# Patient Record
Sex: Female | Born: 1945 | Race: White | Hispanic: No | Smoking: Never smoker
Health system: Southern US, Community
[De-identification: ages and names within clinical notes are randomized; demographics above are authoritative.]

## PROBLEM LIST (undated history)

## (undated) DIAGNOSIS — F32A Depression, unspecified: Secondary | ICD-10-CM

## (undated) DIAGNOSIS — E538 Deficiency of other specified B group vitamins: Secondary | ICD-10-CM

## (undated) DIAGNOSIS — N200 Calculus of kidney: Secondary | ICD-10-CM

## (undated) DIAGNOSIS — F419 Anxiety disorder, unspecified: Secondary | ICD-10-CM

## (undated) DIAGNOSIS — G4730 Sleep apnea, unspecified: Secondary | ICD-10-CM

## (undated) DIAGNOSIS — F329 Major depressive disorder, single episode, unspecified: Secondary | ICD-10-CM

## (undated) DIAGNOSIS — Z9889 Other specified postprocedural states: Secondary | ICD-10-CM

## (undated) DIAGNOSIS — E785 Hyperlipidemia, unspecified: Secondary | ICD-10-CM

## (undated) HISTORY — PX: STONE EXTRACTION WITH BASKET: SHX5318

## (undated) HISTORY — DX: Major depressive disorder, single episode, unspecified: F32.9

## (undated) HISTORY — DX: Anxiety disorder, unspecified: F41.9

## (undated) HISTORY — DX: Hyperlipidemia, unspecified: E78.5

## (undated) HISTORY — DX: Depression, unspecified: F32.A

## (undated) HISTORY — DX: Deficiency of other specified B group vitamins: E53.8

---

## 1975-10-31 HISTORY — PX: TUBAL LIGATION: SHX77

## 1998-02-02 ENCOUNTER — Encounter: Admission: RE | Admit: 1998-02-02 | Discharge: 1998-05-03 | Payer: Self-pay | Admitting: Internal Medicine

## 1998-05-04 ENCOUNTER — Ambulatory Visit: Admission: RE | Admit: 1998-05-04 | Discharge: 1998-05-04 | Payer: Self-pay | Admitting: Obstetrics and Gynecology

## 1998-05-05 ENCOUNTER — Ambulatory Visit (HOSPITAL_COMMUNITY): Admission: RE | Admit: 1998-05-05 | Discharge: 1998-05-05 | Payer: Self-pay | Admitting: Obstetrics and Gynecology

## 1999-06-17 ENCOUNTER — Other Ambulatory Visit: Admission: RE | Admit: 1999-06-17 | Discharge: 1999-06-17 | Payer: Self-pay | Admitting: *Deleted

## 1999-07-08 ENCOUNTER — Ambulatory Visit (HOSPITAL_COMMUNITY): Admission: RE | Admit: 1999-07-08 | Discharge: 1999-07-08 | Payer: Self-pay | Admitting: Internal Medicine

## 1999-07-08 ENCOUNTER — Encounter: Payer: Self-pay | Admitting: Internal Medicine

## 2000-03-08 ENCOUNTER — Emergency Department (HOSPITAL_COMMUNITY): Admission: EM | Admit: 2000-03-08 | Discharge: 2000-03-08 | Payer: Self-pay | Admitting: Emergency Medicine

## 2000-03-09 ENCOUNTER — Encounter: Payer: Self-pay | Admitting: Emergency Medicine

## 2000-03-09 ENCOUNTER — Inpatient Hospital Stay (HOSPITAL_COMMUNITY): Admission: AD | Admit: 2000-03-09 | Discharge: 2000-03-11 | Payer: Self-pay | Admitting: Internal Medicine

## 2000-07-09 ENCOUNTER — Encounter: Payer: Self-pay | Admitting: Internal Medicine

## 2000-07-09 ENCOUNTER — Ambulatory Visit (HOSPITAL_COMMUNITY): Admission: RE | Admit: 2000-07-09 | Discharge: 2000-07-09 | Payer: Self-pay | Admitting: Internal Medicine

## 2000-07-18 ENCOUNTER — Other Ambulatory Visit: Admission: RE | Admit: 2000-07-18 | Discharge: 2000-07-18 | Payer: Self-pay | Admitting: *Deleted

## 2000-08-31 ENCOUNTER — Encounter: Admission: RE | Admit: 2000-08-31 | Discharge: 2000-11-29 | Payer: Self-pay | Admitting: Internal Medicine

## 2001-02-27 ENCOUNTER — Encounter: Payer: Self-pay | Admitting: Internal Medicine

## 2001-02-27 ENCOUNTER — Encounter: Admission: RE | Admit: 2001-02-27 | Discharge: 2001-02-27 | Payer: Self-pay | Admitting: Internal Medicine

## 2001-03-14 ENCOUNTER — Encounter: Payer: Self-pay | Admitting: Internal Medicine

## 2001-03-14 ENCOUNTER — Encounter: Admission: RE | Admit: 2001-03-14 | Discharge: 2001-03-14 | Payer: Self-pay | Admitting: Internal Medicine

## 2001-03-19 ENCOUNTER — Encounter: Admission: RE | Admit: 2001-03-19 | Discharge: 2001-05-29 | Payer: Self-pay | Admitting: Internal Medicine

## 2001-07-11 ENCOUNTER — Encounter: Payer: Self-pay | Admitting: Internal Medicine

## 2001-07-11 ENCOUNTER — Ambulatory Visit (HOSPITAL_COMMUNITY): Admission: RE | Admit: 2001-07-11 | Discharge: 2001-07-11 | Payer: Self-pay | Admitting: Internal Medicine

## 2001-07-18 ENCOUNTER — Other Ambulatory Visit: Admission: RE | Admit: 2001-07-18 | Discharge: 2001-07-18 | Payer: Self-pay | Admitting: Obstetrics and Gynecology

## 2002-07-15 ENCOUNTER — Ambulatory Visit (HOSPITAL_COMMUNITY): Admission: RE | Admit: 2002-07-15 | Discharge: 2002-07-15 | Payer: Self-pay | Admitting: Internal Medicine

## 2002-07-15 ENCOUNTER — Encounter: Payer: Self-pay | Admitting: Internal Medicine

## 2002-09-15 ENCOUNTER — Other Ambulatory Visit: Admission: RE | Admit: 2002-09-15 | Discharge: 2002-09-15 | Payer: Self-pay | Admitting: Obstetrics and Gynecology

## 2003-05-14 ENCOUNTER — Emergency Department (HOSPITAL_COMMUNITY): Admission: EM | Admit: 2003-05-14 | Discharge: 2003-05-14 | Payer: Self-pay | Admitting: Emergency Medicine

## 2003-05-14 ENCOUNTER — Encounter: Payer: Self-pay | Admitting: Emergency Medicine

## 2003-07-27 ENCOUNTER — Ambulatory Visit (HOSPITAL_COMMUNITY): Admission: RE | Admit: 2003-07-27 | Discharge: 2003-07-27 | Payer: Self-pay | Admitting: Internal Medicine

## 2003-07-27 ENCOUNTER — Encounter: Payer: Self-pay | Admitting: Internal Medicine

## 2003-11-19 ENCOUNTER — Encounter: Admission: RE | Admit: 2003-11-19 | Discharge: 2003-11-19 | Payer: Self-pay | Admitting: Internal Medicine

## 2004-07-13 ENCOUNTER — Ambulatory Visit (HOSPITAL_COMMUNITY): Admission: RE | Admit: 2004-07-13 | Discharge: 2004-07-14 | Payer: Self-pay | Admitting: Internal Medicine

## 2004-08-02 ENCOUNTER — Encounter: Admission: RE | Admit: 2004-08-02 | Discharge: 2004-10-31 | Payer: Self-pay | Admitting: Internal Medicine

## 2004-08-23 ENCOUNTER — Ambulatory Visit (HOSPITAL_COMMUNITY): Admission: RE | Admit: 2004-08-23 | Discharge: 2004-08-23 | Payer: Self-pay | Admitting: Internal Medicine

## 2004-08-26 ENCOUNTER — Ambulatory Visit (HOSPITAL_BASED_OUTPATIENT_CLINIC_OR_DEPARTMENT_OTHER): Admission: RE | Admit: 2004-08-26 | Discharge: 2004-08-26 | Payer: Self-pay | Admitting: Internal Medicine

## 2004-10-30 DIAGNOSIS — Z9889 Other specified postprocedural states: Secondary | ICD-10-CM

## 2004-10-30 HISTORY — DX: Other specified postprocedural states: Z98.890

## 2004-11-18 ENCOUNTER — Encounter: Admission: RE | Admit: 2004-11-18 | Discharge: 2004-11-18 | Payer: Self-pay | Admitting: Internal Medicine

## 2005-08-17 ENCOUNTER — Encounter: Admission: RE | Admit: 2005-08-17 | Discharge: 2005-10-29 | Payer: Self-pay | Admitting: Internal Medicine

## 2005-08-30 ENCOUNTER — Ambulatory Visit (HOSPITAL_COMMUNITY): Admission: RE | Admit: 2005-08-30 | Discharge: 2005-08-30 | Payer: Self-pay | Admitting: Internal Medicine

## 2005-10-24 ENCOUNTER — Emergency Department (HOSPITAL_COMMUNITY): Admission: EM | Admit: 2005-10-24 | Discharge: 2005-10-24 | Payer: Self-pay | Admitting: Emergency Medicine

## 2006-03-05 ENCOUNTER — Emergency Department (HOSPITAL_COMMUNITY): Admission: EM | Admit: 2006-03-05 | Discharge: 2006-03-05 | Payer: Self-pay | Admitting: Family Medicine

## 2006-08-31 ENCOUNTER — Ambulatory Visit (HOSPITAL_COMMUNITY): Admission: RE | Admit: 2006-08-31 | Discharge: 2006-08-31 | Payer: Self-pay | Admitting: Internal Medicine

## 2006-09-27 ENCOUNTER — Emergency Department (HOSPITAL_COMMUNITY): Admission: EM | Admit: 2006-09-27 | Discharge: 2006-09-27 | Payer: Self-pay | Admitting: Emergency Medicine

## 2006-10-15 ENCOUNTER — Ambulatory Visit (HOSPITAL_COMMUNITY): Admission: RE | Admit: 2006-10-15 | Discharge: 2006-10-15 | Payer: Self-pay | Admitting: Orthopaedic Surgery

## 2006-11-02 ENCOUNTER — Encounter: Admission: RE | Admit: 2006-11-02 | Discharge: 2007-01-31 | Payer: Self-pay | Admitting: Orthopaedic Surgery

## 2007-01-18 ENCOUNTER — Ambulatory Visit (HOSPITAL_COMMUNITY): Admission: RE | Admit: 2007-01-18 | Discharge: 2007-01-18 | Payer: Self-pay | Admitting: Orthopaedic Surgery

## 2007-02-15 ENCOUNTER — Other Ambulatory Visit: Admission: RE | Admit: 2007-02-15 | Discharge: 2007-02-15 | Payer: Self-pay | Admitting: Internal Medicine

## 2007-11-13 ENCOUNTER — Emergency Department (HOSPITAL_COMMUNITY): Admission: EM | Admit: 2007-11-13 | Discharge: 2007-11-13 | Payer: Self-pay | Admitting: Emergency Medicine

## 2008-02-03 ENCOUNTER — Ambulatory Visit (HOSPITAL_COMMUNITY): Admission: RE | Admit: 2008-02-03 | Discharge: 2008-02-03 | Payer: Self-pay | Admitting: Ophthalmology

## 2008-08-07 ENCOUNTER — Ambulatory Visit: Payer: Self-pay | Admitting: Internal Medicine

## 2008-08-21 ENCOUNTER — Ambulatory Visit: Payer: Self-pay | Admitting: Internal Medicine

## 2008-10-30 HISTORY — PX: CATARACT EXTRACTION W/ INTRAOCULAR LENS  IMPLANT, BILATERAL: SHX1307

## 2008-10-30 HISTORY — PX: OTHER SURGICAL HISTORY: SHX169

## 2008-11-17 ENCOUNTER — Emergency Department (HOSPITAL_COMMUNITY): Admission: EM | Admit: 2008-11-17 | Discharge: 2008-11-17 | Payer: Self-pay | Admitting: Family Medicine

## 2008-11-23 ENCOUNTER — Emergency Department (HOSPITAL_COMMUNITY): Admission: EM | Admit: 2008-11-23 | Discharge: 2008-11-23 | Payer: Self-pay | Admitting: Emergency Medicine

## 2008-12-17 ENCOUNTER — Ambulatory Visit: Payer: Self-pay | Admitting: Internal Medicine

## 2009-10-23 ENCOUNTER — Ambulatory Visit: Payer: Self-pay | Admitting: Diagnostic Radiology

## 2009-10-23 ENCOUNTER — Emergency Department (HOSPITAL_BASED_OUTPATIENT_CLINIC_OR_DEPARTMENT_OTHER): Admission: EM | Admit: 2009-10-23 | Discharge: 2009-10-23 | Payer: Self-pay | Admitting: Emergency Medicine

## 2009-10-25 ENCOUNTER — Ambulatory Visit: Payer: Self-pay | Admitting: Internal Medicine

## 2009-10-30 HISTORY — PX: LEG SURGERY: SHX1003

## 2009-12-02 ENCOUNTER — Ambulatory Visit: Payer: Self-pay | Admitting: Internal Medicine

## 2010-02-10 ENCOUNTER — Emergency Department (HOSPITAL_COMMUNITY): Admission: EM | Admit: 2010-02-10 | Discharge: 2010-02-10 | Payer: Self-pay | Admitting: Emergency Medicine

## 2010-02-14 ENCOUNTER — Ambulatory Visit: Payer: Self-pay | Admitting: Internal Medicine

## 2010-02-17 ENCOUNTER — Ambulatory Visit (HOSPITAL_BASED_OUTPATIENT_CLINIC_OR_DEPARTMENT_OTHER): Admission: RE | Admit: 2010-02-17 | Discharge: 2010-02-18 | Payer: Self-pay | Admitting: Orthopedic Surgery

## 2010-05-18 ENCOUNTER — Encounter: Admission: RE | Admit: 2010-05-18 | Discharge: 2010-08-15 | Payer: Self-pay | Admitting: Orthopedic Surgery

## 2010-11-20 ENCOUNTER — Encounter: Payer: Self-pay | Admitting: Internal Medicine

## 2010-12-05 ENCOUNTER — Other Ambulatory Visit: Payer: Commercial Managed Care - PPO | Admitting: Internal Medicine

## 2010-12-06 ENCOUNTER — Encounter (INDEPENDENT_AMBULATORY_CARE_PROVIDER_SITE_OTHER): Payer: Commercial Managed Care - PPO | Admitting: Internal Medicine

## 2010-12-06 DIAGNOSIS — F329 Major depressive disorder, single episode, unspecified: Secondary | ICD-10-CM

## 2010-12-06 DIAGNOSIS — E119 Type 2 diabetes mellitus without complications: Secondary | ICD-10-CM

## 2010-12-06 DIAGNOSIS — E785 Hyperlipidemia, unspecified: Secondary | ICD-10-CM

## 2011-01-03 ENCOUNTER — Ambulatory Visit (INDEPENDENT_AMBULATORY_CARE_PROVIDER_SITE_OTHER): Payer: Commercial Managed Care - PPO | Admitting: Internal Medicine

## 2011-01-03 DIAGNOSIS — F329 Major depressive disorder, single episode, unspecified: Secondary | ICD-10-CM

## 2011-01-03 DIAGNOSIS — E119 Type 2 diabetes mellitus without complications: Secondary | ICD-10-CM

## 2011-01-17 LAB — POCT I-STAT, CHEM 8
BUN: 18 mg/dL (ref 6–23)
Chloride: 110 mEq/L (ref 96–112)
Creatinine, Ser: 0.8 mg/dL (ref 0.4–1.2)
Potassium: 4 mEq/L (ref 3.5–5.1)
Sodium: 139 mEq/L (ref 135–145)

## 2011-01-17 LAB — GLUCOSE, CAPILLARY
Glucose-Capillary: 191 mg/dL — ABNORMAL HIGH (ref 70–99)
Glucose-Capillary: 229 mg/dL — ABNORMAL HIGH (ref 70–99)

## 2011-01-18 LAB — GLUCOSE, CAPILLARY: Glucose-Capillary: 181 mg/dL — ABNORMAL HIGH (ref 70–99)

## 2011-01-30 LAB — CBC
Platelets: 293 10*3/uL (ref 150–400)
RDW: 12.2 % (ref 11.5–15.5)
WBC: 8.3 10*3/uL (ref 4.0–10.5)

## 2011-01-30 LAB — GLUCOSE, CAPILLARY
Glucose-Capillary: 298 mg/dL — ABNORMAL HIGH (ref 70–99)
Glucose-Capillary: 394 mg/dL — ABNORMAL HIGH (ref 70–99)

## 2011-01-30 LAB — URINE CULTURE

## 2011-01-30 LAB — URINALYSIS, ROUTINE W REFLEX MICROSCOPIC
Hgb urine dipstick: NEGATIVE
Leukocytes, UA: NEGATIVE
Nitrite: NEGATIVE
Protein, ur: NEGATIVE mg/dL
Urobilinogen, UA: 0.2 mg/dL (ref 0.0–1.0)

## 2011-01-30 LAB — BASIC METABOLIC PANEL
BUN: 13 mg/dL (ref 6–23)
Calcium: 9 mg/dL (ref 8.4–10.5)
Creatinine, Ser: 0.7 mg/dL (ref 0.4–1.2)
GFR calc non Af Amer: >60 mL/min (ref >60)
Glucose, Bld: 384 mg/dL — ABNORMAL HIGH (ref 70–99)

## 2011-01-30 LAB — DIFFERENTIAL
Basophils Absolute: 0.1 10*3/uL (ref 0.0–0.1)
Eosinophils Relative: 1 % (ref 0–5)
Lymphocytes Relative: 21 % (ref 12–46)
Lymphs Abs: 1.7 10*3/uL (ref 0.7–4.0)
Neutro Abs: 5.6 10*3/uL (ref 1.7–7.7)
Neutrophils Relative %: 67 % (ref 43–77)

## 2011-01-30 LAB — URINE MICROSCOPIC-ADD ON

## 2011-02-09 ENCOUNTER — Other Ambulatory Visit: Payer: Commercial Managed Care - PPO | Admitting: Internal Medicine

## 2011-02-10 ENCOUNTER — Ambulatory Visit (INDEPENDENT_AMBULATORY_CARE_PROVIDER_SITE_OTHER): Payer: Commercial Managed Care - PPO | Admitting: Internal Medicine

## 2011-02-10 DIAGNOSIS — E119 Type 2 diabetes mellitus without complications: Secondary | ICD-10-CM

## 2011-03-17 NOTE — Discharge Summary (Signed)
NAME:  Diana George, Diana George                          ACCOUNT NO.:  [id number]   MEDICAL RECORD NO.:  [id number]                   PATIENT TYPE:  OIB   LOCATION:  3742                                 FACILITY:  [facility]   PHYSICIAN:  Doylene Canning. Ladona Ridgel, M.D.               DATE OF BIRTH:  06/02/1946   DATE OF ADMISSION:  07/13/2004  DATE OF DISCHARGE:  07/14/2004                                 DISCHARGE SUMMARY   DISCHARGE DIAGNOSES:  1.  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  2.  Electrophysiology surgery with radiofrequency ablation of the      atrioventricular node reentrant tachycardia with successful P wave      modification, rendering supraventricular tachycardia non-inducible.   SECONDARY DIAGNOSIS:  Dyslipidemia.   PROCEDURE:  July 13, 2004, radiofrequency ablation of atrioventricular  nodal reentrant tachycardia with P wave modification, supraventricular  tachycardia non-inducible at end of study, Dr. Doylene Canning. Ladona Ridgel,  electrophysiologist.   DISCHARGE DISPOSITION:  Diana George is ready for discharge, post-  procedure day #1.  She has had no recurrence of tachycardia since the  procedure.  She is feeling well.   DISCHARGE MEDICATIONS:  She goes home with:  1.  Enteric-coated aspirin 81 mg daily.  2.  Clorazepate 7.5 mg daily.  3.  Zoloft 100 mg daily.  4.  Multivitamin daily.   ACTIVITY:  She is asked not to engage in any heavy lifting or straining for  the next 2 days.   DISCHARGE INSTRUCTIONS:  If she plans a dental visit or even a teeth-  cleaning or minor surgery before January 10, 2005, she is to call [phone] for  antibiotic coverage.   PAIN MANAGEMENT:  Tylenol for pain at the catheterization site, 1-2 tabs  every 4-6 hours as needed.   SPECIAL DISCHARGE INSTRUCTIONS:  She is asked not to drive until Saturday,  July 16, 2004.  She will go back to work on Monday, July 18, 2004.   FOLLOWUP:  She has a followup visit with Dr.  Ladona Ridgel, Thursday, August 25, 2004, at 10:45 in the morning.   BRIEF HISTORY:  Diana George is a 65 year old female.  She has a history of  recurrent tachy-palpitations which have stretched back to when she was in  college.  At that time, she noted she had palpitations; she would lie down,  raise her legs and tachy-palpitations would respond immediately and  terminate.  Over the years, she has had recurrent episodes, but always self-  terminating.  Several weeks ago, she had a recurrence, but did not terminate  spontaneously or with vagal maneuvers.  She went to the emergency room and  underwent IV adenosine infusion, which terminated the tachycardia.  Since  then, the patient has been given Lopressor to take as needed.  Since this  visit to the emergency room, she has had no additional prolonged tachy-  palpitation.  She is  now referred for additional evaluation.  Treatment  options have been discussed with the patient by Dr. Ladona Ridgel including  catheter ablation;  the patient will decide whether continued medical  therapy with Lopressor or ablation will be best suitable for her.   HOSPITAL COURSE:  The patient presents electively, July 13, 2004, for  radiofrequency ablation of an A-V node reentrant tachycardia; this was done  successfully by Dr. Ladona Ridgel.  She has had no post-procedure complications, no  recurrence of SVT.      Diana George, P.A.                    Doylene Canning. Ladona Ridgel, M.D.    GM/MEDQ  D:  07/14/2004  T:  07/14/2004  Job:  [id number]   cc:   Luanna Cole. Lenord Fellers, M.D.  [address] [place]  [place]  [place] [address]  Fax: [phone]

## 2011-03-17 NOTE — Procedures (Signed)
NAME:  Diana George, Diana George                ACCOUNT NO.:  [id number]   MEDICAL RECORD NO.:  [id number]          PATIENT TYPE:  OUT   LOCATION:  SLEEP CENTER                 FACILITY[facility]   PHYSICIAN:  Clinton D. Maple Hudson, M.D. DATE OF BIRTH:  Jun 07, 1946   DATE OF STUDY:  08/26/2004                              NOCTURNAL POLYSOMNOGRAM   STUDY DATE:  August 26, 2004   REFERRING PHYSICIAN:  Clinton D. Maple Hudson, M.D.   INDICATION FOR STUDY:  Hypersomnia with sleep apnea.   EPWORTH SLEEPINESS SCORE:  11/24   BODY MASS INDEX:  29.9   WEIGHT:  191 pounds   SLEEP ARCHITECTURE:  Total sleep time 365 minutes with sleep efficiency 85%.  Stage I was 13%, stage II 85%, stages III and IV were absent, REM was 2% of  total sleep time.  Sleep latency 10 minutes, REM latency 398 minutes, awake  after sleep onset 56 minutes.  Arousal index 25.8 which is increased and  reflected both respiratory events and leg jerks.  She listed medications  including clorazepate and Zoloft but technician did not indicate if these  were taken the night of the study.   RESPIRATORY DATA:  RDI 13.8/hr consistent with mild obstructive sleep  apnea/hypopnea syndrome.  This reflected 19 obstructive apneas and 65  hypopneas.  Events were not positional.  REM RDI 6.7/hr.   OXYGEN DATA:  Variable snoring with desaturation to a nadir of 85% with  apneas.  Mean oxygen saturation through the study was 95-96% on room air.   CARDIAC DATA:  Normal sinus rhythm.   MOVEMENT/PARASOMNIA:  Periodic limb movement syndrome with arousal indicated  by a total of 280 limb jerks of which 42 were associated with arousal or  awakening for a periodic limb movement with arousal index of 6.9/hr.   IMPRESSION/RECOMMENDATION:  Mild obstructive sleep apnea/hypopnea syndrome,  respiratory disturbance index 13.8/hr with desaturation to 85%.  She had not  met protocol guideline for split-study protocol.  Periodic limb movement  with arousal,  6.9/hr.                                                           Joni Fears D. Maple Hudson, M.D.  Diplomate, [employer] Board   CDY/MEDQ  D:  09/04/2004 11:00:50  T:  09/05/2004 10:21:55  Job:  [id number]

## 2011-03-17 NOTE — Op Note (Signed)
NAME:  Diana George, Diana George                          ACCOUNT NO.:  [id number]   MEDICAL RECORD NO.:  [id number]                   PATIENT TYPE:  OIB   LOCATION:  2856                                 FACILITY:  [facility]   PHYSICIAN:  Doylene Canning. Ladona Ridgel, M.D.               DATE OF BIRTH:  1946-09-06   DATE OF PROCEDURE:  07/13/2004  DATE OF DISCHARGE:                                 OPERATIVE REPORT   PROCEDURE PERFORMED:  Electrophysiologic study and radio frequency catheter  ablation of atrioventricular node re-entrant tachycardia.   INTRODUCTION:  The patient is a 65 year old woman with a history of  recurrent tachy palpitations for several years.  They start and stop  suddenly and have been documented and demonstrated to be due to a narrow QRS  tachycardia at 180 beats per minute.  This tachycardia typically stops with  adenosine.  The patient has done well for over a year, but has over the last  several weeks developed recurrent episodes of tachy palpitations having  seven distinct episodes occur in the last several days.  She is now referred  for electrophysiologic study and catheter ablation.   DESCRIPTION OF PROCEDURE:  After informed consent was obtained, the patient  was taken to the diagnostic electrophysiology laboratory in a fasted state.  After the usual preparation and draping, intravenous fentanyl and Midazolam  was given for sedation.  A 6 French hexapolar catheter was inserted  percutaneously into the right jugular vein and advanced to the coronary  sinus.  A 5 French quadripolar catheter was inserted percutaneously into the  right femoral vein and advanced to the right ventricular apex.  A 5 French  quadripolar catheter was inserted percutaneously into the right femoral vein  and advanced to the His bundle region.  After measurement of the basic  intervals, rapid ventricular pacing was carried out from the right  ventricular apex demonstrating a VA Wenckebach cycle length of 340  msec.  During rapid ventricular pacing, the atrial activation was midline and  decremental.  Next, programmed ventricular stimulation was carried out from  the RV apex at a basic drive cycle length of 161 msec.  The S1-S2 interval  was stepwise decreased from 440 msec down to 250 msec where ventricular  refractoriness was observed.  During programmed ventricular stimulation, the  atrial activation was midline and decremental.  Next, programmed atrial  stimulation was carried out from the coronary sinus as well as the high  right atrium with basic drive cycle lengths of 096 and 400 msec.  The S1-S2  interval was stepwise decreased from 440 msec down to 260 msec where atrial  refractoriness was observed.  During programmed atrial stimulation there  were multiple AH jumps and echo beats but initially no inducible SVT.  Rapid  atrial pacing was carried out from the coronary sinus as well as the high  right atrium with pace cycle lengths of 600  msec and stepwise decreased down  to 390 msec.  During rapid atrial pacing the P-R interval was greater than  the R-R interval but there was no inducible SVT.  At this point  isoproterenol at concentrations of 1 to 2 mcg per minute were then infused  and additional rapid atrial pacing was carried out which resulted in the  initiation of SVT.  During SVT the cycle length was 334 msec.  After some  time, the cycle length accelerated to 303 msec.  PVCs were placed at the  time of His bundle refractoriness during tachycardia demonstrating no atrial  pre excitation.  In addition there was a VAV conduction sequence in  tachycardia.  Finally mapping demonstrated the earliest atrial activation  occurring in the his bundle region.  The VA interval was essentially 0.  All  of the above findings demonstrated a diagnosis of AV node re-entry  tachycardia.  The ablation catheter was maneuvered into the usual region of  Koch's triangle.  A total of 4 radio frequency  energy applications were  delivered to sites 6 through 8 in Koch's triangle resulting in creation of  accelerated junctional rhythm.  Following this, additional isoproterenol was  infused and additional rapid atrial and ventricular pacing carried out and  this demonstrated no evidence of inducible tachycardia.  In addition, there  was no evidence of any residual slow pathway conduction.  At this point the  catheters were removed.  Hemostasis was assured and the patient was returned  to her room in satisfactory condition.   COMPLICATIONS:  There were no immediate procedural complications.   RESULTS:  a.  Baseline electrocardiogram.  The baseline ECG demonstrates  normal sinus rhythm with normal axis and intervals.  b.  Baseline intervals.  The P-R interval was 150 msec.  The QRS duration  980 msec.  The HV interval 49 msec.  c.  Rapid ventricular pacing.  Rapid ventricular pacing was carried out from  the right ventricular apex demonstrating a VA Wenckebach cycle length of 340  msec.  During rapid ventricular pacing the atrial activation was midline and  decremental.  d.  Programmed ventricular stimulation.  Programmed ventricular stimulation  was carried out from the RV apex at basic drive cycle lengths of 478 msec.  The S1-S2 interval was stepwise decreased down to 250 msec resulting in  ventricular refractoriness.  During programmed ventricular stimulation, the  atrial activation was midline and decremental.  e.  Rapid atrial pacing.  Rapid atrial pacing was carried out from the high  right atrium as well as the coronary sinus with pace cycle length of 600  msec, stepwise decreased down to AV Wenckebach. During rapid atrial pacing  with isoproterenol there was inducible SVT as previously described.  f.  Programmed atrial stimulation.  Programmed atrial stimulation was  carried out from the coronary sinus as well as the high right atrium with basic drive cycle length of 295 msec.  The  S1-S2 interval was stepwise  decreased from 440 msec down to 260 msec resulting in atrial refractoriness.  During programmed atrial stimulation there were multiple A-H jumps and echo  beats.  Following catheter ablation there were neither A-H jumps or echo  beats remaining.  g.  Arrhythmias observed.  AV node re-entry tachycardia.  Initiation rapid  atrial pacing on isoproterenol.  Duration was sustained.  Cycle length was  300 to 330 msec.  h.  Mapping.  Mapping of Koch's triangle demonstrated the usual size and  orientation.  1.  Radio frequency energy application.  A total of four radio frequency      energy applications were delivered to the usual sites in Koch's triangle      between sites 6 and 8.  During RF energy application, there was      accelerated junctional rhythm.  Following ablation there was no residual      slow pathway conduction.   CONCLUSION:  This study demonstrated successful electrophysiologic study and  radio frequency catheter ablation of AV node re-entry tachycardia with a  total of four RF energy applications delivered to sites 6 through 8 in  Koch's triangle, resulting in rendering of the tachycardia noninducible.                                               Doylene Canning. Ladona Ridgel, M.D.    GWT/MEDQ  D:  07/13/2004  T:  07/13/2004  Job:  [id number]   cc:   Charlton Haws, M.D.   Luanna Cole. Lenord Fellers, M.D.  [address], [place]  [place]  [place] [address]  Fax: [phone]

## 2011-04-16 ENCOUNTER — Inpatient Hospital Stay (INDEPENDENT_AMBULATORY_CARE_PROVIDER_SITE_OTHER)
Admission: RE | Admit: 2011-04-16 | Discharge: 2011-04-16 | Disposition: A | Discharge status: Home / Self Care | Payer: Commercial Managed Care - PPO | Source: Ambulatory Visit | Attending: Family Medicine | Admitting: Family Medicine

## 2011-04-16 DIAGNOSIS — J309 Allergic rhinitis, unspecified: Secondary | ICD-10-CM

## 2011-05-30 ENCOUNTER — Other Ambulatory Visit: Payer: Self-pay | Admitting: Internal Medicine

## 2011-06-09 ENCOUNTER — Encounter: Payer: Self-pay | Admitting: Internal Medicine

## 2011-06-12 ENCOUNTER — Other Ambulatory Visit: Payer: Commercial Managed Care - PPO | Admitting: Internal Medicine

## 2011-06-12 DIAGNOSIS — E785 Hyperlipidemia, unspecified: Secondary | ICD-10-CM

## 2011-06-13 LAB — HEPATIC FUNCTION PANEL
Albumin: 4.3 g/dL (ref 3.5–5.2)
Alkaline Phosphatase: 77 U/L (ref 39–117)
Bilirubin, Direct: 0.1 mg/dL (ref 0.0–0.3)
Total Bilirubin: 0.4 mg/dL (ref 0.3–1.2)

## 2011-06-13 LAB — HEMOGLOBIN A1C: Hgb A1c MFr Bld: 6.6 % — ABNORMAL HIGH (ref <5.7)

## 2011-06-13 LAB — LIPID PANEL
HDL: 43 mg/dL (ref >39)
LDL Cholesterol: 88 mg/dL (ref 0–99)

## 2011-06-16 ENCOUNTER — Ambulatory Visit (INDEPENDENT_AMBULATORY_CARE_PROVIDER_SITE_OTHER): Payer: Commercial Managed Care - PPO | Admitting: Internal Medicine

## 2011-06-16 ENCOUNTER — Encounter: Payer: Self-pay | Admitting: Internal Medicine

## 2011-06-16 DIAGNOSIS — F419 Anxiety disorder, unspecified: Secondary | ICD-10-CM | POA: Insufficient documentation

## 2011-06-16 DIAGNOSIS — E119 Type 2 diabetes mellitus without complications: Secondary | ICD-10-CM | POA: Insufficient documentation

## 2011-06-16 DIAGNOSIS — E785 Hyperlipidemia, unspecified: Secondary | ICD-10-CM

## 2011-06-16 DIAGNOSIS — F341 Dysthymic disorder: Secondary | ICD-10-CM

## 2011-06-16 DIAGNOSIS — F329 Major depressive disorder, single episode, unspecified: Secondary | ICD-10-CM

## 2011-06-16 DIAGNOSIS — F32A Depression, unspecified: Secondary | ICD-10-CM

## 2011-06-16 NOTE — Progress Notes (Signed)
  Subjective:    Patient ID: Diana George, female    DOB: 04-26-1946, 65 y.o.   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 Has been going to [facility] at the hospital for dietary counseling and management of her diabetes. Hemoglobin A1c today is 6.6% which is markedly improved from 7.3% in April and previously 10.6% February 2012. Lipids are now normal. Liver functions are normal. She's compliant with her medications. She feels better. Elderly parents are in assisted living. Both parents have been diagnosed with dementia and this is stressful. She is going to retire January 2013.    Review of Systems     Objective:   Physical Exam chest clear to auscultation, cardiac exam regular rate and rhythm normal S1 and S2; extremities without edema. No diabetic foot issues. Pulses are normal.   Reminded about diabetic eye exam     Assessment & Plan:  AODM  Hyperlipidemia  Anxiety depression  Return in 4 months for fasting lipid panel liver functions and hemoglobin A1c along with office visit

## 2011-07-17 ENCOUNTER — Other Ambulatory Visit: Payer: Self-pay | Admitting: Internal Medicine

## 2011-09-06 ENCOUNTER — Other Ambulatory Visit: Payer: Self-pay | Admitting: Internal Medicine

## 2011-09-11 ENCOUNTER — Other Ambulatory Visit: Payer: Self-pay

## 2011-09-11 MED ORDER — CLORAZEPATE DIPOTASSIUM 3.75 MG PO TABS: 3.7500 mg | ORAL_TABLET | Freq: Two times a day (BID) | ORAL | Status: DC

## 2011-10-26 ENCOUNTER — Other Ambulatory Visit: Payer: Commercial Managed Care - PPO | Admitting: Internal Medicine

## 2011-10-26 DIAGNOSIS — Z79899 Other long term (current) drug therapy: Secondary | ICD-10-CM

## 2011-10-26 DIAGNOSIS — E785 Hyperlipidemia, unspecified: Secondary | ICD-10-CM

## 2011-10-26 LAB — HEPATIC FUNCTION PANEL
ALT: 11 U/L (ref 0–35)
AST: 15 U/L (ref 0–37)
Albumin: 4.4 g/dL (ref 3.5–5.2)
Total Bilirubin: 0.5 mg/dL (ref 0.3–1.2)

## 2011-10-26 LAB — LIPID PANEL
HDL: 48 mg/dL (ref >39)
Total CHOL/HDL Ratio: 2.8 Ratio
VLDL: 18 mg/dL (ref 0–40)

## 2011-10-26 LAB — HEMOGLOBIN A1C: Hgb A1c MFr Bld: 6.1 % — ABNORMAL HIGH (ref <5.7)

## 2011-10-27 ENCOUNTER — Ambulatory Visit (INDEPENDENT_AMBULATORY_CARE_PROVIDER_SITE_OTHER): Payer: Commercial Managed Care - PPO | Admitting: Internal Medicine

## 2011-10-27 ENCOUNTER — Encounter: Payer: Self-pay | Admitting: Internal Medicine

## 2011-10-27 DIAGNOSIS — F341 Dysthymic disorder: Secondary | ICD-10-CM

## 2011-10-27 DIAGNOSIS — F329 Major depressive disorder, single episode, unspecified: Secondary | ICD-10-CM

## 2011-10-27 DIAGNOSIS — F32A Depression, unspecified: Secondary | ICD-10-CM

## 2011-10-27 DIAGNOSIS — E119 Type 2 diabetes mellitus without complications: Secondary | ICD-10-CM

## 2011-10-27 DIAGNOSIS — E785 Hyperlipidemia, unspecified: Secondary | ICD-10-CM

## 2011-10-28 NOTE — Progress Notes (Signed)
  Subjective:    Patient ID: Diana George, female    DOB: May 21, 1946, 65 y.o.   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 She has been doing better with regard to diet. Am pleased with her results today. These were reviewed with her. Lipid panel is entirely normal. Hemoglobin A1c has improved significantly. Still has issues taking care of elderly parents. She will be retiring soon from her job and hopefully will have more time to take care of herself. However has made very good progress in the past 4 months. Says she's making healthy diet choices.    Review of Systems     Objective:   Physical Exam chest clear to auscultation; cardiac exam: regular rate and rhythm; extremities: without edema. Diabetic foot exam: shows no ulcers and no evidence of fungal infection. Has dry heels. Have recommended moisturizer for the dry heels        Assessment & Plan:  Diabetes mellitus  Hyperlipidemia  Anxiety depression  Plan: Urine sent for microalbumin; has seen Dr. Dagoberto Ligas for diabetic eye exam 2012; had influenza immunization at work. Tdap vaccine given today. Prescription given for Zostavax vaccine to be obtained at drugstore. Return in 6 months for physical exam.

## 2011-10-28 NOTE — Patient Instructions (Signed)
Please keep up the good work with diet and exercise. Am pleased with your progress with regard to diabetic control and lipid management. Return in 6 months for physical exam. Congratulations on your retirement.

## 2011-11-12 DIAGNOSIS — F411 Generalized anxiety disorder: Secondary | ICD-10-CM

## 2011-11-12 DIAGNOSIS — E785 Hyperlipidemia, unspecified: Secondary | ICD-10-CM

## 2011-11-12 DIAGNOSIS — E119 Type 2 diabetes mellitus without complications: Secondary | ICD-10-CM

## 2011-11-13 ENCOUNTER — Telehealth: Payer: Self-pay

## 2011-11-13 DIAGNOSIS — F329 Major depressive disorder, single episode, unspecified: Secondary | ICD-10-CM

## 2011-11-13 DIAGNOSIS — E119 Type 2 diabetes mellitus without complications: Secondary | ICD-10-CM

## 2011-11-13 DIAGNOSIS — I10 Essential (primary) hypertension: Secondary | ICD-10-CM

## 2011-11-14 ENCOUNTER — Other Ambulatory Visit: Payer: Self-pay

## 2011-11-14 MED ORDER — BUPROPION HCL ER (XL) 150 MG PO TB24: 150.0000 mg | ORAL_TABLET | Freq: Every day | ORAL | Status: DC

## 2011-11-14 MED ORDER — SIMVASTATIN 20 MG PO TABS: 20.0000 mg | ORAL_TABLET | Freq: Every day | ORAL | Status: DC

## 2011-11-14 MED ORDER — CLORAZEPATE DIPOTASSIUM 7.5 MG PO TABS: 7.5000 mg | ORAL_TABLET | Freq: Every day | ORAL | Status: DC

## 2011-11-14 MED ORDER — METFORMIN HCL 500 MG PO TABS: 500.0000 mg | ORAL_TABLET | Freq: Two times a day (BID) | ORAL | Status: DC

## 2011-11-14 MED ORDER — SITAGLIPTIN PHOSPHATE 100 MG PO TABS: 100.0000 mg | ORAL_TABLET | Freq: Every day | ORAL | Status: DC

## 2011-11-14 MED ORDER — SERTRALINE HCL 100 MG PO TABS: 100.0000 mg | ORAL_TABLET | Freq: Every day | ORAL | Status: DC

## 2011-11-14 MED ORDER — CLORAZEPATE DIPOTASSIUM 3.75 MG PO TABS: 3.7500 mg | ORAL_TABLET | Freq: Two times a day (BID) | ORAL | Status: DC

## 2011-11-14 NOTE — Telephone Encounter (Signed)
Medications refilled

## 2011-12-20 ENCOUNTER — Telehealth: Payer: Self-pay | Admitting: Internal Medicine

## 2011-12-21 ENCOUNTER — Other Ambulatory Visit: Payer: Self-pay

## 2011-12-21 MED ORDER — METFORMIN HCL 500 MG PO TABS: 500.0000 mg | ORAL_TABLET | Freq: Two times a day (BID) | ORAL | Status: DC

## 2011-12-21 MED ORDER — GLUCOSE BLOOD VI STRP: ORAL_STRIP | Status: DC

## 2011-12-21 NOTE — Telephone Encounter (Signed)
Refills of Metformin and diabetic test strips sent to [facility]

## 2012-02-26 ENCOUNTER — Ambulatory Visit (INDEPENDENT_AMBULATORY_CARE_PROVIDER_SITE_OTHER): Payer: Medicare Other | Admitting: Internal Medicine

## 2012-02-26 ENCOUNTER — Encounter: Payer: Self-pay | Admitting: Internal Medicine

## 2012-02-26 VITALS — BP 110/72 | HR 76 | Temp 99.3°F | Wt 181.0 lb

## 2012-02-26 DIAGNOSIS — E119 Type 2 diabetes mellitus without complications: Secondary | ICD-10-CM

## 2012-02-26 DIAGNOSIS — J329 Chronic sinusitis, unspecified: Secondary | ICD-10-CM

## 2012-02-26 NOTE — Patient Instructions (Signed)
take Levaquin 500 mg daily for 7 days. If not better in 7 days have prescription refilled.

## 2012-02-26 NOTE — Progress Notes (Signed)
  Subjective:    Patient ID: Diana George, female    DOB: 01-06-1946, 66 y.o.   MRN: [id number]  HPI Patient has near one-week history of URI symptoms. Initially started out with clear rhinorrhea. Now sounds nasally congested. No cough. Has malaise and fatigue. No fever or chills. She is now retired. However her elderly parents her taking up most of her time. They have lots of medical problems and issues.    Review of Systems     Objective:   Physical Exam HEENT exam: Pharynx is clear. Left TM full but not red. Right TM slightly full. Neck is supple without significant adenopathy. Chest clear. Sounds nasally congested.        Assessment & Plan:  Sinusitis  History of diabetes mellitus-patient says glucoses have been running excellent recently.  Plan: Levaquin 500 milligrams daily for 7 days with one refill. If not better in one week have prescription refill.

## 2012-04-25 ENCOUNTER — Other Ambulatory Visit: Payer: Commercial Managed Care - PPO | Admitting: Internal Medicine

## 2012-04-26 ENCOUNTER — Encounter: Payer: Commercial Managed Care - PPO | Admitting: Internal Medicine

## 2012-05-14 ENCOUNTER — Other Ambulatory Visit: Payer: Self-pay

## 2012-05-14 ENCOUNTER — Telehealth: Payer: Self-pay | Admitting: Internal Medicine

## 2012-05-14 MED ORDER — GLUCOSE BLOOD VI STRP: ORAL_STRIP | Status: DC

## 2012-05-14 NOTE — Telephone Encounter (Signed)
Please call this in for bid testing for one year

## 2012-06-03 ENCOUNTER — Other Ambulatory Visit: Payer: Medicare Other | Admitting: Internal Medicine

## 2012-06-03 DIAGNOSIS — E785 Hyperlipidemia, unspecified: Secondary | ICD-10-CM

## 2012-06-03 DIAGNOSIS — E119 Type 2 diabetes mellitus without complications: Secondary | ICD-10-CM

## 2012-06-03 LAB — HEMOGLOBIN A1C: Hgb A1c MFr Bld: 6.2 % — ABNORMAL HIGH (ref <5.7)

## 2012-06-03 LAB — TSH: TSH: 1.808 u[IU]/mL (ref 0.350–4.500)

## 2012-06-03 LAB — COMPREHENSIVE METABOLIC PANEL
ALT: 10 U/L (ref 0–35)
AST: 14 U/L (ref 0–37)
Creat: 1.05 mg/dL (ref 0.50–1.10)
Total Bilirubin: 0.7 mg/dL (ref 0.3–1.2)

## 2012-06-03 LAB — LIPID PANEL
Cholesterol: 146 mg/dL (ref 0–200)
Total CHOL/HDL Ratio: 3.6 Ratio
VLDL: 29 mg/dL (ref 0–40)

## 2012-06-04 ENCOUNTER — Encounter: Payer: Self-pay | Admitting: Internal Medicine

## 2012-06-04 ENCOUNTER — Ambulatory Visit (INDEPENDENT_AMBULATORY_CARE_PROVIDER_SITE_OTHER): Payer: Medicare Other | Admitting: Internal Medicine

## 2012-06-04 VITALS — BP 122/74 | HR 80 | Temp 98.4°F | Ht 66.0 in | Wt 180.0 lb

## 2012-06-04 DIAGNOSIS — J309 Allergic rhinitis, unspecified: Secondary | ICD-10-CM

## 2012-06-04 DIAGNOSIS — F419 Anxiety disorder, unspecified: Secondary | ICD-10-CM

## 2012-06-04 DIAGNOSIS — F439 Reaction to severe stress, unspecified: Secondary | ICD-10-CM

## 2012-06-04 DIAGNOSIS — F32A Depression, unspecified: Secondary | ICD-10-CM

## 2012-06-04 DIAGNOSIS — Z0000 Encounter for general adult medical examination without abnormal findings: Secondary | ICD-10-CM

## 2012-06-04 DIAGNOSIS — F341 Dysthymic disorder: Secondary | ICD-10-CM

## 2012-06-04 DIAGNOSIS — E785 Hyperlipidemia, unspecified: Secondary | ICD-10-CM

## 2012-06-04 DIAGNOSIS — F430 Acute stress reaction: Secondary | ICD-10-CM

## 2012-06-04 DIAGNOSIS — E119 Type 2 diabetes mellitus without complications: Secondary | ICD-10-CM

## 2012-06-04 LAB — CBC WITH DIFFERENTIAL/PLATELET
Basophils Absolute: 0 10*3/uL (ref 0.0–0.1)
Basophils Relative: 0 % (ref 0–1)
Eosinophils Absolute: 0.2 10*3/uL (ref 0.0–0.7)
Eosinophils Relative: 3 % (ref 0–5)
MCH: 30.3 pg (ref 26.0–34.0)
MCV: 94.5 fL (ref 78.0–100.0)
Platelets: 359 10*3/uL (ref 150–400)
RDW: 13.4 % (ref 11.5–15.5)

## 2012-06-04 LAB — POCT URINALYSIS DIPSTICK
Ketones, UA: NEGATIVE
Spec Grav, UA: 1.025

## 2012-07-16 ENCOUNTER — Other Ambulatory Visit: Payer: Self-pay

## 2012-07-16 MED ORDER — METFORMIN HCL 500 MG PO TABS: 500.0000 mg | ORAL_TABLET | Freq: Two times a day (BID) | ORAL | Status: DC

## 2012-08-09 ENCOUNTER — Ambulatory Visit (INDEPENDENT_AMBULATORY_CARE_PROVIDER_SITE_OTHER): Payer: Medicare Other | Admitting: Internal Medicine

## 2012-08-09 DIAGNOSIS — Z23 Encounter for immunization: Secondary | ICD-10-CM

## 2012-08-24 ENCOUNTER — Encounter: Payer: Self-pay | Admitting: Internal Medicine

## 2012-08-24 NOTE — Progress Notes (Signed)
  Subjective:    Patient ID: Diana George, female    DOB: [birth year], 66 y.o.   MRN: [id number]  HPI for influenza vaccine today by nurse    Review of Systems     Objective:   Physical Exam        Assessment & Plan:  Flu vaccine administered. Patient not seen by M.D.

## 2012-08-24 NOTE — Patient Instructions (Addendum)
Flu shot administered.

## 2012-09-28 NOTE — Progress Notes (Signed)
Subjective:    Patient ID: Diana George, female    DOB: 06-10-1946, 66 y.o.   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 Still has a lot of stress with her parents who live out-of-town , have dementia, and need a lot of assistance. Patient has a history of diabetes,  depression and anxiety, hyperlipidemia. Has begun to take better care of herself over the past few months. For a time in  2012, she was overwhelmed with parents' care and really didn't take very good care of herself. Patient is divorced and lives alone.    Patient had a fractured left wrist in November 2007 do to a fall in addition to a right anterior cruciate ligament tear and meniscal tear.  Bilateral tubal ligation 1980  Stone basket extraction for kidney stone mid 1970s  Cataract extraction left eye March 2011.  She is allergic to amoxicillin it causes a rash.   Reminded annual exam. Colonoscopy hasn't been done by Dr. Lina Sar. Had Pneumovax in 2011. Gets annual influenza immunization.Tdap given in 2012.  She has 2 adult children. She is an only child.  Patient does not smoke or consume alcohol.  Patient has a normal Cardiolite study in 2002. She had an echocardiogram at that time. This was for syncope. 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 Was seen by Dr. 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 She had catheter ablation in September 2005.  Had allergy evaluation in 2002 with significant reactions to various tree pollens, house dust dust mites. Mild reaction to molds. No food allergies. Spirometry at that time was normal.   Review of Systems  Constitutional: Positive for fatigue.  HENT: Positive for congestion.   Eyes: Negative.   Respiratory: Negative.   Cardiovascular: Negative.   Gastrointestinal: Negative.   Genitourinary: Negative.   Musculoskeletal:  Positive for arthralgias.  Neurological: Negative.   Hematological: Negative.   Psychiatric/Behavioral: Positive for dysphoric mood.       Objective:   Physical Exam  Vitals reviewed. Constitutional: She is oriented to person, place, and time. She appears well-developed and well-nourished. No distress.  HENT:  Head: Normocephalic and atraumatic.  Right Ear: External ear normal.  Left Ear: External ear normal.  Mouth/Throat: Oropharynx is clear and moist. No oropharyngeal exudate.  Eyes: Conjunctivae normal and EOM are normal. Pupils are equal, round, and reactive to light. Right eye exhibits no discharge. Left eye exhibits no discharge. No scleral icterus.  Neck: Neck supple. No JVD present. No thyromegaly present.  Cardiovascular: Normal rate, regular rhythm and normal heart sounds.   No murmur heard. Pulmonary/Chest: Effort normal and breath sounds normal. No respiratory distress. She has no wheezes. She has no rales.       Breasts normal female  Abdominal: Soft. Bowel sounds are normal. She exhibits no distension and no mass. There is no tenderness. There is no rebound.  Musculoskeletal: Normal range of motion. She exhibits no edema.  Lymphadenopathy:    She has no cervical adenopathy.  Neurological: She is alert and oriented to person, place, and time. She has normal reflexes. No cranial nerve deficit. Coordination normal.  Skin: Skin is warm and dry. No rash noted. She is not diaphoretic.  Psychiatric: She has a normal mood and affect. Her behavior is normal. Judgment and thought content normal.          Assessment &  Plan:  Type 2 diabetes mellitus  Hyperlipidemia  Anxiety depression  Plan: Patient is to continue same medication and return in 6 months. Suggested counseling to help her deal with parents' issues  Subjective:   Patient presents for Medicare Annual/Subsequent preventive examination. See EPIC  Review Past Medical/Family/Social: see EPIC   Risk  Factors  Current exercise habits: sedentary- needs to walk Dietary issues discussed: low fat low carb  Cardiac risk factors:  Depression Screen  (Note: if answer to either of the following is "Yes", a more complete depression screening is indicated)   Over the past two weeks, have you felt down, depressed or hopeless? yes Over the past two weeks, have you felt little interest or pleasure in doing things? yes Have you lost interest or pleasure in daily life? yes Do you often feel hopeless? No Do you cry easily over simple problems? No   Activities of Daily Living  In your present state of health, do you have any difficulty performing the following activities?:   Driving? No  Managing money? No  Feeding yourself? No  Getting from bed to chair? No  Climbing a flight of stairs? No  Preparing food and eating?: No  Bathing or showering? No  Getting dressed: No  Getting to the toilet? No  Using the toilet:No  Moving around from place to place: No  In the past year have you fallen or had a near fall?:No  Are you sexually active? No  Do you have more than one partner? No   Hearing Difficulties: No  Do you often ask people to speak up or repeat themselves? No  Do you experience ringing or noises in your ears? No  Do you have difficulty understanding soft or whispered voices? No  Do you feel that you have a problem with memory? No Do you often misplace items? No    Home Safety:  Do you have a smoke alarm at your residence? Yes Do you have grab bars in the bathroom?no Do you have throw rugs in your house?yes  Cognitive Testing  Alert? Yes Normal Appearance?Yes  Oriented to person? Yes Place? Yes  Time? Yes  Recall of three objects? Yes  Can perform simple calculations? Yes  Displays appropriate judgment?Yes  Can read the correct time from a watch face?Yes   List the Names of Other Physician/Practitioners you currently use:  See referral list for the physicians patient is  currently seeing. None. Reminded about diabetic eye exam    Review of Systems:see EPIC   Objective:     General appearance: Appears stated age and mildly obese  Head: Normocephalic, without obvious abnormality, atraumatic  Eyes: conj clear, EOMi PEERLA  Ears: normal TM's and external ear canals both ears  Nose: Nares normal. Septum midline. Mucosa normal. No drainage or sinus tenderness.  Throat: lips, mucosa, and tongue normal; teeth and gums normal  Neck: no adenopathy, no carotid bruit, no JVD, supple, symmetrical, trachea midline and thyroid not enlarged, symmetric, no tenderness/mass/nodules  No CVA tenderness.  Lungs: clear to auscultation bilaterally  Breasts: normal appearance, no masses or tenderness,  Heart: regular rate and rhythm, S1, S2 normal, no murmur, click, rub or gallop  Abdomen: soft, non-tender; bowel sounds normal; no masses, no organomegaly  Musculoskeletal: ROM normal in all joints, no crepitus, no deformity, Normal muscle strengthen. Back  is symmetric, no curvature. Skin: Skin color, texture, turgor normal. No rashes or lesions  Lymph nodes: Cervical, supraclavicular, and axillary nodes normal.  Neurologic: CN 2 -  12 Normal, Normal symmetric reflexes. Normal coordination and gait  Psych: Alert & Oriented x 3, Mood appear stable.    Assessment:    Annual wellness medicare exam   Plan:    During the course of the visit the patient was educated and counseled about appropriate screening and preventive services including: Mammogram, bone density, annual influenza immunization, colonoscopy       Patient Instructions (the written plan) was given to the patient.  Medicare Attestation  I have personally reviewed:  The patient's medical and social history  Their use of alcohol, tobacco or illicit drugs  Their current medications and supplements  The patient's functional ability including ADLs,fall risks, home safety risks, cognitive, and hearing and  visual impairment  Diet and physical activities  Evidence for depression or mood disorders  The patient's weight, height, BMI, and visual acuity have been recorded in the chart. I have made referrals, counseling, and provided education to the patient based on review of the above and I have provided the patient with a written personalized care plan for preventive services.

## 2012-09-29 DIAGNOSIS — J309 Allergic rhinitis, unspecified: Secondary | ICD-10-CM | POA: Insufficient documentation

## 2012-09-29 DIAGNOSIS — F439 Reaction to severe stress, unspecified: Secondary | ICD-10-CM | POA: Insufficient documentation

## 2012-09-29 NOTE — Patient Instructions (Addendum)
Continue same medications and return in 6 months. Consider counseling to help deal with parents.

## 2012-10-17 ENCOUNTER — Telehealth: Payer: Self-pay | Admitting: Internal Medicine

## 2012-10-17 MED ORDER — CLORAZEPATE DIPOTASSIUM 3.75 MG PO TABS: 3.7500 mg | ORAL_TABLET | Freq: Two times a day (BID) | ORAL | Status: DC

## 2012-10-17 NOTE — Telephone Encounter (Signed)
Pt stated she needs new prescription for generic clorazdiopot tab 3.75mg .

## 2012-10-17 NOTE — Telephone Encounter (Signed)
This is generic Tranxene 3.75 mg. Please refill for 6 months

## 2012-12-05 ENCOUNTER — Other Ambulatory Visit: Payer: Medicare Other | Admitting: Internal Medicine

## 2012-12-05 DIAGNOSIS — E119 Type 2 diabetes mellitus without complications: Secondary | ICD-10-CM

## 2012-12-05 DIAGNOSIS — E785 Hyperlipidemia, unspecified: Secondary | ICD-10-CM

## 2012-12-05 DIAGNOSIS — Z79899 Other long term (current) drug therapy: Secondary | ICD-10-CM

## 2012-12-05 LAB — HEMOGLOBIN A1C
Hgb A1c MFr Bld: 6.5 % — ABNORMAL HIGH (ref <5.7)
Mean Plasma Glucose: 140 mg/dL — ABNORMAL HIGH (ref <117)

## 2012-12-05 LAB — HEPATIC FUNCTION PANEL
ALT: 11 U/L (ref 0–35)
Albumin: 4.5 g/dL (ref 3.5–5.2)
Bilirubin, Direct: 0.1 mg/dL (ref 0.0–0.3)
Total Protein: 6.6 g/dL (ref 6.0–8.3)

## 2012-12-05 LAB — LIPID PANEL
Cholesterol: 152 mg/dL (ref 0–200)
HDL: 49 mg/dL (ref >39)
Total CHOL/HDL Ratio: 3.1 Ratio
VLDL: 21 mg/dL (ref 0–40)

## 2012-12-06 ENCOUNTER — Encounter: Payer: Self-pay | Admitting: Internal Medicine

## 2012-12-06 ENCOUNTER — Ambulatory Visit (INDEPENDENT_AMBULATORY_CARE_PROVIDER_SITE_OTHER): Payer: Medicare Other | Admitting: Internal Medicine

## 2012-12-06 VITALS — BP 110/74 | HR 88 | Temp 98.3°F | Ht 66.0 in | Wt 189.0 lb

## 2012-12-06 DIAGNOSIS — E785 Hyperlipidemia, unspecified: Secondary | ICD-10-CM

## 2012-12-06 DIAGNOSIS — F341 Dysthymic disorder: Secondary | ICD-10-CM

## 2012-12-06 DIAGNOSIS — F32A Depression, unspecified: Secondary | ICD-10-CM

## 2012-12-06 DIAGNOSIS — E119 Type 2 diabetes mellitus without complications: Secondary | ICD-10-CM

## 2012-12-06 DIAGNOSIS — F419 Anxiety disorder, unspecified: Secondary | ICD-10-CM

## 2012-12-06 MED ORDER — GLUCOSE BLOOD VI STRP: ORAL_STRIP | Status: AC

## 2012-12-06 NOTE — Patient Instructions (Addendum)
Continue same medications and return in 6 months. Try to diet exercise and lose weight

## 2012-12-06 NOTE — Addendum Note (Signed)
Addended by: Judy Pimple on: 12/06/2012 04:47 PM   Modules accepted: Orders

## 2012-12-06 NOTE — Progress Notes (Signed)
  Subjective:    Patient ID: Diana George, female    DOB: 08-Sep-1946, 67 y.o.   MRN: [id number]  HPI  For 6 month recheck on Hyperlipidemia and AODM. Did not take Januvia for 6 weeks end of year because of doughnut hole. Feels OK. Less stress with elderly parents. Has gained 9 pounds since last visit. Eye exam scheduled for April. Hgb AIC is 6.5% Lipid panel and liver functions normal and BP is stable. No new complaints.    Review of Systems     Objective:   Physical Exam skin is warm and dry. Neck supple without thyromegaly or carotid bruits. Chest clear to auscultation. Cardiac exam regular rate and rhythm normal S1 and S2. Extremities without edema. Alert and oriented. Affect is normal. Diabetic foot exam without ulcers        Assessment & Plan:  Type 2 diabetes mellitus-A1c slightly increased from last check  Weight gain of 9 pounds since last visit  Hyperlipidemia-normal lipid panel on statin medication  Anxiety depression-stable  Plan: Return in 6 months and continue same medications. Encouraged diet, exercise, weight loss. Eye exam scheduled for April.

## 2013-01-27 ENCOUNTER — Other Ambulatory Visit: Payer: Self-pay

## 2013-01-27 MED ORDER — CLORAZEPATE DIPOTASSIUM 3.75 MG PO TABS: 3.7500 mg | ORAL_TABLET | Freq: Two times a day (BID) | ORAL | Status: DC

## 2013-01-27 MED ORDER — CLORAZEPATE DIPOTASSIUM 7.5 MG PO TABS: 7.5000 mg | ORAL_TABLET | Freq: Every day | ORAL | Status: DC

## 2013-01-29 ENCOUNTER — Other Ambulatory Visit: Payer: Self-pay

## 2013-01-29 MED ORDER — SITAGLIPTIN PHOSPHATE 100 MG PO TABS: 100.0000 mg | ORAL_TABLET | Freq: Every day | ORAL | Status: DC

## 2013-01-29 MED ORDER — METFORMIN HCL 500 MG PO TABS: 500.0000 mg | ORAL_TABLET | Freq: Two times a day (BID) | ORAL | Status: DC

## 2013-01-29 MED ORDER — SERTRALINE HCL 100 MG PO TABS: 100.0000 mg | ORAL_TABLET | Freq: Every day | ORAL | Status: DC

## 2013-05-12 ENCOUNTER — Other Ambulatory Visit: Payer: Self-pay

## 2013-05-12 MED ORDER — CLORAZEPATE DIPOTASSIUM 3.75 MG PO TABS: 3.7500 mg | ORAL_TABLET | Freq: Two times a day (BID) | ORAL | Status: DC

## 2013-06-02 ENCOUNTER — Other Ambulatory Visit: Payer: Medicare Other | Admitting: Internal Medicine

## 2013-06-02 ENCOUNTER — Telehealth: Payer: Self-pay | Admitting: Internal Medicine

## 2013-06-02 NOTE — Telephone Encounter (Signed)
Patient instructed in e-mail ([email or web address]) to call our office to confirm appointment.  Patient also advised that she was scheduled for fasting labs today and no showed for them.  I LMOM last Thursday, 7/30 r/t fasting lab appointment today.  Patient given the choice to have fasting labs at 9:00 on 8/5 or at the time of her f/u visit.  However, asked that patient call the office to confirm the appointment once she receives my e-mail.  Otherwise, we can reschedule patient appointment.  Will await patient response.

## 2013-06-03 ENCOUNTER — Ambulatory Visit: Payer: Medicare Other | Admitting: Internal Medicine

## 2013-06-04 ENCOUNTER — Other Ambulatory Visit: Payer: Self-pay

## 2013-08-04 ENCOUNTER — Ambulatory Visit: Payer: Medicare Other | Attending: Orthopaedic Surgery | Admitting: Physical Therapy

## 2013-08-04 DIAGNOSIS — IMO0001 Reserved for inherently not codable concepts without codable children: Secondary | ICD-10-CM | POA: Insufficient documentation

## 2013-08-04 DIAGNOSIS — M25569 Pain in unspecified knee: Secondary | ICD-10-CM | POA: Insufficient documentation

## 2013-08-04 DIAGNOSIS — R262 Difficulty in walking, not elsewhere classified: Secondary | ICD-10-CM | POA: Insufficient documentation

## 2013-08-18 ENCOUNTER — Ambulatory Visit: Payer: Medicare Other | Admitting: Physical Therapy

## 2013-08-25 ENCOUNTER — Ambulatory Visit: Payer: Medicare Other | Admitting: Physical Therapy

## 2013-08-27 ENCOUNTER — Ambulatory Visit (INDEPENDENT_AMBULATORY_CARE_PROVIDER_SITE_OTHER): Payer: Medicare Other | Admitting: Internal Medicine

## 2013-08-27 DIAGNOSIS — Z23 Encounter for immunization: Secondary | ICD-10-CM

## 2013-12-08 ENCOUNTER — Other Ambulatory Visit: Payer: Medicare Other | Admitting: Internal Medicine

## 2013-12-08 DIAGNOSIS — E119 Type 2 diabetes mellitus without complications: Secondary | ICD-10-CM

## 2013-12-08 DIAGNOSIS — E039 Hypothyroidism, unspecified: Secondary | ICD-10-CM

## 2013-12-08 DIAGNOSIS — Z1329 Encounter for screening for other suspected endocrine disorder: Secondary | ICD-10-CM

## 2013-12-08 DIAGNOSIS — Z79899 Other long term (current) drug therapy: Secondary | ICD-10-CM

## 2013-12-08 DIAGNOSIS — Z130 Encounter for screening for diseases of the blood and blood-forming organs and certain disorders involving the immune mechanism: Secondary | ICD-10-CM

## 2013-12-08 DIAGNOSIS — E785 Hyperlipidemia, unspecified: Secondary | ICD-10-CM

## 2013-12-08 DIAGNOSIS — Z13228 Encounter for screening for other metabolic disorders: Secondary | ICD-10-CM

## 2013-12-08 LAB — CBC WITH DIFFERENTIAL/PLATELET
BASOS ABS: 0 10*3/uL (ref 0.0–0.1)
Basophils Relative: 1 % (ref 0–1)
Eosinophils Absolute: 0.3 10*3/uL (ref 0.0–0.7)
Eosinophils Relative: 3 % (ref 0–5)
HCT: 41.2 % (ref 36.0–46.0)
Hemoglobin: 13.6 g/dL (ref 12.0–15.0)
LYMPHS ABS: 2.6 10*3/uL (ref 0.7–4.0)
LYMPHS PCT: 34 % (ref 12–46)
MCH: 30.4 pg (ref 26.0–34.0)
MCHC: 33 g/dL (ref 30.0–36.0)
MCV: 92 fL (ref 78.0–100.0)
Monocytes Absolute: 0.5 10*3/uL (ref 0.1–1.0)
Monocytes Relative: 7 % (ref 3–12)
NEUTROS ABS: 4.2 10*3/uL (ref 1.7–7.7)
NEUTROS PCT: 55 % (ref 43–77)
PLATELETS: 367 10*3/uL (ref 150–400)
RBC: 4.48 MIL/uL (ref 3.87–5.11)
RDW: 13.4 % (ref 11.5–15.5)
WBC: 7.6 10*3/uL (ref 4.0–10.5)

## 2013-12-08 LAB — LIPID PANEL
CHOL/HDL RATIO: 3.1 ratio
CHOLESTEROL: 138 mg/dL (ref 0–200)
HDL: 44 mg/dL (ref >39)
LDL Cholesterol: 63 mg/dL (ref 0–99)
Triglycerides: 155 mg/dL — ABNORMAL HIGH (ref <150)
VLDL: 31 mg/dL (ref 0–40)

## 2013-12-08 LAB — COMPREHENSIVE METABOLIC PANEL
ALT: 12 U/L (ref 0–35)
AST: 13 U/L (ref 0–37)
Albumin: 4.4 g/dL (ref 3.5–5.2)
Alkaline Phosphatase: 70 U/L (ref 39–117)
BUN: 14 mg/dL (ref 6–23)
CALCIUM: 9.2 mg/dL (ref 8.4–10.5)
CHLORIDE: 105 meq/L (ref 96–112)
CO2: 26 meq/L (ref 19–32)
CREATININE: 0.99 mg/dL (ref 0.50–1.10)
Glucose, Bld: 167 mg/dL — ABNORMAL HIGH (ref 70–99)
Potassium: 4.6 mEq/L (ref 3.5–5.3)
SODIUM: 139 meq/L (ref 135–145)
TOTAL PROTEIN: 6.3 g/dL (ref 6.0–8.3)
Total Bilirubin: 0.6 mg/dL (ref 0.2–1.2)

## 2013-12-08 LAB — HEMOGLOBIN A1C
HEMOGLOBIN A1C: 6.9 % — AB (ref <5.7)
Mean Plasma Glucose: 151 mg/dL — ABNORMAL HIGH (ref <117)

## 2013-12-09 ENCOUNTER — Encounter: Payer: Self-pay | Admitting: Internal Medicine

## 2013-12-09 ENCOUNTER — Ambulatory Visit (INDEPENDENT_AMBULATORY_CARE_PROVIDER_SITE_OTHER): Payer: Medicare Other | Admitting: Internal Medicine

## 2013-12-09 VITALS — BP 130/84 | HR 72 | Temp 99.1°F | Ht 65.75 in | Wt 195.0 lb

## 2013-12-09 DIAGNOSIS — F32A Depression, unspecified: Secondary | ICD-10-CM

## 2013-12-09 DIAGNOSIS — E119 Type 2 diabetes mellitus without complications: Secondary | ICD-10-CM

## 2013-12-09 DIAGNOSIS — F419 Anxiety disorder, unspecified: Secondary | ICD-10-CM

## 2013-12-09 DIAGNOSIS — F341 Dysthymic disorder: Secondary | ICD-10-CM

## 2013-12-09 DIAGNOSIS — R319 Hematuria, unspecified: Secondary | ICD-10-CM

## 2013-12-09 DIAGNOSIS — E785 Hyperlipidemia, unspecified: Secondary | ICD-10-CM

## 2013-12-09 DIAGNOSIS — F329 Major depressive disorder, single episode, unspecified: Secondary | ICD-10-CM

## 2013-12-09 DIAGNOSIS — Z0000 Encounter for general adult medical examination without abnormal findings: Secondary | ICD-10-CM

## 2013-12-09 DIAGNOSIS — F4321 Adjustment disorder with depressed mood: Secondary | ICD-10-CM

## 2013-12-09 LAB — POCT URINALYSIS DIPSTICK
Bilirubin, UA: NEGATIVE
Glucose, UA: NEGATIVE
KETONES UA: NEGATIVE
Leukocytes, UA: NEGATIVE
Nitrite, UA: NEGATIVE
PH UA: 6
PROTEIN UA: NEGATIVE
SPEC GRAV UA: 1.01
UROBILINOGEN UA: NEGATIVE

## 2013-12-09 LAB — VITAMIN D 25 HYDROXY (VIT D DEFICIENCY, FRACTURES): VIT D 25 HYDROXY: 51 ng/mL (ref 30–89)

## 2013-12-09 LAB — TSH: TSH: 2.475 u[IU]/mL (ref 0.350–4.500)

## 2013-12-09 NOTE — Progress Notes (Signed)
Subjective:    Patient ID: Diana George, female    DOB: [birth year], 68 y.o.   MRN: [id number]  HPI 68 year old White female for health maintenance and evaluation of medical issues.  In Apr 21, 2014father died of septicemia with history of MI, coronary artery disease and hematoma of the brain. He was 28 years old. Mother died in 21-Aug-2014with history of dementia and urinary tract infection at age 13. Patient is grieving their loss but trying to cope with things as best she can. Has history of depression and anxiety.  History of type 2 diabetes mellitus, hyperlipidemia, allergic rhinitis.  Past medical history: Bilateral tubal ligation 1980, stone basket extraction for kidney stone in mid 1970s, fractured left wrist November 2007 do to a fall in addition to a right anterior cruciate ligament tear and meniscal tear.  Cataract extraction left eye March 2011.  Normal Cardiolite study in 2002. 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 This study was done for syncope. She had mild mitral regurgitation. She was seen by Dr. 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 She had catheter ablation September 2005.  She had allergy evaluation 2002 with significant reactions to various tree pollens, house dust, dust mites. Mild reaction to molds. No food allergies. Spirometry at that time was normal.  She is allergic to amoxicillin causes a rash.  Social history: She is divorced. Resides alone. She is an only child. 2 adult children. Does not smoke or consume alcohol. Retired Counselling psychologist at [employer].  Family history as above  Reminded regarding annual eye exam. Pneumovax 2011. Gets annual influenza immunization. Tetanus immunization 2012.    Review of Systems  Constitutional: Negative.   HENT: Negative.   Eyes: Negative.   Respiratory: Negative.   Cardiovascular: Negative.   Gastrointestinal: Negative.   Endocrine:       Hyperlipidemia    Genitourinary: Negative.   Allergic/Immunologic: Negative.   Neurological: Negative.   Hematological: Negative.        Objective:   Physical Exam  Vitals reviewed. Constitutional: She is oriented to person, place, and time. She appears well-developed and well-nourished. No distress.  HENT:  Head: Normocephalic and atraumatic.  Right Ear: External ear normal.  Left Ear: External ear normal.  Mouth/Throat: Oropharynx is clear and moist. No oropharyngeal exudate.  Eyes: Conjunctivae and EOM are normal. Pupils are equal, round, and reactive to light. Right eye exhibits no discharge. Left eye exhibits no discharge. No scleral icterus.  Neck: Neck supple. No JVD present. No thyromegaly present.  Cardiovascular: Normal rate, regular rhythm, normal heart sounds and intact distal pulses.   No murmur heard. Pulmonary/Chest: Effort normal and breath sounds normal. No respiratory distress. She has no wheezes. She has no rales. She exhibits no tenderness.  Breasts no masses  Abdominal: Soft. Bowel sounds are normal. She exhibits no distension and no mass. There is no tenderness. There is no rebound and no guarding.  Genitourinary:  Deferred to GYN  Musculoskeletal: Normal range of motion. She exhibits no edema.  Lymphadenopathy:    She has no cervical adenopathy.  Neurological: She is alert and oriented to person, place, and time. She has normal reflexes. No cranial nerve deficit. Coordination normal.  Skin: Skin is warm and dry. She is not diaphoretic.  Psychiatric: She has a normal mood and affect. Her behavior is normal. Judgment and thought content normal.  Appropriate grief reaction  Assessment & Plan:  Controlled type 2 diabetes Anxiety and depression Grief reaction Hyperlipidemia  Plan: Continue same medications. Encourage patient to take care of her own health mentally and physically. Return in 6 months.    Subjective:   Patient presents for Medicare  Annual/Subsequent preventive examination.   Review Past Medical/Family/Social: see above   Risk Factors  Current exercise habits: walks Dietary issues discussed: low fat low carb  Cardiac risk factors: DM hyperlipidemia  Depression Screen  (Note: if answer to either of the following is "Yes", a more complete depression screening is indicated)   Over the past two weeks, have you felt down, depressed or hopeless? No  Over the past two weeks, have you felt little interest or pleasure in doing things? No Have you lost interest or pleasure in daily life? No Do you often feel hopeless? No Do you cry easily over simple problems? No   Activities of Daily Living  In your present state of health, do you have any difficulty performing the following activities?:   Driving? No  Managing money? No  Feeding yourself? No  Getting from bed to chair? No  Climbing a flight of stairs? No  Preparing food and eating?: No  Bathing or showering? No  Getting dressed: No  Getting to the toilet? No  Using the toilet:No  Moving around from place to place: No  In the past year have you fallen or had a near fall?:No  Are you sexually active? No  Do you have more than one partner? No   Hearing Difficulties: No  Do you often ask people to speak up or repeat themselves? No  Do you experience ringing or noises in your ears? No  Do you have difficulty understanding soft or whispered voices? No  Do you feel that you have a problem with memory? No Do you often misplace items? No    Home Safety:  Do you have a smoke alarm at your residence? Yes Do you have grab bars in the bathroom?no Do you have throw rugs in your house? yes   Cognitive Testing  Alert? Yes Normal Appearance?Yes  Oriented to person? Yes Place? Yes  Time? Yes  Recall of three objects? Yes  Can perform simple calculations? Yes  Displays appropriate judgment?Yes  Can read the correct time from a watch face?Yes   List the Names of  Other Physician/Practitioners you currently use:  See referral list for the physicians patient is currently seeing.     Review of Systems: see above   Objective:     General appearance: Appears stated age and mildly obese  Head: Normocephalic, without obvious abnormality, atraumatic  Eyes: conj clear, EOMi PEERLA  Ears: normal TM's and external ear canals both ears  Nose: Nares normal. Septum midline. Mucosa normal. No drainage or sinus tenderness.  Throat: lips, mucosa, and tongue normal; teeth and gums normal  Neck: no adenopathy, no carotid bruit, no JVD, supple, symmetrical, trachea midline and thyroid not enlarged, symmetric, no tenderness/mass/nodules  No CVA tenderness.  Lungs: clear to auscultation bilaterally  Breasts: normal appearance, no masses or tenderness Heart: regular rate and rhythm, S1, S2 normal, no murmur, click, rub or gallop  Abdomen: soft, non-tender; bowel sounds normal; no masses, no organomegaly  Musculoskeletal: ROM normal in all joints, no crepitus, no deformity, Normal muscle strengthen. Back  is symmetric, no curvature. Skin: Skin color, texture, turgor normal. No rashes or lesions  Lymph nodes: Cervical, supraclavicular, and axillary nodes normal.  Neurologic: CN 2 -  12 Normal, Normal symmetric reflexes. Normal coordination and gait  Psych: Alert & Oriented x 3, Mood appear stable.    Assessment:    Annual wellness medicare exam   Plan:    During the course of the visit the patient was educated and counseled about appropriate screening and preventive services including:   Annual diabetic eye exam GYN- Dr. Vickey SagesAtkins next month will have mammogram there     Patient Instructions (the written plan) was given to the patient.  Medicare Attestation  I have personally reviewed:  The patient's medical and social history  Their use of alcohol, tobacco or illicit drugs  Their current medications and supplements  The patient's functional ability  including ADLs,fall risks, home safety risks, cognitive, and hearing and visual impairment  Diet and physical activities  Evidence for depression or mood disorders  The patient's weight, height, BMI, and visual acuity have been recorded in the chart. I have made referrals, counseling, and provided education to the patient based on review of the above and I have provided the patient with a written personalized care plan for preventive services.

## 2013-12-10 LAB — URINALYSIS, ROUTINE W REFLEX MICROSCOPIC
Bilirubin Urine: NEGATIVE
GLUCOSE, UA: NEGATIVE mg/dL
HGB URINE DIPSTICK: NEGATIVE
Leukocytes, UA: NEGATIVE
Nitrite: NEGATIVE
PH: 6.5 (ref 5.0–8.0)
PROTEIN: NEGATIVE mg/dL
Specific Gravity, Urine: 1.027 (ref 1.005–1.030)
Urobilinogen, UA: 0.2 mg/dL (ref 0.0–1.0)

## 2013-12-10 LAB — HM MAMMOGRAPHY

## 2013-12-26 ENCOUNTER — Other Ambulatory Visit: Payer: Self-pay

## 2013-12-26 ENCOUNTER — Telehealth: Payer: Self-pay | Admitting: Internal Medicine

## 2013-12-26 MED ORDER — SITAGLIPTIN PHOSPHATE 100 MG PO TABS: 100.0000 mg | ORAL_TABLET | Freq: Every day | ORAL | Status: DC

## 2013-12-26 MED ORDER — CLORAZEPATE DIPOTASSIUM 3.75 MG PO TABS: 3.7500 mg | ORAL_TABLET | Freq: Two times a day (BID) | ORAL | Status: DC

## 2013-12-26 NOTE — Telephone Encounter (Signed)
Please refill Januvia for one year and Tranxene for 6 months

## 2014-01-01 ENCOUNTER — Other Ambulatory Visit: Payer: Self-pay

## 2014-02-02 LAB — HM DIABETES EYE EXAM

## 2014-02-11 ENCOUNTER — Other Ambulatory Visit: Payer: Self-pay

## 2014-02-11 MED ORDER — SIMVASTATIN 20 MG PO TABS: 20.0000 mg | ORAL_TABLET | Freq: Every day | ORAL | Status: DC

## 2014-02-11 MED ORDER — BUPROPION HCL ER (XL) 150 MG PO TB24: 150.0000 mg | ORAL_TABLET | Freq: Every day | ORAL | Status: DC

## 2014-02-11 MED ORDER — SERTRALINE HCL 100 MG PO TABS: 100.0000 mg | ORAL_TABLET | Freq: Every day | ORAL | Status: DC

## 2014-02-17 ENCOUNTER — Other Ambulatory Visit: Payer: Self-pay

## 2014-02-17 MED ORDER — SERTRALINE HCL 100 MG PO TABS: 100.0000 mg | ORAL_TABLET | Freq: Every day | ORAL | Status: DC

## 2014-03-17 ENCOUNTER — Telehealth: Payer: Self-pay

## 2014-03-17 DIAGNOSIS — G4730 Sleep apnea, unspecified: Secondary | ICD-10-CM

## 2014-03-17 NOTE — Telephone Encounter (Signed)
Patient scheduled for an appointment with Dr. Shelle Ironlance on 04/13/2014 at 1:45. Informed of this.

## 2014-03-17 NOTE — Telephone Encounter (Signed)
Patient's dentist did some type of home sleep apnea test, and told her she may indeed have sleep apnea. Per Dr. Lenord FellersBaxley will refer to Dr. Shelle Ironlance for consult and formal testing.

## 2014-03-26 ENCOUNTER — Encounter: Payer: Self-pay | Admitting: Internal Medicine

## 2014-03-26 ENCOUNTER — Ambulatory Visit (INDEPENDENT_AMBULATORY_CARE_PROVIDER_SITE_OTHER): Payer: Medicare Other | Admitting: Internal Medicine

## 2014-03-26 VITALS — BP 116/70 | HR 80 | Temp 99.6°F | Wt 195.0 lb

## 2014-03-26 DIAGNOSIS — H6693 Otitis media, unspecified, bilateral: Secondary | ICD-10-CM

## 2014-03-26 DIAGNOSIS — J019 Acute sinusitis, unspecified: Secondary | ICD-10-CM

## 2014-03-26 DIAGNOSIS — H6690 Otitis media, unspecified, unspecified ear: Secondary | ICD-10-CM

## 2014-03-26 MED ORDER — HYDROCODONE-HOMATROPINE 5-1.5 MG/5ML PO SYRP: 5.0000 mL | ORAL_SOLUTION | Freq: Three times a day (TID) | ORAL | Status: DC | PRN

## 2014-03-26 MED ORDER — METHYLPREDNISOLONE ACETATE 80 MG/ML IJ SUSP
80.0000 mg | Freq: Once | INTRAMUSCULAR | Status: AC
  Administered 2014-03-26: 80 mg via INTRAMUSCULAR

## 2014-03-26 MED ORDER — LEVOFLOXACIN 500 MG PO TABS: 500.0000 mg | ORAL_TABLET | Freq: Every day | ORAL | Status: DC

## 2014-03-26 NOTE — Progress Notes (Signed)
   Subjective:    Patient ID: Diana George, female    DOB: 1946-09-07, 68 y.o.   MRN: [id number]  HPI Has come down with acute upper respiratory infection, maxillary sinus pressure, left ear pain onset several days ago. Is going to [place] in one week with family. No fever or shaking chills. Some cough. Has upcoming appointment with pulmonologist regarding possible sleep apnea. Patient says family has noticed she snores loudly.    Review of Systems     Objective:   Physical Exam Both TMs are full and red; pharynx slightly injected without exudate; neck is supple without adenopathy; chest clear to auscultation; she sounds nasally congested when she states        Assessment & Plan:  Acute sinusitis  Bilateral otitis media  Possible sleep apnea  Plan: Levaquin 500 milligrams daily for 7 days. She has one refill. She should take refill with her on trip to [place]. Hycodan 8 ounces 1 teaspoon by mouth every 8 hours when necessary cough. Depo-Medrol 80 mg IM given today for congestion. Rest and drink plenty of fluids.

## 2014-03-26 NOTE — Patient Instructions (Signed)
Depomedrol IM given. Take Levaquin 500 mg daily x 7 days. If not better in one week, have Rx refilled. Take Hycodan one tsp every 12 hours as needed for cough.

## 2014-04-13 ENCOUNTER — Encounter: Payer: Self-pay | Admitting: Pulmonary Disease

## 2014-04-13 ENCOUNTER — Other Ambulatory Visit: Payer: Self-pay

## 2014-04-13 ENCOUNTER — Ambulatory Visit (INDEPENDENT_AMBULATORY_CARE_PROVIDER_SITE_OTHER): Payer: Medicare Other | Admitting: Pulmonary Disease

## 2014-04-13 VITALS — BP 110/78 | HR 78 | Temp 98.4°F | Ht 66.5 in | Wt 195.4 lb

## 2014-04-13 DIAGNOSIS — G4733 Obstructive sleep apnea (adult) (pediatric): Secondary | ICD-10-CM

## 2014-04-13 MED ORDER — METFORMIN HCL 500 MG PO TABS: 500.0000 mg | ORAL_TABLET | Freq: Two times a day (BID) | ORAL | Status: DC

## 2014-04-13 NOTE — Patient Instructions (Signed)
Will start on cpap at a moderate pressure level.  Please call if having issues with tolerance. Work on weight reduction. followup with me again in 8 weeks.  

## 2014-04-13 NOTE — Assessment & Plan Note (Signed)
The patient has moderate obstructive sleep apnea by her recent home sleep test, although she is not significantly symptomatic during the day. I have outlined a conservative path with a trial of weight loss over the next 6 months versus a more aggressive path with either a dental appliance or CPAP. Her degree of sleep apnea would best be treated by CPAP rather than a dental appliance. After a long discussion with the patient, she would like to try CPAP while working on weight loss. I will set the patient up on cpap at a moderate pressure level to allow for desensitization, and will troubleshoot the device over the next 4-6weeks if needed.  The pt is to call me if having issues with tolerance.  Will then optimize the pressure once patient is able to wear cpap on a consistent basis.

## 2014-04-13 NOTE — Progress Notes (Signed)
Subjective:    Patient ID: Diana George, female    DOB: 19-Jul-1946, 68 y.o.   MRN: [id number]  HPI The patient is a 68 year old female who I've been asked to see for management of obstructive sleep apnea. She underwent a sleep study in 2005 that showed mild OSA, with an AHI of 14 events per hour. The decision was made at that time to work on weight loss, and she feels that she has had increasing symptoms since that time, although her weight is fairly neutral from her initial study. She has recently had a home sleep test which showed an AHI of 34 of events per hour one night, and only 4 of events per hour another. However, she slept very poorly on the night with her low AHI. The patient has been noted to have loud snoring, but no one lives with her to comment on an abnormal breathing pattern during sleep. She does not awaken frequently at night, but does not feel rested in the mornings upon arising. She does have some sleepiness during the day watching television, but has no issues with paperwork or while on the computer. She denies any sleepiness in the evening with inactivity, and has no sleepiness with driving. The patient's Epworth score today is normal at 5   Sleep Questionnaire What time do you typically go to bed?( Between what hours) 11-12 11-12 at 1348 on 04/13/14 by Darrell JewelJennifer R Castillo, CMA How long does it take you to fall asleep? 1 hour 1 hour at 1348 on 04/13/14 by Darrell JewelJennifer R Castillo, CMA How many times during the night do you wake up? 1 1 at 1348 on 04/13/14 by Darrell JewelJennifer R Castillo, CMA What time do you get out of bed to start your day? 0830 0830 at 1348 on 04/13/14 by Darrell JewelJennifer R Castillo, CMA Do you drive or operate heavy machinery in your occupation? No No at 1348 on 04/13/14 by Darrell JewelJennifer R Castillo, CMA How much has your weight changed (up or down) over the past two years? (In pounds) 15 lb (6.804 kg) 15 lb (6.804 kg) at 1348 on 04/13/14 by Darrell JewelJennifer R Castillo, CMA Have  you ever had a sleep study before? Yes Yes at 1348 on 04/13/14 by Darrell JewelJennifer R Castillo, CMA If yes, location of study? [facility] [place] at 1348 on 04/13/14 by Darrell JewelJennifer R Castillo, CMA If yes, date of study? 2005 2005 at 1348 on 04/13/14 by Darrell JewelJennifer R Castillo, CMA Do you currently use CPAP? No No at 1348 on 04/13/14 by Darrell JewelJennifer R Castillo, CMA Do you wear oxygen at any time? No No at 1348 on 04/13/14 by Darrell JewelJennifer R Castillo, CMA   Review of Systems  Constitutional: Negative for fever and unexpected weight change.  HENT: Negative for congestion, dental problem, ear pain, nosebleeds, postnasal drip, rhinorrhea, sinus pressure, sneezing, sore throat and trouble swallowing.   Eyes: Negative for redness and itching.  Respiratory: Positive for shortness of breath. Negative for cough, chest tightness and wheezing.   Cardiovascular: Negative for palpitations and leg swelling.  Gastrointestinal: Negative for nausea and vomiting.  Genitourinary: Negative for dysuria.  Musculoskeletal: Negative for joint swelling.  Skin: Negative for rash.  Neurological: Negative for headaches.  Hematological: Does not bruise/bleed easily.  Psychiatric/Behavioral: Positive for dysphoric mood. The patient is not nervous/anxious.        Objective:   Physical Exam Constitutional:  Overweight female, no acute distress  HENT:  Nares patent without discharge, mild septal deviation to the left  Oropharynx  without exudate, palate and uvula elongated.   Eyes:  Perrla, eomi, no scleral icterus  Neck:  No JVD, no TMG  Cardiovascular:  Normal rate, regular rhythm, no rubs or gallops.  No murmurs        Intact distal pulses  Pulmonary :  Normal breath sounds, no stridor or respiratory distress   No rales, rhonchi, or wheezing  Abdominal:  Soft, nondistended, bowel sounds present.  No tenderness noted.   Musculoskeletal:  No lower extremity edema noted.  Lymph Nodes:  No cervical lymphadenopathy  noted  Skin:  No cyanosis noted  Neurologic:  Alert, appropriate, moves all 4 extremities without obvious deficit.         Assessment & Plan:

## 2014-05-15 ENCOUNTER — Other Ambulatory Visit: Payer: Self-pay

## 2014-05-15 MED ORDER — CLORAZEPATE DIPOTASSIUM 3.75 MG PO TABS: 3.7500 mg | ORAL_TABLET | Freq: Two times a day (BID) | ORAL | Status: DC

## 2014-05-24 NOTE — Patient Instructions (Signed)
Continue same medications and return in 6 months. Take care of yourself mentally and physically.

## 2014-06-08 ENCOUNTER — Encounter: Payer: Self-pay | Admitting: Pulmonary Disease

## 2014-06-08 ENCOUNTER — Ambulatory Visit (INDEPENDENT_AMBULATORY_CARE_PROVIDER_SITE_OTHER): Payer: Medicare Other | Admitting: Pulmonary Disease

## 2014-06-08 ENCOUNTER — Other Ambulatory Visit: Payer: Self-pay | Admitting: Pulmonary Disease

## 2014-06-08 VITALS — BP 108/70 | HR 88 | Temp 98.1°F | Ht 66.5 in | Wt 189.0 lb

## 2014-06-08 DIAGNOSIS — G4733 Obstructive sleep apnea (adult) (pediatric): Secondary | ICD-10-CM

## 2014-06-08 NOTE — Assessment & Plan Note (Signed)
The patient has done very well with CPAP since the last visit, and has seen significant improvement in her symptoms. I have asked her to continue with her device, keep up with mask changes and supplies, and to keep working on weight loss. I will see her back in 6 months if doing well.

## 2014-06-08 NOTE — Patient Instructions (Signed)
Continue with cpap, and will have your upper level pressure increased mildly. Keep working on weight loss, you are doing great. followup with me again in 6mos.

## 2014-06-08 NOTE — Progress Notes (Signed)
   Subjective:    Patient ID: Diana George, female    DOB: 06/10/1946, 68 y.o.   MRN: [id number]  HPI Patient comes in today for followup of her obstructive sleep apnea. She is wearing CPAP compliantly by her download, and has excellent control of her obstructive sleep apnea. She is having no significant mask leaks, and no issues with her pressure. Her AHI on average is well controlled, but she does have a few days which exceed the 5 per hour level.  We will need to increase her upper level pressure slightly. She has seen significant improvement in her sleep and daytime alertness, and is very satisfied with her progress. She has lost weight since last visit.   Review of Systems  Constitutional: Negative for fever and unexpected weight change.  HENT: Negative for congestion, dental problem, ear pain, nosebleeds, postnasal drip, rhinorrhea, sinus pressure, sneezing, sore throat and trouble swallowing.   Eyes: Negative for redness and itching.  Respiratory: Negative for cough, chest tightness, shortness of breath and wheezing.   Cardiovascular: Negative for palpitations and leg swelling.  Gastrointestinal: Negative for nausea and vomiting.  Genitourinary: Negative for dysuria.  Musculoskeletal: Negative for joint swelling.  Skin: Negative for rash.  Neurological: Negative for headaches.  Hematological: Does not bruise/bleed easily.  Psychiatric/Behavioral: Negative for dysphoric mood. The patient is not nervous/anxious.        Objective:   Physical Exam Overweight female in no acute distress Nose without purulence or discharge noted Neck without lymphadenopathy or thyromegaly No skin breakdown or pressure necrosis from the CPAP mask Lower extremities without significant edema, no cyanosis Alert and oriented, moves all 4 extremities.       Assessment & Plan:

## 2014-06-09 ENCOUNTER — Other Ambulatory Visit: Payer: Medicare Other | Admitting: Internal Medicine

## 2014-06-09 DIAGNOSIS — E785 Hyperlipidemia, unspecified: Secondary | ICD-10-CM

## 2014-06-09 DIAGNOSIS — E119 Type 2 diabetes mellitus without complications: Secondary | ICD-10-CM

## 2014-06-09 DIAGNOSIS — Z79899 Other long term (current) drug therapy: Secondary | ICD-10-CM

## 2014-06-09 LAB — HEPATIC FUNCTION PANEL
ALT: 10 U/L (ref 0–35)
AST: 13 U/L (ref 0–37)
Albumin: 4.3 g/dL (ref 3.5–5.2)
Alkaline Phosphatase: 54 U/L (ref 39–117)
BILIRUBIN DIRECT: 0.1 mg/dL (ref 0.0–0.3)
BILIRUBIN INDIRECT: 0.4 mg/dL (ref 0.2–1.2)
BILIRUBIN TOTAL: 0.5 mg/dL (ref 0.2–1.2)
Total Protein: 6.4 g/dL (ref 6.0–8.3)

## 2014-06-09 LAB — LIPID PANEL
CHOL/HDL RATIO: 2.7 ratio
Cholesterol: 155 mg/dL (ref 0–200)
HDL: 58 mg/dL (ref >39)
LDL Cholesterol: 79 mg/dL (ref 0–99)
Triglycerides: 88 mg/dL (ref <150)
VLDL: 18 mg/dL (ref 0–40)

## 2014-06-09 LAB — HEMOGLOBIN A1C
HEMOGLOBIN A1C: 6 % — AB (ref <5.7)
MEAN PLASMA GLUCOSE: 126 mg/dL — AB (ref <117)

## 2014-06-11 ENCOUNTER — Encounter: Payer: Self-pay | Admitting: Internal Medicine

## 2014-06-11 ENCOUNTER — Ambulatory Visit (INDEPENDENT_AMBULATORY_CARE_PROVIDER_SITE_OTHER): Payer: Medicare Other | Admitting: Internal Medicine

## 2014-06-11 VITALS — BP 106/72 | HR 80 | Ht 65.75 in | Wt 190.5 lb

## 2014-06-11 DIAGNOSIS — G4733 Obstructive sleep apnea (adult) (pediatric): Secondary | ICD-10-CM

## 2014-06-11 DIAGNOSIS — E119 Type 2 diabetes mellitus without complications: Secondary | ICD-10-CM

## 2014-06-11 DIAGNOSIS — H6122 Impacted cerumen, left ear: Secondary | ICD-10-CM

## 2014-06-11 DIAGNOSIS — H6120 Impacted cerumen, unspecified ear: Secondary | ICD-10-CM

## 2014-06-11 DIAGNOSIS — E785 Hyperlipidemia, unspecified: Secondary | ICD-10-CM

## 2014-06-11 MED ORDER — GLUCOSE BLOOD VI STRP: ORAL_STRIP | Status: DC

## 2014-06-11 NOTE — Patient Instructions (Signed)
Continue diet exercise and weight loss efforts. Continue CPAP. Return in 6 months.

## 2014-06-11 NOTE — Addendum Note (Signed)
Addended by: Judy PimpleEILAND, Tonyetta Berko M on: 06/11/2014 11:34 AM   Modules accepted: Orders

## 2014-06-11 NOTE — Progress Notes (Signed)
   Subjective:    Patient ID: Ihor GullySharon E Ketcherside, female    DOB: Nov 20, 1945, 68 y.o.   MRN: [id number]  HPI  For 6 month recheck on hyperlipidemia and AODM.  Hgb  AIC has come  down from 6.9 to 6.0% with diet alone.  Right knee painful and has been told she needs a knee replacement. Feels better with CPAP. Diagnosed with sleep apnea.   Had eye exam with Dr. Dagoberto LigasStoneburner within the past year. Complaining of decreased hearing left ear.    Review of Systems     Objective:   Physical Exam impacted cerumen left external ear canal which was removed with curette. Chest clear to auscultation. Cardiac exam: regular rate and rhythm. Extremities without edema. Diabetic foot exam performed.        Assessment & Plan:  Well controlled diabetes mellitus with significant improvement in hemoglobin A 1C  Impacted cerumen left ear canal removed with curette  Hyperlipidemia-lipid panel liver functions are within normal limits on statin medication  Obstructive sleep apnea-feels better on CPAP  Plan: Return for physical exam in 6 months  25 minutes spent with patient

## 2014-08-18 ENCOUNTER — Other Ambulatory Visit: Payer: Self-pay

## 2014-08-18 MED ORDER — SIMVASTATIN 20 MG PO TABS: 20.0000 mg | ORAL_TABLET | Freq: Every day | ORAL | Status: DC

## 2014-08-19 ENCOUNTER — Ambulatory Visit (INDEPENDENT_AMBULATORY_CARE_PROVIDER_SITE_OTHER): Payer: Medicare Other | Admitting: Internal Medicine

## 2014-08-19 DIAGNOSIS — Z23 Encounter for immunization: Secondary | ICD-10-CM

## 2014-09-08 ENCOUNTER — Encounter: Payer: Self-pay | Admitting: Internal Medicine

## 2014-10-28 ENCOUNTER — Other Ambulatory Visit: Payer: Self-pay | Admitting: Internal Medicine

## 2014-11-12 ENCOUNTER — Ambulatory Visit
Admission: RE | Admit: 2014-11-12 | Discharge: 2014-11-12 | Disposition: A | Discharge status: Home / Self Care | Payer: Medicare Other | Source: Ambulatory Visit | Attending: Internal Medicine | Admitting: Internal Medicine

## 2014-11-12 ENCOUNTER — Encounter: Payer: Self-pay | Admitting: Internal Medicine

## 2014-11-12 ENCOUNTER — Ambulatory Visit (INDEPENDENT_AMBULATORY_CARE_PROVIDER_SITE_OTHER): Payer: Medicare Other | Admitting: Internal Medicine

## 2014-11-12 VITALS — BP 102/60 | HR 86 | Temp 98.0°F | Wt 187.0 lb

## 2014-11-12 DIAGNOSIS — Z0000 Encounter for general adult medical examination without abnormal findings: Secondary | ICD-10-CM

## 2014-11-12 DIAGNOSIS — R109 Unspecified abdominal pain: Secondary | ICD-10-CM

## 2014-11-12 DIAGNOSIS — R319 Hematuria, unspecified: Secondary | ICD-10-CM

## 2014-11-12 DIAGNOSIS — Z130 Encounter for screening for diseases of the blood and blood-forming organs and certain disorders involving the immune mechanism: Secondary | ICD-10-CM

## 2014-11-12 LAB — POCT URINALYSIS DIPSTICK
GLUCOSE UA: NEGATIVE
Nitrite, UA: NEGATIVE
Protein, UA: 30
Spec Grav, UA: >=1.03
UROBILINOGEN UA: NEGATIVE
pH, UA: 6.5

## 2014-11-12 LAB — COMPREHENSIVE METABOLIC PANEL
ALT: 9 U/L (ref 0–35)
AST: 13 U/L (ref 0–37)
Albumin: 4.1 g/dL (ref 3.5–5.2)
Alkaline Phosphatase: 58 U/L (ref 39–117)
BUN: 15 mg/dL (ref 6–23)
CO2: 25 mEq/L (ref 19–32)
Calcium: 9.2 mg/dL (ref 8.4–10.5)
Chloride: 106 mEq/L (ref 96–112)
Creat: 0.99 mg/dL (ref 0.50–1.10)
Glucose, Bld: 103 mg/dL — ABNORMAL HIGH (ref 70–99)
Potassium: 4.6 mEq/L (ref 3.5–5.3)
Sodium: 139 mEq/L (ref 135–145)
Total Bilirubin: 0.8 mg/dL (ref 0.2–1.2)
Total Protein: 6.4 g/dL (ref 6.0–8.3)

## 2014-11-12 LAB — CBC WITH DIFFERENTIAL/PLATELET
Basophils Absolute: 0.1 10*3/uL (ref 0.0–0.1)
Basophils Relative: 1 % (ref 0–1)
Eosinophils Absolute: 0.2 10*3/uL (ref 0.0–0.7)
Eosinophils Relative: 3 % (ref 0–5)
HCT: 39.7 % (ref 36.0–46.0)
Hemoglobin: 13.1 g/dL (ref 12.0–15.0)
Lymphocytes Relative: 30 % (ref 12–46)
Lymphs Abs: 2.1 10*3/uL (ref 0.7–4.0)
MCH: 30.7 pg (ref 26.0–34.0)
MCHC: 33 g/dL (ref 30.0–36.0)
MCV: 93 fL (ref 78.0–100.0)
MPV: 10.4 fL (ref 8.6–12.4)
Monocytes Absolute: 0.6 10*3/uL (ref 0.1–1.0)
Monocytes Relative: 8 % (ref 3–12)
Neutro Abs: 4.1 10*3/uL (ref 1.7–7.7)
Neutrophils Relative %: 58 % (ref 43–77)
Platelets: 359 10*3/uL (ref 150–400)
RBC: 4.27 MIL/uL (ref 3.87–5.11)
RDW: 13.6 % (ref 11.5–15.5)
WBC: 7.1 10*3/uL (ref 4.0–10.5)

## 2014-11-12 NOTE — Addendum Note (Signed)
Addended by: Thomasena EdisWILLIAMS, Eliasar Hlavaty N on: 11/12/2014 11:19 AM   Modules accepted: Orders

## 2014-11-12 NOTE — Patient Instructions (Signed)
Have KUB flat and upright abdominal film. Didn't have CT of abdomen and pelvis. Schedule GI consult. CBC with differential and C met. Urinalysis done.

## 2014-11-12 NOTE — Addendum Note (Signed)
Addended by: Thomasena EdisWILLIAMS, Ayvah Caroll N on: 11/12/2014 11:56 AM   Modules accepted: Orders

## 2014-11-12 NOTE — Progress Notes (Signed)
   Subjective:    Patient ID: Diana George, female    DOB: May 31, 1946, 69 y.o.   MRN: [id number]  HPI  Noticed glob of coppery thick material passed twice  through urine on December 21. Then episode again on December 23. No urinary symptoms. None seen.  Around same time, just after Christmas developed obstipation and decreased caliber of stool.  No blood in  BM. No pain No fever. No recent colonoscopy. No melena. Says she ate some vegetables and stool had a whitish appearance. Stool situation has persisted. No dysuria. No pain with defecation.  Past medical history includes diabetes, hyperlipidemia, sleep apnea, anxiety depression.    Review of Systems     Objective:   Physical Exam Skin warm and dry. Nodes none. Bowel sounds are increased. Abdomen is soft nondistended no hepatosplenomegaly masses or tenderness. Small amount of tan-colored stool in rectal vault which is guaiac negative.  Urinalysis is abnormal. Will be sent for microscopic analysis and culture       Assessment & Plan:  ? Hematuria: Not sure if patient passed just a clot of blood or perhaps a kidney stone. We'll have CT to evaluate kidneys. Urine culture is pending as his microscopic urinalysis  Obstipation and decreased caliber of stool: No recent colonoscopy. Needs GI consult. Stool today is guaiac negative. We'll also have KUB flat and upright today and CT of abdomen and pelvis is pending. Will have CBC with DEF and C met drawn today.  Have spent 25 minutes seeing the patient, evaluating medical problems, medical decision-making, taking history and ordering tests. Also spent time counseling her regarding possibilities for each of these conditions.

## 2014-11-13 ENCOUNTER — Telehealth: Payer: Self-pay | Admitting: *Deleted

## 2014-11-13 LAB — URINALYSIS, MICROSCOPIC ONLY: Casts: NONE SEEN

## 2014-11-13 NOTE — Telephone Encounter (Signed)
Pt [facility]

## 2014-11-13 NOTE — Telephone Encounter (Signed)
Reviewed xray and lab results with patient also scheduled CT [facility] for Tues Jan 19th at 10:[facility] [id number]

## 2014-11-14 LAB — URINE CULTURE
Colony Count: NO GROWTH
Organism ID, Bacteria: NO GROWTH

## 2014-11-17 ENCOUNTER — Telehealth: Payer: Self-pay | Admitting: *Deleted

## 2014-11-17 ENCOUNTER — Ambulatory Visit
Admission: RE | Admit: 2014-11-17 | Discharge: 2014-11-17 | Disposition: A | Discharge status: Home / Self Care | Payer: Medicare Other | Source: Ambulatory Visit | Attending: Internal Medicine | Admitting: Internal Medicine

## 2014-11-17 DIAGNOSIS — N200 Calculus of kidney: Secondary | ICD-10-CM

## 2014-11-17 DIAGNOSIS — R109 Unspecified abdominal pain: Secondary | ICD-10-CM

## 2014-11-17 MED ORDER — IOHEXOL 300 MG/ML  SOLN
100.0000 mL | Freq: Once | INTRAMUSCULAR | Status: AC | PRN
  Administered 2014-11-17: 100 mL via INTRAVENOUS

## 2014-11-17 NOTE — Telephone Encounter (Signed)
Reviewed CT Scan results with patient . Referral placed for urology consult.

## 2014-12-09 ENCOUNTER — Encounter: Payer: Self-pay | Admitting: Pulmonary Disease

## 2014-12-09 ENCOUNTER — Ambulatory Visit (INDEPENDENT_AMBULATORY_CARE_PROVIDER_SITE_OTHER): Payer: Medicare Other | Admitting: Pulmonary Disease

## 2014-12-09 ENCOUNTER — Other Ambulatory Visit: Payer: Self-pay | Admitting: Urology

## 2014-12-09 VITALS — BP 122/70 | HR 74 | Temp 97.6°F | Ht 66.5 in | Wt 187.6 lb

## 2014-12-09 DIAGNOSIS — G4733 Obstructive sleep apnea (adult) (pediatric): Secondary | ICD-10-CM

## 2014-12-09 NOTE — Progress Notes (Signed)
   Subjective:    Patient ID: Ihor GullySharon E Bram, female    DOB: 1946-10-07, 69 y.o.   MRN: [id number]  HPI The patient comes in today for follow-up of her known obstructive sleep apnea. Wearing C Pap compliantly, and is having no issues with her mask fit or pressure. Her download shows great compliance today, and good control of her AHI. She is pleased with the improvement in her sleep and daytime alertness.   Review of Systems  Constitutional: Negative for fever and unexpected weight change.  HENT: Negative for congestion, dental problem, ear pain, nosebleeds, postnasal drip, rhinorrhea, sinus pressure, sneezing, sore throat and trouble swallowing.   Eyes: Negative for redness and itching.  Respiratory: Negative for cough, chest tightness, shortness of breath and wheezing.   Cardiovascular: Negative for palpitations and leg swelling.  Gastrointestinal: Negative for nausea and vomiting.  Genitourinary: Negative for dysuria.  Musculoskeletal: Negative for joint swelling.  Skin: Negative for rash.  Neurological: Negative for headaches.  Hematological: Does not bruise/bleed easily.  Psychiatric/Behavioral: Negative for dysphoric mood. The patient is not nervous/anxious.        Objective:   Physical Exam Overweight female in no acute distress Nose without purulence or discharge noted No skin breakdown or pressure necrosis from the C Pap mask Neck without lymphadenopathy or thyromegaly Lower extremities without significant edema, no cyanosis Alert and oriented, does not appear to be sleepy, moves all 4 extremities.       Assessment & Plan:

## 2014-12-09 NOTE — Assessment & Plan Note (Signed)
The patient continues to do very well on C Pap, and her download shows excellent compliance and good control of her AHI. She is very happy with her response to therapy, and I've asked her to continue on C Pap and to keep up with her supplies going forward.  I've also encouraged her to work aggressively on weight loss.

## 2014-12-09 NOTE — Patient Instructions (Signed)
followup with me again in one year Keep up with mask cushion changes and supplies. Work on weight loss

## 2014-12-11 ENCOUNTER — Encounter (HOSPITAL_COMMUNITY): Payer: Self-pay | Admitting: *Deleted

## 2014-12-14 ENCOUNTER — Other Ambulatory Visit: Payer: Medicare Other | Admitting: Internal Medicine

## 2014-12-15 ENCOUNTER — Encounter: Payer: Medicare Other | Admitting: Internal Medicine

## 2014-12-21 ENCOUNTER — Ambulatory Visit (HOSPITAL_COMMUNITY)
Admission: RE | Admit: 2014-12-21 | Discharge: 2014-12-21 | Disposition: A | Discharge status: Home / Self Care | Payer: Medicare Other | Source: Ambulatory Visit | Attending: Urology | Admitting: Urology

## 2014-12-21 ENCOUNTER — Ambulatory Visit (HOSPITAL_COMMUNITY): Payer: Medicare Other

## 2014-12-21 ENCOUNTER — Encounter (HOSPITAL_COMMUNITY)
Admission: RE | Disposition: A | Discharge status: Home / Self Care | Payer: Self-pay | Source: Ambulatory Visit | Attending: Urology

## 2014-12-21 ENCOUNTER — Encounter (HOSPITAL_COMMUNITY): Payer: Self-pay | Admitting: General Practice

## 2014-12-21 DIAGNOSIS — R310 Gross hematuria: Secondary | ICD-10-CM | POA: Diagnosis not present

## 2014-12-21 DIAGNOSIS — N200 Calculus of kidney: Secondary | ICD-10-CM

## 2014-12-21 HISTORY — DX: Calculus of kidney: N20.0

## 2014-12-21 HISTORY — DX: Other specified postprocedural states: Z98.890

## 2014-12-21 HISTORY — DX: Sleep apnea, unspecified: G47.30

## 2014-12-21 LAB — GLUCOSE, CAPILLARY: Glucose-Capillary: 131 mg/dL — ABNORMAL HIGH (ref 70–99)

## 2014-12-21 SURGERY — LITHOTRIPSY, ESWL
Anesthesia: LOCAL | Laterality: Right

## 2014-12-21 MED ORDER — DIAZEPAM 5 MG PO TABS
10.0000 mg | ORAL_TABLET | ORAL | Status: AC
  Administered 2014-12-21: 10 mg via ORAL
  Filled 2014-12-21: qty 2

## 2014-12-21 MED ORDER — SODIUM CHLORIDE 0.9 % IV SOLN
INTRAVENOUS | Status: DC
  Administered 2014-12-21: 07:00:00 via INTRAVENOUS

## 2014-12-21 MED ORDER — TAMSULOSIN HCL 0.4 MG PO CAPS: 0.4000 mg | ORAL_CAPSULE | Freq: Every day | ORAL | Status: DC

## 2014-12-21 MED ORDER — DIPHENHYDRAMINE HCL 25 MG PO CAPS
25.0000 mg | ORAL_CAPSULE | ORAL | Status: AC
  Administered 2014-12-21: 25 mg via ORAL
  Filled 2014-12-21: qty 1

## 2014-12-21 MED ORDER — CIPROFLOXACIN HCL 500 MG PO TABS
500.0000 mg | ORAL_TABLET | ORAL | Status: AC
  Administered 2014-12-21: 500 mg via ORAL
  Filled 2014-12-21: qty 1

## 2014-12-21 NOTE — Op Note (Signed)
See [facility] OP note scanned into chart. 

## 2014-12-21 NOTE — Discharge Instructions (Signed)
See [facility] discharge instructions in chart.  

## 2014-12-21 NOTE — Interval H&P Note (Signed)
History and Physical Interval Note:  12/21/2014 7:54 AM  Diana GullySharon E Moat  has presented today for surgery, with the diagnosis of RIGHT KIDNEY STONE  The various methods of treatment have been discussed with the patient and family. After consideration of risks, benefits and other options for treatment, the patient has consented to  Procedure(s): RIGHT EXTRACORPOREAL SHOCK WAVE LITHOTRIPSY (ESWL) (Right) as a surgical intervention .  The patient's history has been reviewed, patient examined, no change in status, stable for surgery.  I have reviewed the patient's chart and labs.  Questions were answered to the patient's satisfaction.     Berniece SalinesHERRICK, BENJAMIN W

## 2014-12-21 NOTE — H&P (Signed)
Reason For Visit hematuria   History of Present Illness Diana George referred by Dr. Marlan PalauMary Baxley, MD for evaluation and management of hematuria. As part of her work-up she has completed a CT A/P revealing a 7mm stone within the collecting systme on the right kdiney. She also has a 2mm stone in the right lower pole. The patient states that the time of her hematuria she had no symptoms of dysuria or flank pain. She had no progressive voiding symptoms including dysuria, frequency, or urgency. The patient has no history of gross hematuria or recurrent urinary tract infections. She has had a stone surgery in the 1970s. Since the occurrence in late December 2015 the patient has not had any ongoing leading. She has no exposure to aniline dyes or organic solvents. She is no family history of GU malignancy. She is otherwise relatively healthy. She has had a nodal ablation for heart 2003.   Review of Systems Genitourinary, constitutional, skin, eye, otolaryngeal, hematologic/lymphatic, cardiovascular, pulmonary, endocrine, musculoskeletal, gastrointestinal, neurological and psychiatric system(s) were reviewed and pertinent findings if present are noted and are otherwise negative.  Genitourinary: urinary urgency, nocturia and hematuria.  Psychiatric: depression and anxiety.    Vitals Vital Signs [Data Includes: Last 1 Day]  Recorded: 08Feb2016 10:37AM  Height: 5 ft 6 in Weight: 185 lb  BMI Calculated: 29.86 BSA Calculated: 1.93 Blood Pressure: 102 / 65 Temperature: 98 F Heart Rate: 71  Physical Exam Constitutional: Well nourished and well developed . No acute distress.  ENT:. The ears and nose are normal in appearance.  Neck: The appearance of the neck is normal and no neck mass is present.  Pulmonary: No respiratory distress and normal respiratory rhythm and effort.  Cardiovascular: Heart rate and rhythm are normal . No peripheral edema.  Abdomen: The abdomen is soft and nontender. No masses are palpated.  No CVA tenderness. No hernias are palpable. No hepatosplenomegaly noted.  Genitourinary:  Chaperone Present: .  Examination of the external genitalia shows normal female external genitalia and no lesions. The urethra is normal in appearance and not tender. There is no urethral mass. Vaginal exam demonstrates no abnormalities. The adnexa are palpably normal. The bladder is non tender and not distended. The anus is normal on inspection. The perineum is normal on inspection.  Lymphatics: The femoral and inguinal nodes are not enlarged or tender.  Skin: Normal skin turgor, no visible rash and no visible skin lesions.  Neuro/Psych:. Mood and affect are appropriate.    Results/Data Urine [Data Includes: Last 1 Day]   08Feb2016  COLOR YELLOW   APPEARANCE CLEAR   SPECIFIC GRAVITY 1.030   pH 5.0   GLUCOSE NEG mg/dL  BILIRUBIN NEG   KETONE NEG mg/dL  BLOOD LARGE   PROTEIN NEG mg/dL  UROBILINOGEN 0.2 mg/dL  NITRITE NEG   LEUKOCYTE ESTERASE NEGATIVE   SQUAMOUS EPITHELIAL/HPF RARE   WBC 0-2 WBC/hpf  RBC 11-20 RBC/hpf  BACTERIA NONE SEEN   CRYSTALS NONE SEEN   CASTS NONE SEEN    Patient's urinalysis today demonstrates microscopic hematuria   Procedure  Procedure:1  Cystoscopy1   Chaperone Present: ashley1 .  Indication:1  Hematuria1 .  Informed Consent: 1  from the patient1  . Specific risks including, but not limited to bleeding, infection, pain, allergic reaction etc. were explained1 .  Prep:1  The patient was prepped with hibiclens1 .  Anesthesia:1 . Local anesthesia was administered intraurethrally with 2% lidocaine jelly1 .  Antibiotic prophylaxis:1  Ciprofloxacin1 .  Procedure Note:  Urethral meatus:1 .  No abnormalities1 .  Bladder: Visulization was1  clear1 . The ureteral orifices1  were in the normal anatomic position bilaterally1  and had clear efflux of urine1 . A systematic survey of the bladder demonstrated no bladder tumors or stones1  The mucosa was smooth without  abnormalities1  The patient tolerated the procedure well1   Complications:1  None1 .     1 Amended By: Berniece Salines; Dec 09 2014 9:37 PM EST  Assessment Assessed  1. Gross hematuria (R31.0)  Gross hematuria, etiology likely 7 mm stone at the right UPJ as her cystoscopy and hematuria protocol CT scan were otherwise negative.   Plan Gross hematuria  1. URINE CULTURE; Status:Hold For - Specimen/Data Collection,Appointment; Requested  for:08Feb2016;  2. URINE CYTOLOGY; Status:Hold For - Specimen/Data Collection,Appointment;  Requested for:08Feb2016;  3. Follow-up Schedule Surgery Office  Follow-up  Status: Hold For - Appointment   Requested for: 08Feb2016 Health Maintenance  4. UA With REFLEX; [Do Not Release]; Status:Complete;   Done: 08Feb2016 10:09AM  Discussion/Summary We discussed the management options for her 7 mm stone as well as the other much smaller stone in the right kidney. I detailed to her both ureteroscopy and shockwave lithotripsy. We also discussed surveillance. Having gone over all 3 options the patient has chosen shockwave lithotripsy to initially handle her 7 mm stone. She understands that she may need additional procedures in the stone clearance rate is not as good as ureteroscopy. She also understands the risks of requiring an additional procedure as well as perinephric hematoma. We'll get this scheduled for her at her convenience.

## 2015-01-11 ENCOUNTER — Other Ambulatory Visit: Payer: Medicare Other | Admitting: Internal Medicine

## 2015-01-11 DIAGNOSIS — E785 Hyperlipidemia, unspecified: Secondary | ICD-10-CM

## 2015-01-11 DIAGNOSIS — E559 Vitamin D deficiency, unspecified: Secondary | ICD-10-CM

## 2015-01-11 DIAGNOSIS — Z79899 Other long term (current) drug therapy: Secondary | ICD-10-CM

## 2015-01-11 DIAGNOSIS — R5383 Other fatigue: Secondary | ICD-10-CM

## 2015-01-11 DIAGNOSIS — E119 Type 2 diabetes mellitus without complications: Secondary | ICD-10-CM

## 2015-01-11 LAB — COMPLETE METABOLIC PANEL WITH GFR
ALBUMIN: 4 g/dL (ref 3.5–5.2)
ALT: 11 U/L (ref 0–35)
AST: 13 U/L (ref 0–37)
Alkaline Phosphatase: 70 U/L (ref 39–117)
BUN: 22 mg/dL (ref 6–23)
CALCIUM: 9.2 mg/dL (ref 8.4–10.5)
CHLORIDE: 104 meq/L (ref 96–112)
CO2: 22 meq/L (ref 19–32)
Creat: 1.19 mg/dL — ABNORMAL HIGH (ref 0.50–1.10)
GFR, Est African American: 54 mL/min — ABNORMAL LOW
GFR, Est Non African American: 47 mL/min — ABNORMAL LOW
GLUCOSE: 146 mg/dL — AB (ref 70–99)
Potassium: 4.2 mEq/L (ref 3.5–5.3)
SODIUM: 140 meq/L (ref 135–145)
TOTAL PROTEIN: 6.4 g/dL (ref 6.0–8.3)
Total Bilirubin: 0.7 mg/dL (ref 0.2–1.2)

## 2015-01-11 LAB — LIPID PANEL
Cholesterol: 152 mg/dL (ref 0–200)
HDL: 45 mg/dL — ABNORMAL LOW (ref >=46)
LDL Cholesterol: 86 mg/dL (ref 0–99)
Total CHOL/HDL Ratio: 3.4 Ratio
Triglycerides: 104 mg/dL (ref <150)
VLDL: 21 mg/dL (ref 0–40)

## 2015-01-11 LAB — TSH: TSH: 2.076 u[IU]/mL (ref 0.350–4.500)

## 2015-01-11 LAB — HEMOGLOBIN A1C
HEMOGLOBIN A1C: 6.3 % — AB (ref <5.7)
Mean Plasma Glucose: 134 mg/dL — ABNORMAL HIGH (ref <117)

## 2015-01-11 NOTE — Addendum Note (Signed)
Addended by: Thomasena EdisWILLIAMS, Tayva Easterday N on: 01/11/2015 08:59 AM   Modules accepted: Orders

## 2015-01-12 ENCOUNTER — Encounter: Payer: Self-pay | Admitting: Internal Medicine

## 2015-01-12 ENCOUNTER — Ambulatory Visit (INDEPENDENT_AMBULATORY_CARE_PROVIDER_SITE_OTHER): Payer: Medicare Other | Admitting: Internal Medicine

## 2015-01-12 VITALS — BP 104/64 | HR 102 | Temp 98.7°F | Ht 66.5 in | Wt 187.0 lb

## 2015-01-12 DIAGNOSIS — Z13820 Encounter for screening for osteoporosis: Secondary | ICD-10-CM

## 2015-01-12 DIAGNOSIS — F329 Major depressive disorder, single episode, unspecified: Secondary | ICD-10-CM

## 2015-01-12 DIAGNOSIS — Z0000 Encounter for general adult medical examination without abnormal findings: Secondary | ICD-10-CM | POA: Diagnosis not present

## 2015-01-12 DIAGNOSIS — F32A Depression, unspecified: Secondary | ICD-10-CM

## 2015-01-12 DIAGNOSIS — Z87442 Personal history of urinary calculi: Secondary | ICD-10-CM | POA: Diagnosis not present

## 2015-01-12 DIAGNOSIS — J309 Allergic rhinitis, unspecified: Secondary | ICD-10-CM | POA: Diagnosis not present

## 2015-01-12 DIAGNOSIS — J069 Acute upper respiratory infection, unspecified: Secondary | ICD-10-CM | POA: Diagnosis not present

## 2015-01-12 DIAGNOSIS — F418 Other specified anxiety disorders: Secondary | ICD-10-CM

## 2015-01-12 DIAGNOSIS — E119 Type 2 diabetes mellitus without complications: Secondary | ICD-10-CM

## 2015-01-12 DIAGNOSIS — E785 Hyperlipidemia, unspecified: Secondary | ICD-10-CM

## 2015-01-12 DIAGNOSIS — F419 Anxiety disorder, unspecified: Secondary | ICD-10-CM

## 2015-01-12 DIAGNOSIS — G4733 Obstructive sleep apnea (adult) (pediatric): Secondary | ICD-10-CM

## 2015-01-12 LAB — CBC WITH DIFFERENTIAL/PLATELET
BASOS PCT: 0 % (ref 0–1)
Basophils Absolute: 0 10*3/uL (ref 0.0–0.1)
EOS PCT: 2 % (ref 0–5)
Eosinophils Absolute: 0.1 10*3/uL (ref 0.0–0.7)
HCT: 41.4 % (ref 36.0–46.0)
Hemoglobin: 13.4 g/dL (ref 12.0–15.0)
Lymphocytes Relative: 27 % (ref 12–46)
Lymphs Abs: 2 10*3/uL (ref 0.7–4.0)
MCH: 30.5 pg (ref 26.0–34.0)
MCHC: 32.4 g/dL (ref 30.0–36.0)
MCV: 94.3 fL (ref 78.0–100.0)
MPV: 11 fL (ref 8.6–12.4)
Monocytes Absolute: 0.9 10*3/uL (ref 0.1–1.0)
Monocytes Relative: 12 % (ref 3–12)
Neutro Abs: 4.3 10*3/uL (ref 1.7–7.7)
Neutrophils Relative %: 59 % (ref 43–77)
Platelets: 383 10*3/uL (ref 150–400)
RBC: 4.39 MIL/uL (ref 3.87–5.11)
RDW: 13.2 % (ref 11.5–15.5)
WBC: 7.3 10*3/uL (ref 4.0–10.5)

## 2015-01-12 LAB — VITAMIN D 25 HYDROXY (VIT D DEFICIENCY, FRACTURES): Vit D, 25-Hydroxy: 44 ng/mL (ref 30–100)

## 2015-01-12 MED ORDER — AZITHROMYCIN 250 MG PO TABS: ORAL_TABLET | ORAL | Status: DC

## 2015-01-13 LAB — MICROALBUMIN / CREATININE URINE RATIO
Creatinine, Urine: 233.3 mg/dL
Microalb Creat Ratio: 7.3 mg/g (ref 0.0–30.0)
Microalb, Ur: 1.7 mg/dL (ref <2.0)

## 2015-01-16 ENCOUNTER — Encounter: Payer: Self-pay | Admitting: Internal Medicine

## 2015-01-16 NOTE — Progress Notes (Signed)
Subjective:    Patient ID: Diana GullySharon E Berghuis, female    DOB: 08/15/1946, 69 y.o.   MRN: [id number]  HPI    69 year old White Female in today for health maintenance exam and evaluation of multiple medical issues including hyperlipidemia, type 2 diabetes mellitus, anxiety depression. New issue today is acute upper respiratory infection with cough and congestion.  Recently underwent lithotripsy for kidney stones- was found to have one in right renal pelvis and one in right lower pole of kidney.  Past medical history: Bilateral tubal ligation 1980, stone basket extraction for kidney stone in the mid 1970s, fractured left wrist November 2007 due to a fall in addition to a right anterior cruciate ligament tear and meniscal tear. Cataract extraction left eye March 2011. Normal Cardiolite study in 2002. 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 This study was done for syncope. She had mild mitral regurgitation. She was seen by Dr. Sharrell KuGreg Taylor in 2005 for recurrent SVT. She had catheter ablation September 2005.  She had allergy evaluation 2002 with significant reactions to various tree pollens, house dust, dust mites. Mild reaction to molds. No food allergies. Sprock treat that time was normal.  She is allergic to Amoxicillin-- causes a rash.   Social history: She is divorced. Resides alone. She is an only child. 2 adult children. Does not smoke or consume alcohol. 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  Family history: In April 2014, father died of septicemia with history of MI, coronary artery disease and hematoma of the brain. He was 69 years old. Mother died August 2014 with history of dementia and urinary tract infection at age 69.    Review of Systems  Constitutional: Positive for fatigue.  HENT: Positive for congestion and sinus pressure.   Respiratory: Positive for cough.        Objective:   Physical Exam  Constitutional: She is  oriented to person, place, and time. She appears well-developed and well-nourished. No distress.  HENT:  Head: Normocephalic and atraumatic.  Right Ear: External ear normal.  Left Ear: External ear normal.  Mouth/Throat: Oropharynx is clear and moist. No oropharyngeal exudate.  Eyes: Conjunctivae are normal. Pupils are equal, round, and reactive to light. Right eye exhibits no discharge. Left eye exhibits no discharge. No scleral icterus.  Neck: Neck supple. No JVD present. No thyromegaly present.  Cardiovascular: Normal rate, regular rhythm, normal heart sounds and intact distal pulses.   No murmur heard. Breasts normal female without masses  Pulmonary/Chest: Effort normal and breath sounds normal. No respiratory distress. She has no wheezes. She has no rales. She exhibits no tenderness.  Breasts normal female without masses  Abdominal: Soft. Bowel sounds are normal. She exhibits no distension and no mass. There is no tenderness. There is no rebound and no guarding.  Genitourinary:  Deferred  Musculoskeletal: Normal range of motion. She exhibits no edema.  Lymphadenopathy:    She has no cervical adenopathy.  Neurological: She is alert and oriented to person, place, and time. She has normal reflexes. No cranial nerve deficit. Coordination normal.  Skin: Skin is warm. No rash noted. She is not diaphoretic.  Psychiatric: She has a normal mood and affect. Her behavior is normal. Judgment and thought content normal.  Vitals reviewed.         Assessment & Plan:  Type 2 diabetes mellitus-stable on medication  Anxiety depression-stable on medication  History of kidney stones-status post lithotripsy by urologist  Hyperlipidemia-treated with statin medication  Sleep apnea-seen by pulmonology  Acute URI-treated with Zithromax Z-PAK today  Plan: Return in 6 months. Need to follow-up on screening colonoscopy. No recent colonoscopy on file. Needs annual mammogram. No recent bone density  study on file.  Subjective:   Patient presents for Medicare Annual/Subsequent preventive examination.  Review Past Medical/Family/Social: See above   Risk Factors  Current exercise habits: Sedentary Dietary issues discussed: Low fat low carb  Cardiac risk factors: Family history, diabetes, hyperlipidemia  Depression Screen  (Note: if answer to either of the following is "Yes", a more complete depression screening is indicated)   Over the past two weeks, have you felt down, depressed or hopeless? No  Over the past two weeks, have you felt little interest or pleasure in doing things? No Have you lost interest or pleasure in daily life? No Do you often feel hopeless? No Do you cry easily over simple problems? No   Activities of Daily Living  In your present state of health, do you have any difficulty performing the following activities?:   Driving? No  Managing money? No  Feeding yourself? No  Getting from bed to chair? No  Climbing a flight of stairs? No  Preparing food and eating?: No  Bathing or showering? No  Getting dressed: No  Getting to the toilet? No  Using the toilet:No  Moving around from place to place: No  In the past year have you fallen or had a near fall?:No  Are you sexually active? No  Do you have more than one partner? No   Hearing Difficulties: No  Do you often ask people to speak up or repeat themselves? No  Do you experience ringing or noises in your ears? No  Do you have difficulty understanding soft or whispered voices? No  Do you feel that you have a problem with memory? No Do you often misplace items? No    Home Safety:  Do you have a smoke alarm at your residence? Yes Do you have grab bars in the bathroom? no Do you have throw rugs in your house? yes   Cognitive Testing  Alert? Yes Normal Appearance?Yes  Oriented to person? Yes Place? Yes  Time? Yes  Recall of three objects? Yes  Can perform simple calculations? Yes  Displays  appropriate judgment?Yes  Can read the correct time from a watch face?Yes   List the Names of Other Physician/Practitioners you currently use:  See referral list for the physicians patient is currently seeing.  Urologist   Review of Systems: See above   Objective:     General appearance: Appears stated age and mildly obese  Head: Normocephalic, without obvious abnormality, atraumatic  Eyes: conj clear, EOMi PEERLA  Ears: normal TM's and external ear canals both ears  Nose: Nares normal. Septum midline. Mucosa normal. No drainage or sinus tenderness.  Throat: lips, mucosa, and tongue normal; teeth and gums normal  Neck: no adenopathy, no carotid bruit, no JVD, supple, symmetrical, trachea midline and thyroid not enlarged, symmetric, no tenderness/mass/nodules  No CVA tenderness.  Lungs: clear to auscultation bilaterally  Breasts: normal appearance, no masses or tenderness Heart: regular rate and rhythm, S1, S2 normal, no murmur, click, rub or gallop  Abdomen: soft, non-tender; bowel sounds normal; no masses, no organomegaly  Musculoskeletal: ROM normal in all joints, no crepitus, no deformity, Normal muscle strengthen. Back  is symmetric, no curvature. Skin: Skin color, texture, turgor normal. No rashes or lesions  Lymph nodes: Cervical, supraclavicular,  and axillary nodes normal.  Neurologic: CN 2 -12 Normal, Normal symmetric reflexes. Normal coordination and gait  Psych: Alert & Oriented x 3, Mood appear stable.    Assessment:    Annual wellness medicare exam   Plan:    During the course of the visit the patient was educated and counseled about appropriate screening and preventive services including:   Needs annual mammogram. Needs bone density study. Check regarding screening colonoscopy. Not had one recently.     Patient Instructions (the written plan) was given to the patient.  Medicare Attestation  I have personally reviewed:  The patient's medical and social  history  Their use of alcohol, tobacco or illicit drugs  Their current medications and supplements  The patient's functional ability including ADLs,fall risks, home safety risks, cognitive, and hearing and visual impairment  Diet and physical activities  Evidence for depression or mood disorders  The patient's weight, height, BMI, and visual acuity have been recorded in the chart. I have made referrals, counseling, and provided education to the patient based on review of the above and I have provided the patient with a written personalized care plan for preventive services.

## 2015-01-16 NOTE — Patient Instructions (Addendum)
Diet and exercise. Continue same medications and return in 6 months. Take Zithromax Z-PAK instructed for respiratory infection.

## 2015-01-18 ENCOUNTER — Telehealth: Payer: Self-pay | Admitting: *Deleted

## 2015-01-18 DIAGNOSIS — Z1211 Encounter for screening for malignant neoplasm of colon: Secondary | ICD-10-CM

## 2015-01-18 NOTE — Telephone Encounter (Signed)
Referral placed for colonoscopy. 

## 2015-02-01 ENCOUNTER — Encounter: Payer: Self-pay | Admitting: Internal Medicine

## 2015-02-02 ENCOUNTER — Other Ambulatory Visit: Payer: Self-pay | Admitting: Internal Medicine

## 2015-02-02 DIAGNOSIS — E2839 Other primary ovarian failure: Secondary | ICD-10-CM

## 2015-02-02 DIAGNOSIS — E559 Vitamin D deficiency, unspecified: Secondary | ICD-10-CM

## 2015-02-04 LAB — HM DIABETES EYE EXAM

## 2015-02-10 ENCOUNTER — Other Ambulatory Visit: Payer: Self-pay | Admitting: *Deleted

## 2015-02-10 MED ORDER — CLORAZEPATE DIPOTASSIUM 3.75 MG PO TABS: 3.7500 mg | ORAL_TABLET | Freq: Two times a day (BID) | ORAL | Status: DC

## 2015-02-10 MED ORDER — SIMVASTATIN 20 MG PO TABS: 20.0000 mg | ORAL_TABLET | Freq: Every day | ORAL | Status: DC

## 2015-02-11 ENCOUNTER — Other Ambulatory Visit: Payer: Self-pay | Admitting: *Deleted

## 2015-02-11 ENCOUNTER — Other Ambulatory Visit: Payer: Medicare Other | Admitting: Internal Medicine

## 2015-02-11 MED ORDER — SITAGLIPTIN PHOSPHATE 100 MG PO TABS: 100.0000 mg | ORAL_TABLET | Freq: Every day | ORAL | Status: DC

## 2015-02-12 ENCOUNTER — Ambulatory Visit: Payer: Medicare Other | Admitting: Internal Medicine

## 2015-02-22 ENCOUNTER — Other Ambulatory Visit: Payer: Medicare Other | Admitting: Internal Medicine

## 2015-02-22 DIAGNOSIS — Z79899 Other long term (current) drug therapy: Secondary | ICD-10-CM

## 2015-02-22 LAB — BASIC METABOLIC PANEL
BUN: 31 mg/dL — AB (ref 6–23)
CHLORIDE: 106 meq/L (ref 96–112)
CO2: 23 mEq/L (ref 19–32)
Calcium: 8.9 mg/dL (ref 8.4–10.5)
Creat: 1.24 mg/dL — ABNORMAL HIGH (ref 0.50–1.10)
GLUCOSE: 154 mg/dL — AB (ref 70–99)
POTASSIUM: 4.5 meq/L (ref 3.5–5.3)
SODIUM: 140 meq/L (ref 135–145)

## 2015-02-23 ENCOUNTER — Encounter: Payer: Self-pay | Admitting: Internal Medicine

## 2015-02-23 ENCOUNTER — Other Ambulatory Visit: Payer: Self-pay | Admitting: *Deleted

## 2015-02-23 ENCOUNTER — Ambulatory Visit (INDEPENDENT_AMBULATORY_CARE_PROVIDER_SITE_OTHER): Payer: Medicare Other | Admitting: Internal Medicine

## 2015-02-23 VITALS — BP 116/76 | HR 74 | Temp 98.5°F | Wt 191.0 lb

## 2015-02-23 DIAGNOSIS — R7989 Other specified abnormal findings of blood chemistry: Secondary | ICD-10-CM

## 2015-02-23 DIAGNOSIS — R748 Abnormal levels of other serum enzymes: Secondary | ICD-10-CM

## 2015-02-23 DIAGNOSIS — F411 Generalized anxiety disorder: Secondary | ICD-10-CM

## 2015-02-23 DIAGNOSIS — E785 Hyperlipidemia, unspecified: Secondary | ICD-10-CM | POA: Diagnosis not present

## 2015-02-23 DIAGNOSIS — E119 Type 2 diabetes mellitus without complications: Secondary | ICD-10-CM | POA: Diagnosis not present

## 2015-02-23 LAB — POCT URINALYSIS DIPSTICK
Bilirubin, UA: NEGATIVE
Blood, UA: NEGATIVE
Glucose, UA: NEGATIVE
KETONES UA: NEGATIVE
Nitrite, UA: NEGATIVE
PROTEIN UA: NEGATIVE
UROBILINOGEN UA: NEGATIVE
pH, UA: 5

## 2015-02-23 MED ORDER — SERTRALINE HCL 100 MG PO TABS: 100.0000 mg | ORAL_TABLET | Freq: Every day | ORAL | Status: DC

## 2015-02-23 MED ORDER — BUPROPION HCL ER (XL) 150 MG PO TB24: 150.0000 mg | ORAL_TABLET | Freq: Every day | ORAL | Status: DC

## 2015-02-23 NOTE — Progress Notes (Signed)
   Subjective:    Patient ID: Diana George, female    DOB: January 05, 1946, 69 y.o.   MRN: [id number]  HPI At last visit patient had creatinine of 1.19. This is been repeated recently and was 1.24. She has a history of calcium oxalate kidney stones status post lithotripsy. She also has diabetes mellitus but recently  under fairly good control. No history of hypertension. She is not on an ACE inhibitor at the present time. Hemoglobin A1c results generally have ranged from 6.0-6.9%    Review of Systems     Objective:   Physical Exam   Spent 15 minutes speaking with patient about lab results and causes. Urinalysis today is within normal.     Assessment & Plan:  Elevated serum creatinine-possible chronic kidney disease  Controlled type 2 diabetes mellitus  Hyperlipidemia  History of calcium oxalate kidney stones requiring lithotripsy  Plan: Appointment with [facility] for further evaluation. Urinalysis done today. I have not started her on an ACE inhibitor

## 2015-02-23 NOTE — Patient Instructions (Addendum)
Appt with [facility] for evaluation of elevated serum creatinine

## 2015-03-03 ENCOUNTER — Telehealth: Payer: Self-pay | Admitting: *Deleted

## 2015-03-03 NOTE — Telephone Encounter (Signed)
Faxed over information for referral awaiting response from [facility] regarding appt time

## 2015-03-26 ENCOUNTER — Ambulatory Visit (AMBULATORY_SURGERY_CENTER): Payer: Self-pay | Admitting: *Deleted

## 2015-03-26 VITALS — Ht 66.5 in | Wt 189.8 lb

## 2015-03-26 DIAGNOSIS — Z1211 Encounter for screening for malignant neoplasm of colon: Secondary | ICD-10-CM

## 2015-03-26 NOTE — Progress Notes (Signed)
No allergies to eggs or soy. No problems with anesthesia.  Pt given Emmi instructions for colonoscopy  No oxygen use  No diet drug use  

## 2015-04-02 ENCOUNTER — Telehealth: Payer: Self-pay | Admitting: Internal Medicine

## 2015-04-02 NOTE — Telephone Encounter (Signed)
Patient is calling about her referral to [facility].  She has called their office.  We have completed our part of the referral to them.  She then called them to see where she was on their appointment status.  They RATE their patients based on their diagnosis and then book their appointment accordingly.  She has been rated 3-4 and they told her that it would be 4-5 MONTHS before they would even be able to provide her with an appointment.  So, she currently doesn't even have an appointment.  She is quite concerned and fretted that she doesn't have an appointment and worried about her kidney function in that it changed in a months time.  She wants to know if you feel that is an appropriate waiting time for an appointment to see a kidney specialist?  And, if you do, she'll wait patiently and let it go.  Or should she be referred out to another kidney specialist to be seen sooner?    Please advise.

## 2015-04-02 NOTE — Telephone Encounter (Signed)
Yes it can wait. Can ask to be put on cancellation list.

## 2015-04-02 NOTE — Telephone Encounter (Signed)
Advised patient to ask to be put on cancellation list . Assured her it is OK to wait her labs were not critical. Patient verbalized understanding

## 2015-04-09 ENCOUNTER — Encounter: Payer: Self-pay | Admitting: Internal Medicine

## 2015-04-09 ENCOUNTER — Ambulatory Visit (AMBULATORY_SURGERY_CENTER): Payer: Medicare Other | Admitting: Internal Medicine

## 2015-04-09 VITALS — BP 119/74 | HR 82 | Temp 97.7°F | Resp 13 | Ht 66.0 in | Wt 189.0 lb

## 2015-04-09 DIAGNOSIS — Z1211 Encounter for screening for malignant neoplasm of colon: Secondary | ICD-10-CM | POA: Diagnosis not present

## 2015-04-09 LAB — GLUCOSE, CAPILLARY
GLUCOSE-CAPILLARY: 91 mg/dL (ref 65–99)
Glucose-Capillary: 99 mg/dL (ref 65–99)

## 2015-04-09 MED ORDER — SODIUM CHLORIDE 0.9 % IV SOLN: 500.0000 mL | INTRAVENOUS | Status: DC

## 2015-04-09 NOTE — Progress Notes (Signed)
Report to PACU, RN, vss, BBS= Clear.  

## 2015-04-09 NOTE — Patient Instructions (Signed)
YOU HAD AN ENDOSCOPIC PROCEDURE TODAY AT THE [facility]:   Refer to the procedure report that was given to you for any specific questions about what was found during the examination.  If the procedure report does not answer your questions, please call your gastroenterologist to clarify.  If you requested that your care partner not be given the details of your procedure findings, then the procedure report has been included in a sealed envelope for you to review at your convenience later.  YOU SHOULD EXPECT: Some feelings of bloating in the abdomen. Passage of more gas than usual.  Walking can help get rid of the air that was put into your GI tract during the procedure and reduce the bloating. If you had a lower endoscopy (such as a colonoscopy or flexible sigmoidoscopy) you may notice spotting of blood in your stool or on the toilet paper. If you underwent a bowel prep for your procedure, you may not have a normal bowel movement for a few days.  Please Note:  You might notice some irritation and congestion in your nose or some drainage.  This is from the oxygen used during your procedure.  There is no need for concern and it should clear up in a day or so.  SYMPTOMS TO REPORT IMMEDIATELY:   Following lower endoscopy (colonoscopy or flexible sigmoidoscopy):  Excessive amounts of blood in the stool  Significant tenderness or worsening of abdominal pains  Swelling of the abdomen that is new, acute  Fever of 100F or higher  For urgent or emergent issues, a gastroenterologist can be reached at any hour by calling [phone].   DIET: Your first meal following the procedure should be a small meal and then it is ok to progress to your normal diet. Heavy or fried foods are harder to digest and may make you feel nauseous or bloated.  Likewise, meals heavy in dairy and vegetables can increase bloating.  Drink plenty of fluids but you should avoid alcoholic beverages for 24  hours.  ACTIVITY:  You should plan to take it easy for the rest of today and you should NOT DRIVE or use heavy machinery until tomorrow (because of the sedation medicines used during the test).    FOLLOW UP: Our staff will call the number listed on your records the next business day following your procedure to check on you and address any questions or concerns that you may have regarding the information given to you following your procedure. If we do not reach you, we will leave a message.  However, if you are feeling well and you are not experiencing any problems, there is no need to return our call.  We will assume that you have returned to your regular daily activities without incident.  If any biopsies were taken you will be contacted by phone or by letter within the next 1-3 weeks.  Please call us at [phone] if you have not heard about the biopsies in 3 weeks.    SIGNATURES/CONFIDENTIALITY: You and/or your care partner have signed paperwork which will be entered into your electronic medical record.  These signatures attest to the fact that that the information above on your After Visit Summary has been reviewed and is understood.  Full responsibility of the confidentiality of this discharge information lies with you and/or your care-partner.  High fiber diet-handouts given  Repeat colonoscopy in 10 years 2026.

## 2015-04-09 NOTE — Op Note (Signed)
[facility] [address] [place], [address]   COLONOSCOPY PROCEDURE REPORT  PATIENT: Diana George, Diana George  MR#: [id number] BIRTHDATE: [birth year] , 68  yrs. old GENDER: female ENDOSCOPIST: Hart Carwin, MD REFERRED OX:[facility] Waymond Cera, M.D. PROCEDURE DATE:  04/09/2015 PROCEDURE:   Colonoscopy, screening First Screening Colonoscopy - Avg.  risk and is 50 yrs.  old or older - No.  Prior Negative Screening - Now for repeat screening. 10 or more years since last screening  History of Adenoma - Now for follow-up colonoscopy & has been > or = to 3 yrs.  N/A  Polyps removed today? No ASA CLASS:   Class II INDICATIONS:Screening for colonic neoplasia, Colorectal Neoplasm Risk Assessment for this procedure is average risk, and prior colonoscopy in 1999 in another facility records not available. MEDICATIONS: Monitored anesthesia care and Propofol 200 mg IV  DESCRIPTION OF PROCEDURE:   After the risks benefits and alternatives of the procedure were thoroughly explained, informed consent was obtained.  The digital rectal exam revealed no abnormalities of the rectum.   The LB [id number]  endoscope was introduced through the anus and advanced to the cecum, which was identified by both the appendix and ileocecal valve. No adverse events experienced.   The quality of the prep was excellent. (MoviPrep was used)  The instrument was then slowly withdrawn as the colon was fully examined. Estimated blood loss is zero unless otherwise noted in this procedure report.      COLON FINDINGS: A normal appearing cecum, ileocecal valve, and appendiceal orifice were identified.  The ascending, transverse, descending, sigmoid colon, and rectum appeared unremarkable. Retroflexed views revealed no abnormalities. The time to cecum = 6.52 Withdrawal time = 6.57   The scope was withdrawn and the procedure completed. COMPLICATIONS: There were no immediate complications.  ENDOSCOPIC  IMPRESSION: Normal colonoscopy  RECOMMENDATIONS: High fiber diet Recall colonoscopy in 10 years  eSigned:  Hart Carwin, MD 04/09/2015 11:35 AM   cc:

## 2015-04-12 ENCOUNTER — Telehealth: Payer: Self-pay

## 2015-04-12 NOTE — Telephone Encounter (Signed)
  Follow up Call-  Call back number 04/09/2015  Post procedure Call Back phone  # [phone]  Permission to leave phone message Yes     Patient questions:  Do you have a fever, pain , or abdominal swelling? No. Pain Score  0 *  Have you tolerated food without any problems? Yes.    Have you been able to return to your normal activities? Yes.    Do you have any questions about your discharge instructions: Diet   No. Medications  No. Follow up visit  No.  Do you have questions or concerns about your Care? No.  Actions: * If pain score is 4 or above: No action needed, pain <4.

## 2015-05-13 ENCOUNTER — Telehealth: Payer: Self-pay | Admitting: Internal Medicine

## 2015-05-13 NOTE — Telephone Encounter (Signed)
Spoke with patient gave her information for ortho

## 2015-05-13 NOTE — Telephone Encounter (Signed)
Patient would like to talk to the doctor about her recurrent knee problem.  The Ortho gave her Meloxican, but Dr. Lenord FellersBaxley said don't take it anymore.  She returns to the Ortho tomorrow and she knows he is going to ask why she is not taking the medication he prescribed and she would like to know what to tell him.

## 2015-05-13 NOTE — Telephone Encounter (Signed)
Because of Chronic kidney disease pending evaluation

## 2015-06-04 ENCOUNTER — Encounter: Payer: Self-pay | Admitting: *Deleted

## 2015-06-18 ENCOUNTER — Other Ambulatory Visit: Payer: Self-pay | Admitting: Internal Medicine

## 2015-06-18 NOTE — Telephone Encounter (Signed)
Refill x one year °

## 2015-06-22 ENCOUNTER — Telehealth: Payer: Self-pay | Admitting: Internal Medicine

## 2015-06-22 NOTE — Telephone Encounter (Signed)
Fax from [employer] [employer] with Appointment Date.  Patient to see Dr. Dyke Maes 07/12/15 @ 10:45.   Arrive 10:15 a.m. Patient referred in May Fax any information to [place] @ [phone]

## 2015-06-25 ENCOUNTER — Encounter: Payer: Self-pay | Admitting: Internal Medicine

## 2015-06-28 ENCOUNTER — Ambulatory Visit: Payer: Medicare Other | Attending: Orthopaedic Surgery | Admitting: Physical Therapy

## 2015-06-28 ENCOUNTER — Encounter: Payer: Self-pay | Admitting: Physical Therapy

## 2015-06-28 DIAGNOSIS — R262 Difficulty in walking, not elsewhere classified: Secondary | ICD-10-CM

## 2015-06-28 DIAGNOSIS — M25561 Pain in right knee: Secondary | ICD-10-CM | POA: Diagnosis not present

## 2015-06-28 NOTE — Therapy (Signed)
[facility] [address] [place], [place], [address] Phone: [phone]   Fax:  [phone]  Physical Therapy Evaluation  Patient Details  Name: Diana George MRN: [id number] Date of Birth: July 29, 1946 Referring Provider:  Marcene Corning, MD  Encounter Date: 06/28/2015      PT End of Session - 06/28/15 1136    Visit Number 1   Date for PT Re-Evaluation 08/28/15   PT Start Time 1056   PT Stop Time 1154   PT Time Calculation (min) 58 min   Activity Tolerance Patient limited by pain   Behavior During Therapy [facility] for tasks assessed/performed      Past Medical History  Diagnosis Date  . Anxiety   . Depression   . Diabetes mellitus   . Hyperlipidemia   . History of coronary rotational ablation 2006  . Renal stone   . Sleep apnea     uses cpap    Past Surgical History  Procedure Laterality Date  . Tubal ligation  1977  . Cataract extraction w/ intraocular lens  implant, bilateral  2010  . Leg surgery Left 2011    fracture with hardware  . Stone extraction with basket  2016, [id number]  . Heart ablation  2010    There were no vitals filed for this visit.  Visit Diagnosis:  Right knee pain - Plan: PT plan of care cert/re-cert  Difficulty walking - Plan: PT plan of care cert/re-cert      Subjective Assessment - 06/28/15 1057    Subjective Patient reports that she had arthroscopy on 06/10/15.  Had medial meniscus tear.  She reports that she had been having some significant pain since June.   Limitations Walking;Standing;House hold activities;Lifting   How long can you stand comfortably? 10   How long can you walk comfortably? 10   Patient Stated Goals less pain   Currently in Pain? Yes   Pain Score 6    Pain Location Knee  c/o some pain in the right shin   Pain Orientation Right;Anterior   Pain Descriptors / Indicators Aching   Pain Type Surgical pain   Pain Onset 1 to 4 weeks ago   Pain Frequency  Constant   Aggravating Factors  stairs, being up on it, lifting, at worst 10/10   Pain Relieving Factors pain meds help pain at best a 2/10   Effect of Pain on Daily Activities limits everything, just really hurts            [facility] PT Assessment - 06/28/15 0001    Assessment   Medical Diagnosis s/p right knee scope   Onset Date/Surgical Date 06/10/15   Prior Therapy no   Precautions   Precautions None   Restrictions   Weight Bearing Restrictions No   Balance Screen   Has the patient fallen in the past 6 months No   Has the patient had a decrease in activity level because of a fear of falling?  No   Is the patient reluctant to leave their home because of a fear of falling?  No   Home Environment   Living Environment Private residence   Additional Comments has stairs, does her own housework   Prior Function   Level of Independence Independent   Leisure not muxh exercise   AROM   Overall AROM Comments AROM of the right knee 5-116 degrees   Strength   Overall Strength Comments 4-/5 with pain for extension   Flexibility  Soft Tissue Assessment /Muscle Length --  tight HS and ITB   Palpation   Palpation comment mild warmth of the right knee, very poor quad contraction, minimal VMO activity   Ambulation/Gait   Gait Comments no device, slow and antalgic on the right, gaurded with movements                   [facility] Adult PT Treatment/Exercise - 09-Jul-2015 0001    Knee/Hip Exercises: Aerobic   Stationary Bike 5 minutes   Nustep Level 4 5 minutes   Knee/Hip Exercises: Supine   Quad Sets 2 sets;10 reps;Right   Short Arc Quad Sets 2 sets   Modalities   Modalities Cryotherapy;Electrical Stimulation   Cryotherapy   Number Minutes Cryotherapy 15 Minutes   Cryotherapy Location Knee   Type of Cryotherapy Ice pack   Electrical Stimulation   Electrical Stimulation Location right knee   Electrical Stimulation Action IFC   Electrical Stimulation Parameters in elevation    Electrical Stimulation Goals Pain                  PT Short Term Goals - 07/09/2015 1138    PT SHORT TERM GOAL #1   Title independent with initial HEP   Time 2   Period Weeks   Status New           PT Long Term Goals - Jul 09, 2015 1139    PT LONG TERM GOAL #1   Title report pain decreased 50%   Time 8   Period Weeks   Status New   PT LONG TERM GOAL #2   Title increase AROM of the right knee to WNL's   Time 8   Period Weeks   Status New   PT LONG TERM GOAL #3   Title increase strength of the right knee to 4+/5   Time 8   Period Weeks   Status New   PT LONG TERM GOAL #4   Title tolerate standing and walking to be able to grocery shop   Time 8   Period Weeks   Status New   PT LONG TERM GOAL #5   Title walk around building outside two laps without pain > 3/10               Plan - 07/09/15 1137    Clinical Impression Statement Patient with right knee scope on 06/10/15, she reports that she has been in worse pain over the past 10 days.  Has AROM of 5-116 degrees flexion, biggest issue for her is pain, has very poor VMO.   Pt will benefit from skilled therapeutic intervention in order to improve on the following deficits Abnormal gait;Decreased range of motion;Difficulty walking;Decreased strength;Increased edema;Pain   Rehab Potential Good   PT Frequency 2x / week   PT Duration 8 weeks   PT Treatment/Interventions Electrical Stimulation;Cryotherapy;Gait training;Ultrasound;Iontophoresis /ml Dexamethasone;Functional mobility training;Therapeutic activities;Therapeutic exercise;Balance training;Manual techniques;Patient/family education   PT Next Visit Plan slowly add exercises, focus on VMO   Consulted and Agree with Plan of Care Patient          G-Codes - 2015-07-09 1141    Functional Assessment Tool Used Foto   Functional Limitation Mobility: Walking and moving around   Mobility: Walking and Moving Around Current Status [phone]) At least 60 percent but  less than 80 percent impaired, limited or restricted   Mobility: Walking and Moving Around Goal Status (U0454) At least 40 percent but less than 60 percent impaired, limited or  restricted       Problem List Patient Active Problem List   Diagnosis Date Noted  . OSA (obstructive sleep apnea) 04/13/2014  . Allergic rhinitis 09/29/2012  . Situational stress 09/29/2012  . Hyperlipidemia 06/16/2011  . Diabetes mellitus 06/16/2011  . Anxiety and depression 06/16/2011    Jearld Lesch., PT 06/28/2015, 11:42 AM  [facility] [address] [place], [place], [address] Phone: [phone]   Fax:  [phone]

## 2015-06-28 NOTE — Patient Instructions (Signed)
Quad Set   Slowly tighten thigh muscles of straight leg hold 2 seconds. Relax. Repeat _15___ times. Do __3__ sessions per day.  Short Arc Quad   Place a large can or rolled towel under leg. Straighten knee and leg. Hold _3___ seconds. Repeat with other leg. Repeat _15_ times. Do _3___ sessions per day.  PROM: Gastroc Stretch   Stand with right foot back, leg straight, forward leg bent. Keeping heel on floor, turned slightly out, lean into wall until stretch is felt in calf. Hold _30___ seconds. Repeat _3___ times per set. Do __2__ sets per session. Do __2__ sessions per day.   Knee Flexion   With towel around left heel, gently pull knee up with towel until stretch is felt. Hold _10__ seconds.  Repeat _10___ times per set. Do __2__ sets per session. Do __3__ sessions per day.  

## 2015-07-01 ENCOUNTER — Encounter: Payer: Self-pay | Admitting: Physical Therapy

## 2015-07-01 ENCOUNTER — Ambulatory Visit: Payer: Medicare Other | Admitting: Physical Therapy

## 2015-07-01 ENCOUNTER — Ambulatory Visit: Payer: Medicare Other | Attending: Orthopaedic Surgery | Admitting: Physical Therapy

## 2015-07-01 DIAGNOSIS — R262 Difficulty in walking, not elsewhere classified: Secondary | ICD-10-CM

## 2015-07-01 DIAGNOSIS — M25561 Pain in right knee: Secondary | ICD-10-CM

## 2015-07-01 NOTE — Therapy (Signed)
[facility] [address] [place], [place], [address] Phone: [phone]   Fax:  [phone]  Physical Therapy Treatment  Patient Details  Name: Diana George MRN: [id number] Date of Birth: [birth year] Referring Provider:  Marcene Corning, MD  Encounter Date: 07/01/2015      PT End of Session - 07/01/15 1146    Visit Number 2   Date for PT Re-Evaluation 08/28/15   PT Start Time 1103   PT Stop Time 1157   PT Time Calculation (min) 54 min   Activity Tolerance Patient limited by pain      Past Medical History  Diagnosis Date  . Anxiety   . Depression   . Diabetes mellitus   . Hyperlipidemia   . History of coronary rotational ablation 2006  . Renal stone   . Sleep apnea     uses cpap    Past Surgical History  Procedure Laterality Date  . Tubal ligation  1977  . Cataract extraction w/ intraocular lens  implant, bilateral  2010  . Leg surgery Left 2011    fracture with hardware  . Stone extraction with basket  2016, [id number]  . Heart ablation  2010    There were no vitals filed for this visit.  Visit Diagnosis:  Right knee pain  Difficulty walking      Subjective Assessment - 07/01/15 1105    Subjective Pt report that things are going great because she is having less pain   Patient Stated Goals less pain   Currently in Pain? No/denies   Pain Score 0-No pain   Pain Location Knee   Pain Orientation Right   Pain Descriptors / Indicators Aching   Pain Type Surgical pain   Pain Onset 1 to 4 weeks ago                         [facility] Adult PT Treatment/Exercise - 07/01/15 0001    Exercises   Exercises Knee/Hip   Knee/Hip Exercises: Aerobic   Nustep Level 4 6 minutes   Knee/Hip Exercises: Standing   Forward Step Up 2 sets;Right;10 reps;Step Height: 2"  HHA    Other Standing Knee Exercises TKE RLE blue Tband 2 sets 20 reps    Knee/Hip Exercises: Seated   Abduction/Adduction  Both;1  set;20 reps  blue Tband    Sit to Sand 10 reps;without UE support   Knee/Hip Exercises: Supine   Quad Sets 2 sets;10 reps;Right  3 sec hold   Short Arc Quad Sets 2 sets;Right;10 reps   Heel Slides AROM;Strengthening;Right;2 sets;15 reps   Straight Leg Raises 2 sets;10 reps   Straight Leg Raise with External Rotation 10 reps;Right;Strengthening;AROM   Modalities   Modalities Cryotherapy;Electrical Stimulation   Cryotherapy   Number Minutes Cryotherapy 15 Minutes   Cryotherapy Location Knee   Type of Cryotherapy Ice pack   Electrical Stimulation   Electrical Stimulation Location right knee   Electrical Stimulation Action IFC   Electrical Stimulation Parameters in elevation   Electrical Stimulation Goals Pain                  PT Short Term Goals - 07/01/15 1149    PT SHORT TERM GOAL #1   Title independent with initial HEP   Status Partially Met           PT Long Term Goals - 07/01/15 1149    PT LONG TERM GOAL #1  Title report pain decreased 50%   Status Partially Met               Plan - 07/01/15 1147    Clinical Impression Statement Pt reports that things are going better. Pt able to complete additional therapeutic interventions this date. Pt VMO contraction has improved from last treatment. Pt able to perform SAQ and TKE with good control but  still has some weakness.   Pt will benefit from skilled therapeutic intervention in order to improve on the following deficits Abnormal gait;Decreased range of motion;Difficulty walking;Decreased strength;Increased edema;Pain   Rehab Potential Good   PT Frequency 2x / week   PT Duration 8 weeks        Problem List Patient Active Problem List   Diagnosis Date Noted  . OSA (obstructive sleep apnea) 04/13/2014  . Allergic rhinitis 09/29/2012  . Situational stress 09/29/2012  . Hyperlipidemia 06/16/2011  . Diabetes mellitus 06/16/2011  . Anxiety and depression 06/16/2011    Scot Jun,  PTA 07/01/2015, 11:51 AM  [place], [place], [address] Phone: [phone]   Fax:  [phone]

## 2015-07-06 ENCOUNTER — Encounter: Payer: Self-pay | Admitting: Physical Therapy

## 2015-07-06 ENCOUNTER — Ambulatory Visit: Payer: Medicare Other | Admitting: Physical Therapy

## 2015-07-06 DIAGNOSIS — M25561 Pain in right knee: Secondary | ICD-10-CM | POA: Diagnosis not present

## 2015-07-06 DIAGNOSIS — R262 Difficulty in walking, not elsewhere classified: Secondary | ICD-10-CM

## 2015-07-06 NOTE — Therapy (Signed)
[place], [place], [address] Phone: [phone]   Fax:  [phone]  Physical Therapy Treatment  Patient Details  Name: Diana George MRN: [id number] Date of Birth: 1946/07/16 Referring Provider:  Melrose Nakayama, MD  Encounter Date: 07/06/2015      PT End of Session - 07/06/15 1043    Visit Number 3   PT Start Time 1011   PT Stop Time 1115   PT Time Calculation (min) 64 min      Past Medical History  Diagnosis Date  . Anxiety   . Depression   . Diabetes mellitus   . Hyperlipidemia   . History of coronary rotational ablation 2006  . Renal stone   . Sleep apnea     uses cpap    Past Surgical History  Procedure Laterality Date  . Tubal ligation  1977  . Cataract extraction w/ intraocular lens  implant, bilateral  2010  . Leg surgery Left 2011    fracture with hardware  . Stone extraction with basket  2016, [id number]  . Heart ablation  2010    There were no vitals filed for this visit.  Visit Diagnosis:  Right knee pain  Difficulty walking      Subjective Assessment - 07/06/15 1017    Subjective feeling so much better   Currently in Pain? Yes   Pain Score 1    Pain Location Knee   Pain Orientation Right                         [facility] Adult PT Treatment/Exercise - 07/06/15 0001    Knee/Hip Exercises: Aerobic   Stationary Bike 6 min   Nustep L 6 35min   Knee/Hip Exercises: Machines for Strengthening   Cybex Knee Extension 5# 10 times, 10# 10 times   Cybex Knee Flexion 20# 2 sets 10   Cybex Leg Press 20 # 3 sets 10  30# increased LBP   Total Gym Leg Press --  ball btwn knees, ER, toes up   Knee/Hip Exercises: Standing   Lateral Step Up Both;2 sets;10 reps;Hand Hold: 2;Step Height: 4"  opp leg abd   Other Standing Knee Exercises hip 4 way with pulleys 10# 10 times each   Modalities   Modalities Cryotherapy;Electrical Stimulation   Cryotherapy   Number  Minutes Cryotherapy 15 Minutes   Cryotherapy Location Knee   Type of Cryotherapy Ice pack   Electrical Stimulation   Electrical Stimulation Location right knee   Electrical Stimulation Action IFC   Electrical Stimulation Goals Pain                  PT Short Term Goals - 07/01/15 1149    PT SHORT TERM GOAL #1   Title independent with initial HEP   Status Partially Met           PT Long Term Goals - 07/01/15 1149    PT LONG TERM GOAL #1   Title report pain decreased 50%   Status Partially Met               Plan - 07/06/15 1044    Clinical Impression Statement pt tolerated increased ther ex with weights well but did have mild increased pain and weakness esp in VMO and abd   PT Next Visit Plan VMO and hip abd. Check ROM/strength and goals        Problem List  Patient Active Problem List   Diagnosis Date Noted  . OSA (obstructive sleep apnea) 04/13/2014  . Allergic rhinitis 09/29/2012  . Situational stress 09/29/2012  . Hyperlipidemia 06/16/2011  . Diabetes mellitus 06/16/2011  . Anxiety and depression 06/16/2011    PAYSEUR,ANGIE PTA 07/06/2015, 10:51 AM  [place] [address] [place], [place], [address] Phone: [phone]   Fax:  [phone]

## 2015-07-07 ENCOUNTER — Telehealth: Payer: Self-pay | Admitting: Internal Medicine

## 2015-07-07 NOTE — Telephone Encounter (Signed)
[facility] calling to advise that patient has been scheduled to be seen by Dr. Briant Cedar.    Appointment for 07/12/15 @ 10:45.  Patient is aware of appointment.

## 2015-07-09 ENCOUNTER — Encounter: Payer: Self-pay | Admitting: Physical Therapy

## 2015-07-09 ENCOUNTER — Ambulatory Visit: Payer: Medicare Other | Admitting: Physical Therapy

## 2015-07-09 DIAGNOSIS — M25561 Pain in right knee: Secondary | ICD-10-CM

## 2015-07-09 NOTE — Therapy (Signed)
[facility] [address] [place], [place], [address] Phone: [phone]   Fax:  [phone]  Physical Therapy Treatment  Patient Details  Name: Diana George MRN: [id number] Date of Birth: Mar 27, 1946 Referring Provider:  Marcene Corning, MD  Encounter Date: 07/09/2015      PT End of Session - 07/09/15 1031    Visit Number 4   Date for PT Re-Evaluation 08/28/15   PT Start Time 1010   PT Stop Time 1050   PT Time Calculation (min) 40 min      Past Medical History  Diagnosis Date  . Anxiety   . Depression   . Diabetes mellitus   . Hyperlipidemia   . History of coronary rotational ablation 2006  . Renal stone   . Sleep apnea     uses cpap    Past Surgical History  Procedure Laterality Date  . Tubal ligation  1977  . Cataract extraction w/ intraocular lens  implant, bilateral  2010  . Leg surgery Left 2011    fracture with hardware  . Stone extraction with basket  2016, [id number]  . Heart ablation  2010    There were no vitals filed for this visit.  Visit Diagnosis:  Right knee pain      Subjective Assessment - 07/09/15 1016    Subjective overdid last session , plus moving so hurting   Currently in Pain? Yes   Pain Score 6    Pain Orientation Medial;Right            [facility] PT Assessment - 07/09/15 0001    AROM   Overall AROM Comments RT kne AROM 0-130                     [facility] Adult PT Treatment/Exercise - 07/09/15 0001    Knee/Hip Exercises: Aerobic   Stationary Bike 6 min   Nustep L 4 6 min   Modalities   Modalities Cryotherapy   Cryotherapy   Number Minutes Cryotherapy 15 Minutes   Cryotherapy Location Knee   Type of Cryotherapy Ice pack   Electrical Stimulation   Electrical Stimulation Location right knee   Electrical Stimulation Action IFC   Electrical Stimulation Goals Pain                  PT Short Term Goals - 07/09/15 1030    PT SHORT TERM GOAL #1   Title independent with initial HEP   Status Achieved           PT Long Term Goals - 07/09/15 1031    PT LONG TERM GOAL #1   Title report pain decreased 50%   PT LONG TERM GOAL #2   Title increase AROM of the right knee to WNL's   Status Achieved   PT LONG TERM GOAL #3   Title increase strength of the right knee to 4+/5   Status On-going   PT LONG TERM GOAL #4   Title tolerate standing and walking to be able to grocery shop   Status Achieved   PT LONG TERM GOAL #5   Title walk around building outside two laps without pain > 3/10   Status On-going               Plan - 07/09/15 1031    Clinical Impression Statement pt with limited tolerance to ther ex today d/t fatigue, requested decreased activity. Excellent ROM, strength imporving but fatigues easily  PT Next Visit Plan VMO and hip abd.         Problem List Patient Active Problem List   Diagnosis Date Noted  . OSA (obstructive sleep apnea) 04/13/2014  . Allergic rhinitis 09/29/2012  . Situational stress 09/29/2012  . Hyperlipidemia 06/16/2011  . Diabetes mellitus 06/16/2011  . Anxiety and depression 06/16/2011    Bevan Disney,ANGIE PTA 07/09/2015, 10:33 AM  [facility] [address] [place], [place], [address] Phone: [phone]   Fax:  [phone]

## 2015-07-13 ENCOUNTER — Encounter: Payer: Self-pay | Admitting: Physical Therapy

## 2015-07-13 ENCOUNTER — Ambulatory Visit: Payer: Medicare Other | Admitting: Physical Therapy

## 2015-07-13 ENCOUNTER — Other Ambulatory Visit: Payer: Self-pay | Admitting: Nephrology

## 2015-07-13 DIAGNOSIS — M25561 Pain in right knee: Secondary | ICD-10-CM

## 2015-07-13 DIAGNOSIS — N183 Chronic kidney disease, stage 3 unspecified: Secondary | ICD-10-CM

## 2015-07-13 DIAGNOSIS — R262 Difficulty in walking, not elsewhere classified: Secondary | ICD-10-CM

## 2015-07-13 NOTE — Therapy (Signed)
[place], [place], [address] Phone: [phone]   Fax:  [phone]  Physical Therapy Treatment  Patient Details  Name: Diana George MRN: [id number] Date of Birth: [birth year] Referring Provider:  Melrose Nakayama, MD  Encounter Date: 07/13/2015      PT End of Session - 07/13/15 1052    PT Start Time 1010   PT Stop Time 1110   PT Time Calculation (min) 60 min      Past Medical History  Diagnosis Date  . Anxiety   . Depression   . Diabetes mellitus   . Hyperlipidemia   . History of coronary rotational ablation 2006  . Renal stone   . Sleep apnea     uses cpap    Past Surgical History  Procedure Laterality Date  . Tubal ligation  1977  . Cataract extraction w/ intraocular lens  implant, bilateral  2010  . Leg surgery Left 2011    fracture with hardware  . Stone extraction with basket  2016, [id number]  . Heart ablation  2010    There were no vitals filed for this visit.  Visit Diagnosis:  Right knee pain  Difficulty walking      Subjective Assessment - 07/13/15 1013    Subjective rested this weekend and feel so much better, walked down steps alternating this morning   Currently in Pain? Yes   Pain Score 1             [facility] PT Assessment - 07/13/15 0001    AROM   Overall AROM Comments 0-130                     [facility] Adult PT Treatment/Exercise - 07/13/15 0001    Knee/Hip Exercises: Aerobic   Stationary Bike 63mn   Nustep L 4 6 min   Knee/Hip Exercises: Machines for Strengthening   Cybex Knee Extension 10# 2 sets 10   Cybex Knee Flexion 20# 2 sets 10   Total Gym Leg Press 30# 10 reps each  ball btwn knees, ER, toes up   Other Machine 30# heel raises on leg press 2 sets 10   Knee/Hip Exercises: Standing   Forward Step Up Right;2 sets;10 reps;Hand Hold: 2  up/over/revers   Other Standing Knee Exercises hip 4 way with pulleys 10# 15times each   Knee/Hip Exercises: Seated   Sit to Sand 15 reps;without UE support  weighted ball   Cryotherapy   Number Minutes Cryotherapy 15 Minutes   Cryotherapy Location Knee   Type of Cryotherapy Ice pack   Electrical Stimulation   Electrical Stimulation Location right knee   Electrical Stimulation Action IFC   Electrical Stimulation Goals Pain                  PT Short Term Goals - 07/09/15 1030    PT SHORT TERM GOAL #1   Title independent with initial HEP   Status Achieved           PT Long Term Goals - 07/13/15 1039    PT LONG TERM GOAL #1   Title report pain decreased 50%   Status Achieved   PT LONG TERM GOAL #2   Title increase AROM of the right knee to WNL's   Status Achieved   PT LONG TERM GOAL #3   Title increase strength of the right knee to 4+/5   Status Achieved   PT LONG TERM  GOAL #4   Title tolerate standing and walking to be able to grocery shop   Status Achieved   PT Twilight #5   Title walk around building outside two laps without pain > 3/10   Status Partially Met               Plan - 07/13/15 1040    Clinical Impression Statement pt doing very well after set back last week from overdoing at home, great goal progress. seeing MD friday- should be close to D/C   PT Next Visit Plan walk outside, write MD note        Problem List Patient Active Problem List   Diagnosis Date Noted  . OSA (obstructive sleep apnea) 04/13/2014  . Allergic rhinitis 09/29/2012  . Situational stress 09/29/2012  . Hyperlipidemia 06/16/2011  . Diabetes mellitus 06/16/2011  . Anxiety and depression 06/16/2011    PAYSEUR,ANGIE PTA 07/13/2015, 10:53 AM  [place] [address] [place], [place], [address] Phone: [phone]   Fax:  [phone]

## 2015-07-15 ENCOUNTER — Ambulatory Visit: Payer: Medicare Other | Admitting: Physical Therapy

## 2015-07-15 ENCOUNTER — Encounter: Payer: Self-pay | Admitting: Physical Therapy

## 2015-07-15 DIAGNOSIS — R262 Difficulty in walking, not elsewhere classified: Secondary | ICD-10-CM

## 2015-07-15 DIAGNOSIS — M25561 Pain in right knee: Secondary | ICD-10-CM | POA: Diagnosis not present

## 2015-07-15 NOTE — Therapy (Signed)
[place], [place], [address] Phone: [phone]   Fax:  [phone]  Physical Therapy Treatment  Patient Details  Name: Diana George MRN: [id number] Date of Birth: Jan 29, 1946 Referring Provider:  Melrose Nakayama, MD  Encounter Date: 07/15/2015      PT End of Session - 07/15/15 1434    PT Start Time 1350   PT Stop Time 1445   PT Time Calculation (min) 55 min      Past Medical History  Diagnosis Date  . Anxiety   . Depression   . Diabetes mellitus   . Hyperlipidemia   . History of coronary rotational ablation 2006  . Renal stone   . Sleep apnea     uses cpap    Past Surgical History  Procedure Laterality Date  . Tubal ligation  1977  . Cataract extraction w/ intraocular lens  implant, bilateral  2010  . Leg surgery Left 2011    fracture with hardware  . Stone extraction with basket  2016, [id number]  . Heart ablation  2010    There were no vitals filed for this visit.  Visit Diagnosis:  Difficulty walking      Subjective Assessment - 07/15/15 1358    Subjective doing great   Currently in Pain? No/denies                         [facility] Adult PT Treatment/Exercise - 07/15/15 0001    Ambulation/Gait   Gait Comments amb outside no AD no deficiets  1000 feet neg barriers   Knee/Hip Exercises: Aerobic   Stationary Bike 8 min   Nustep L 5 8 min   Knee/Hip Exercises: Machines for Strengthening   Cybex Knee Extension 10# 3 sets 10   Cybex Knee Flexion 20# 3 sets 10   Total Gym Leg Press 30# 10 reps each  ball btwn knees, ER, toes up   Other Machine 30# heel raises on leg press 2 sets 10   Knee/Hip Exercises: Standing   Lateral Step Up Both;2 sets;10 reps;Hand Hold: 2;Step Height: 4"  opp leg abd   Forward Step Up Right;2 sets;10 reps;Hand Hold: 2  up/over/revers   Cryotherapy   Number Minutes Cryotherapy 15 Minutes   Cryotherapy Location Knee   Type of  Cryotherapy Ice pack   Electrical Stimulation   Electrical Stimulation Location right knee   Electrical Stimulation Action IFC   Electrical Stimulation Goals Pain                  PT Short Term Goals - 07/09/15 1030    PT SHORT TERM GOAL #1   Title independent with initial HEP   Status Achieved           PT Long Term Goals - 07/15/15 1404    PT LONG TERM GOAL #1   Title report pain decreased 50%   Status Achieved   PT LONG TERM GOAL #2   Title increase AROM of the right knee to WNL's   Baseline 0-130   Status Achieved   PT LONG TERM GOAL #3   Title increase strength of the right knee to 4+/5   Status Achieved   PT LONG TERM GOAL #4   Title tolerate standing and walking to be able to grocery shop   Status Achieved   PT LONG TERM GOAL #5   Title walk around building outside two laps without  pain > 3/10               Plan - 07/15/15 1410    Clinical Impression Statement Goals met   PT Next Visit Plan D/C        Problem List Patient Active Problem List   Diagnosis Date Noted  . OSA (obstructive sleep apnea) 04/13/2014  . Allergic rhinitis 09/29/2012  . Situational stress 09/29/2012  . Hyperlipidemia 06/16/2011  . Diabetes mellitus 06/16/2011  . Anxiety and depression 06/16/2011   PHYSICAL THERAPY DISCHARGE SUMMARY   Plan: Patient agrees to discharge.  Patient goals were met. Patient is being discharged due to meeting the stated rehab goals.  ?????      PAYSEUR,ANGIE PTA 07/15/2015, 2:35 PM  [place] [address] [place], [place], [address] Phone: [phone]   Fax:  [phone]

## 2015-07-16 ENCOUNTER — Ambulatory Visit
Admission: RE | Admit: 2015-07-16 | Discharge: 2015-07-16 | Disposition: A | Discharge status: Home / Self Care | Payer: Medicare Other | Source: Ambulatory Visit | Attending: Nephrology | Admitting: Nephrology

## 2015-07-16 DIAGNOSIS — N183 Chronic kidney disease, stage 3 unspecified: Secondary | ICD-10-CM

## 2015-08-03 ENCOUNTER — Other Ambulatory Visit: Payer: Medicare Other | Admitting: Internal Medicine

## 2015-08-03 DIAGNOSIS — E785 Hyperlipidemia, unspecified: Secondary | ICD-10-CM

## 2015-08-03 DIAGNOSIS — Z79899 Other long term (current) drug therapy: Secondary | ICD-10-CM

## 2015-08-03 DIAGNOSIS — E119 Type 2 diabetes mellitus without complications: Secondary | ICD-10-CM

## 2015-08-03 LAB — HEMOGLOBIN A1C
Hgb A1c MFr Bld: 5.9 % — ABNORMAL HIGH (ref <5.7)
MEAN PLASMA GLUCOSE: 123 mg/dL — AB (ref <117)

## 2015-08-03 LAB — HEPATIC FUNCTION PANEL
ALBUMIN: 4 g/dL (ref 3.6–5.1)
ALT: 9 U/L (ref 6–29)
AST: 11 U/L (ref 10–35)
Alkaline Phosphatase: 58 U/L (ref 33–130)
Bilirubin, Direct: 0.1 mg/dL (ref <=0.2)
Indirect Bilirubin: 0.4 mg/dL (ref 0.2–1.2)
TOTAL PROTEIN: 6.2 g/dL (ref 6.1–8.1)
Total Bilirubin: 0.5 mg/dL (ref 0.2–1.2)

## 2015-08-03 LAB — LIPID PANEL
Cholesterol: 161 mg/dL (ref 125–200)
HDL: 59 mg/dL (ref >=46)
LDL CALC: 91 mg/dL (ref <130)
TRIGLYCERIDES: 54 mg/dL (ref <150)
Total CHOL/HDL Ratio: 2.7 Ratio (ref <=5.0)
VLDL: 11 mg/dL (ref <30)

## 2015-08-06 ENCOUNTER — Encounter: Payer: Self-pay | Admitting: Internal Medicine

## 2015-08-06 ENCOUNTER — Ambulatory Visit (INDEPENDENT_AMBULATORY_CARE_PROVIDER_SITE_OTHER): Payer: Medicare Other | Admitting: Internal Medicine

## 2015-08-06 VITALS — BP 120/74 | HR 88 | Temp 98.0°F | Wt 189.0 lb

## 2015-08-06 DIAGNOSIS — Z23 Encounter for immunization: Secondary | ICD-10-CM

## 2015-08-06 DIAGNOSIS — E088 Diabetes mellitus due to underlying condition with unspecified complications: Secondary | ICD-10-CM | POA: Diagnosis not present

## 2015-08-06 DIAGNOSIS — E785 Hyperlipidemia, unspecified: Secondary | ICD-10-CM | POA: Diagnosis not present

## 2015-08-06 NOTE — Progress Notes (Signed)
   Subjective:    Patient ID: Diana George, female    DOB: 09-Sep-1946, 69 y.o.   MRN: [id number]  HPI 2 months ago had right knee surgery by Dr. Yisroel Ramming. Had torn meniscus. Accuchecks are excellent.  BP OK. Flu vaccine given. Saw Nephrologist about kidney functions. Being monitored for CKD stage III and stable. History of essential hypertension, sleep apnea, controlled try 2 diabetes mellitus, hyperlipidemia, depression. Last physical exam March 2016.    Review of Systems     Objective:   Physical Exam Skin warm and dry. Nodes none. Neck is supple without JVD thyromegaly or carotid bruits. Chest clear to auscultation. Cardiac exam regular rate and rhythm normal S1 and S2. Extremities without edema.       Assessment & Plan:  Essential hypertension-stable  Chronic kidney disease-stable followed by nephrology  Controlled type 2 diabetes mellitus-hemoglobin A1c 5.9%  Hyperlipidemia-lipid panel liver functions normal  History of depression-stable  History of sleep apnea  Plan: Continue to encourage diet exercise and weight control. Hemoglobin A1c 5.9% much better than 6.3% previously. Lipid panel liver functions within normal limits. Physical exam appointment April 2017 made today.

## 2015-08-12 IMAGING — CR DG ABDOMEN 1V
2 series · 2 of 2 positions shown · non-contrast
Comparison: CT of the abdomen and pelvis 11/17/2014.

CLINICAL DATA: Preoperative evaluation for right-sided kidney
stone. Right-sided abdominal pain.

EXAM:
ABDOMEN - 1 VIEW

[t abdomen supine (1 of 2)]
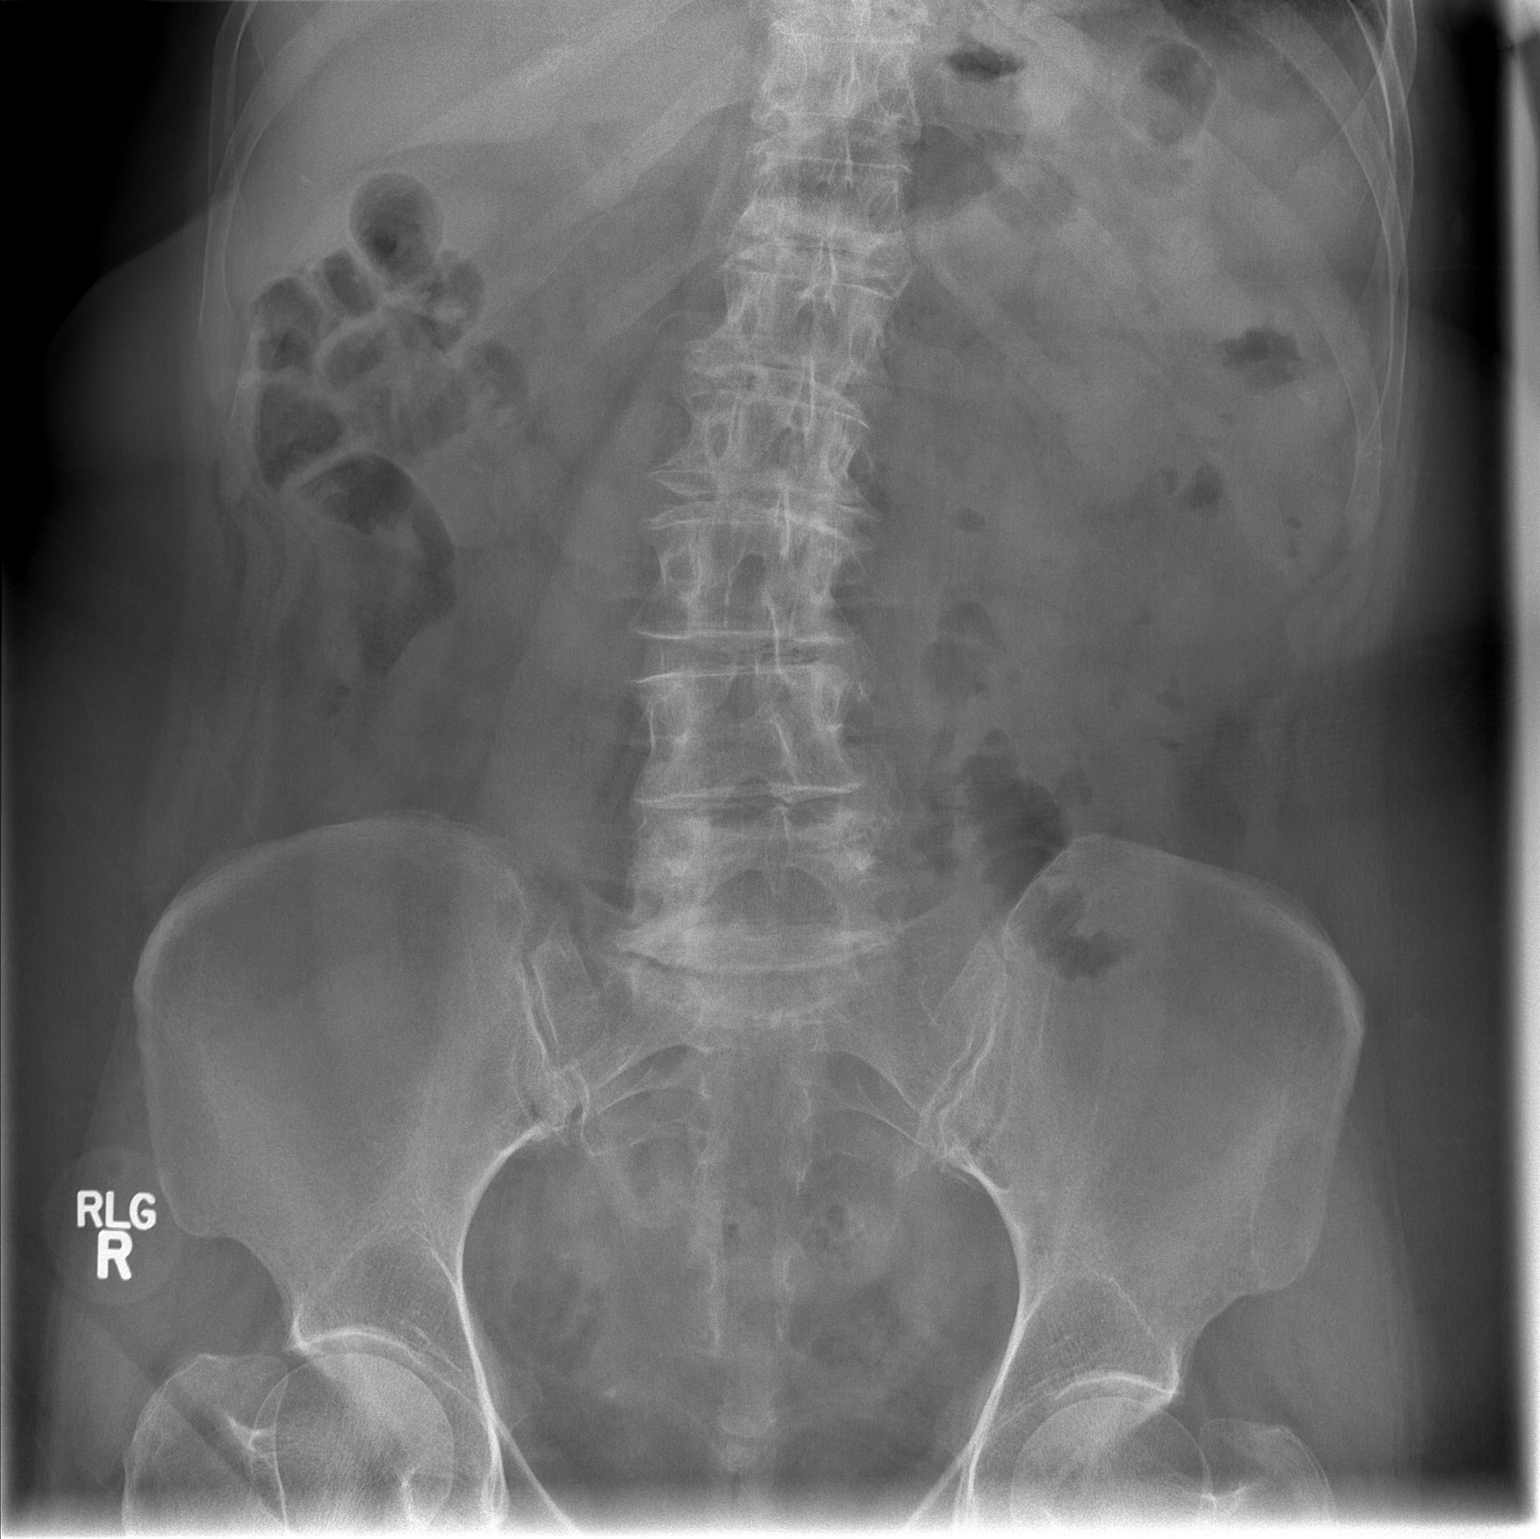

[t abdomen supine (2 of 2)]
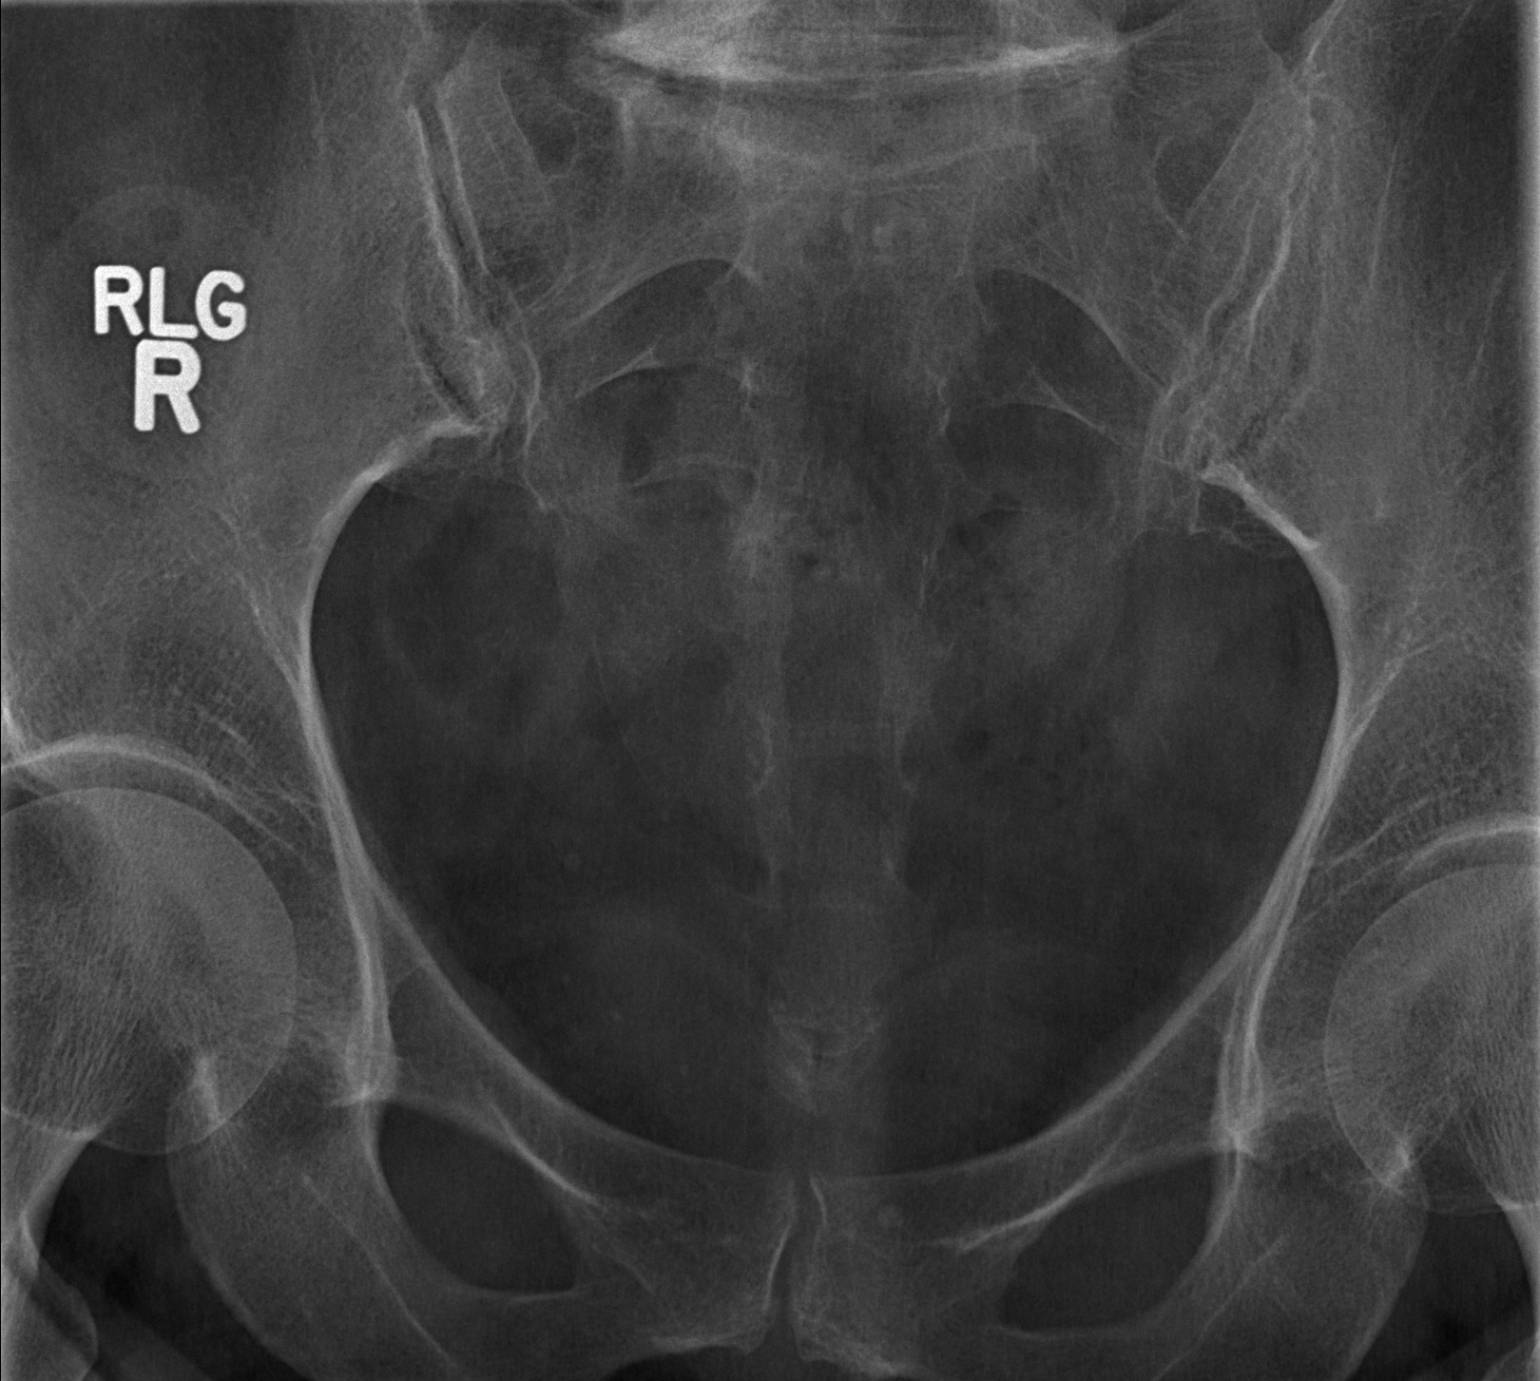

[2 of 2 positions shown; findings below may reference images not displayed]

FINDINGS: The previously noted calculus in the right renal pelvis from CT scan
11/17/2014 is not confidently identified ovary the right renal
collecting system, the expected course of the right ureter or the
urinary bladder on today's examination. No new urinary tract stones
are identified. Gas and stool are seen scattered throughout the
colon extending to the level of the distal rectum. No pathologic
distension of small bowel is noted. No gross evidence of
pneumoperitoneum.
IMPRESSION: 1. Previously noted right renal pelvis calculus is not confidently
identified on today's plain film examination. This stone has likely
passed, however, this could be further evaluated with noncontrast CT
of the abdomen and pelvis if there is clinical concern for
persistent calculus.
2. Nonobstructive bowel gas pattern.

## 2015-08-27 ENCOUNTER — Other Ambulatory Visit: Payer: Self-pay | Admitting: Internal Medicine

## 2015-08-27 ENCOUNTER — Other Ambulatory Visit: Payer: Self-pay

## 2015-08-27 NOTE — Telephone Encounter (Signed)
erro  neous encounter

## 2015-08-28 NOTE — Patient Instructions (Signed)
Congratulation on improvement hemoglobin A1c. Continue diet exercise and weight loss efforts. Return in April 2017. It was a pleasure to see you today. Flu vaccine given.

## 2015-12-06 ENCOUNTER — Telehealth: Payer: Self-pay | Admitting: Internal Medicine

## 2015-12-06 MED ORDER — BUPROPION HCL ER (XL) 150 MG PO TB24: 150.0000 mg | ORAL_TABLET | Freq: Every day | ORAL | Status: DC

## 2015-12-06 MED ORDER — CLORAZEPATE DIPOTASSIUM 3.75 MG PO TABS: 3.7500 mg | ORAL_TABLET | Freq: Two times a day (BID) | ORAL | Status: DC

## 2015-12-06 MED ORDER — SERTRALINE HCL 100 MG PO TABS: 100.0000 mg | ORAL_TABLET | Freq: Every day | ORAL | Status: DC

## 2015-12-06 MED ORDER — METFORMIN HCL 500 MG PO TABS: ORAL_TABLET | ORAL | Status: DC

## 2015-12-06 NOTE — Telephone Encounter (Signed)
Updating her address, phone # and also her mail order pharmacy info:  They have merged with [facility] [phone] #[phone]  **She needs refills on the following medications:    Metformin Buproprion Clorazepate Zoloft  She has appointment in April for her check-up!

## 2015-12-06 NOTE — Telephone Encounter (Signed)
Please refill these for 6 months 

## 2015-12-06 NOTE — Telephone Encounter (Signed)
All e-scribed to new pharmacy

## 2015-12-09 ENCOUNTER — Other Ambulatory Visit: Payer: Self-pay

## 2015-12-09 MED ORDER — CLORAZEPATE DIPOTASSIUM 3.75 MG PO TABS: 3.7500 mg | ORAL_TABLET | Freq: Two times a day (BID) | ORAL | Status: DC

## 2015-12-13 ENCOUNTER — Ambulatory Visit: Payer: Medicare Other | Admitting: Pulmonary Disease

## 2016-02-01 ENCOUNTER — Other Ambulatory Visit: Payer: Medicare Other | Admitting: Internal Medicine

## 2016-02-01 DIAGNOSIS — Z130 Encounter for screening for diseases of the blood and blood-forming organs and certain disorders involving the immune mechanism: Secondary | ICD-10-CM

## 2016-02-01 DIAGNOSIS — Z79899 Other long term (current) drug therapy: Secondary | ICD-10-CM | POA: Diagnosis not present

## 2016-02-01 DIAGNOSIS — Z0000 Encounter for general adult medical examination without abnormal findings: Secondary | ICD-10-CM

## 2016-02-01 DIAGNOSIS — E119 Type 2 diabetes mellitus without complications: Secondary | ICD-10-CM | POA: Diagnosis not present

## 2016-02-01 DIAGNOSIS — E785 Hyperlipidemia, unspecified: Secondary | ICD-10-CM | POA: Diagnosis not present

## 2016-02-01 DIAGNOSIS — E559 Vitamin D deficiency, unspecified: Secondary | ICD-10-CM | POA: Diagnosis not present

## 2016-02-01 DIAGNOSIS — E039 Hypothyroidism, unspecified: Secondary | ICD-10-CM

## 2016-02-01 LAB — CBC WITH DIFFERENTIAL/PLATELET
BASOS PCT: 1 %
Basophils Absolute: 60 cells/uL (ref 0–200)
EOS ABS: 180 {cells}/uL (ref 15–500)
Eosinophils Relative: 3 %
HEMATOCRIT: 40.3 % (ref 35.0–45.0)
HEMOGLOBIN: 13 g/dL (ref 11.7–15.5)
Lymphocytes Relative: 30 %
Lymphs Abs: 1800 cells/uL (ref 850–3900)
MCH: 30.2 pg (ref 27.0–33.0)
MCHC: 32.3 g/dL (ref 32.0–36.0)
MCV: 93.5 fL (ref 80.0–100.0)
MONO ABS: 420 {cells}/uL (ref 200–950)
MPV: 10.5 fL (ref 7.5–12.5)
Monocytes Relative: 7 %
NEUTROS ABS: 3540 {cells}/uL (ref 1500–7800)
Neutrophils Relative %: 59 %
Platelets: 326 10*3/uL (ref 140–400)
RBC: 4.31 MIL/uL (ref 3.80–5.10)
RDW: 13.2 % (ref 11.0–15.0)
WBC: 6 10*3/uL (ref 3.8–10.8)

## 2016-02-01 LAB — COMPLETE METABOLIC PANEL WITH GFR
ALBUMIN: 4.1 g/dL (ref 3.6–5.1)
ALK PHOS: 63 U/L (ref 33–130)
ALT: 11 U/L (ref 6–29)
AST: 14 U/L (ref 10–35)
BILIRUBIN TOTAL: 0.8 mg/dL (ref 0.2–1.2)
BUN: 18 mg/dL (ref 7–25)
CALCIUM: 8.8 mg/dL (ref 8.6–10.4)
CO2: 25 mmol/L (ref 20–31)
CREATININE: 0.99 mg/dL (ref 0.50–0.99)
Chloride: 107 mmol/L (ref 98–110)
GFR, EST AFRICAN AMERICAN: 67 mL/min (ref >=60)
GFR, Est Non African American: 58 mL/min — ABNORMAL LOW (ref >=60)
Glucose, Bld: 116 mg/dL — ABNORMAL HIGH (ref 65–99)
Potassium: 4.7 mmol/L (ref 3.5–5.3)
Sodium: 141 mmol/L (ref 135–146)
TOTAL PROTEIN: 6.2 g/dL (ref 6.1–8.1)

## 2016-02-01 LAB — LIPID PANEL
CHOL/HDL RATIO: 2.9 ratio (ref <=5.0)
Cholesterol: 153 mg/dL (ref 125–200)
HDL: 53 mg/dL (ref >=46)
LDL Cholesterol: 81 mg/dL (ref <130)
Triglycerides: 95 mg/dL (ref <150)
VLDL: 19 mg/dL (ref <30)

## 2016-02-01 LAB — HEMOGLOBIN A1C
HEMOGLOBIN A1C: 6.2 % — AB (ref <5.7)
MEAN PLASMA GLUCOSE: 131 mg/dL

## 2016-02-01 LAB — TSH: TSH: 2.26 m[IU]/L

## 2016-02-02 LAB — MICROALBUMIN, URINE: Microalb, Ur: 1.5 mg/dL

## 2016-02-02 LAB — VITAMIN D 25 HYDROXY (VIT D DEFICIENCY, FRACTURES): VIT D 25 HYDROXY: 32 ng/mL (ref 30–100)

## 2016-02-03 ENCOUNTER — Ambulatory Visit (INDEPENDENT_AMBULATORY_CARE_PROVIDER_SITE_OTHER): Payer: Medicare Other | Admitting: Internal Medicine

## 2016-02-03 ENCOUNTER — Encounter: Payer: Self-pay | Admitting: Internal Medicine

## 2016-02-03 VITALS — BP 114/66 | HR 95 | Temp 97.8°F | Resp 18 | Ht 66.0 in | Wt 196.0 lb

## 2016-02-03 DIAGNOSIS — J309 Allergic rhinitis, unspecified: Secondary | ICD-10-CM | POA: Diagnosis not present

## 2016-02-03 DIAGNOSIS — E119 Type 2 diabetes mellitus without complications: Secondary | ICD-10-CM | POA: Diagnosis not present

## 2016-02-03 DIAGNOSIS — Z87442 Personal history of urinary calculi: Secondary | ICD-10-CM | POA: Diagnosis not present

## 2016-02-03 DIAGNOSIS — Z23 Encounter for immunization: Secondary | ICD-10-CM

## 2016-02-03 DIAGNOSIS — F329 Major depressive disorder, single episode, unspecified: Secondary | ICD-10-CM | POA: Diagnosis not present

## 2016-02-03 DIAGNOSIS — E785 Hyperlipidemia, unspecified: Secondary | ICD-10-CM | POA: Diagnosis not present

## 2016-02-03 DIAGNOSIS — Z0000 Encounter for general adult medical examination without abnormal findings: Secondary | ICD-10-CM

## 2016-02-03 DIAGNOSIS — F32A Depression, unspecified: Secondary | ICD-10-CM

## 2016-02-03 MED ORDER — PNEUMOCOCCAL 13-VAL CONJ VACC IM SUSP
0.5000 mL | Freq: Once | INTRAMUSCULAR | Status: AC
  Administered 2016-02-03: 0.5 mL via INTRAMUSCULAR

## 2016-02-03 NOTE — Patient Instructions (Addendum)
Continue same meds and RTC in 6 months. It was a pleasure to see you today. 

## 2016-02-09 ENCOUNTER — Encounter: Payer: Self-pay | Admitting: Internal Medicine

## 2016-02-09 DIAGNOSIS — H524 Presbyopia: Secondary | ICD-10-CM | POA: Diagnosis not present

## 2016-02-09 LAB — HM DIABETES EYE EXAM

## 2016-02-14 DIAGNOSIS — G4733 Obstructive sleep apnea (adult) (pediatric): Secondary | ICD-10-CM | POA: Diagnosis not present

## 2016-02-21 NOTE — Progress Notes (Signed)
Subjective:    Patient ID: Diana George, female    DOB: November 25, 1945, 70 y.o.   MRN: [id number]  HPI 70 year old White Female in today for health maintenance exam and evaluation of medical problems. Medical problems include hyperlipidemia, controlled type 2 diabetes mellitus, anxiety depression.  History of lithotripsy for kidney stones right renal pelvis and right lower pole of kidney in 2016.  Past medical history: Bilateral tubal ligation 1980, stone basket extraction for kidney stone in the mid 1970s, fractured left wrist November 2007 due to a fall in addition to a right anterior cruciate ligament tear or meniscal tear. Cataract extraction left eye March 2011.  Normal Cardiolite study in 2002. 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 This study was done for syncope. She had mild mitral regurgitation. Was seen by Dr. Sharrell KuGreg Taylor in 2005 for recurrent SVT. She had catheter ablation September 2005.  She had allergy evaluation 2002 with significant reaction to various tree pollens, house dust, dust mites. Bowel reaction to molds. No food allergies. Spirometry at that time was normal.  She is allergic to amoxicillin-causes a rash  Social history: She is divorced. Resides alone. She is an only child. 2 adult children. Does not smoke or consume alcohol. 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  Family history: In April 2014 her father died of septicemia with history of MI, coronary artery disease and hematoma of the brain. He was 70 years old. In August 2014 mother died with history of dementia and urinary tract infection at age 70.  Normal colonoscopy by Dr. Juanda ChanceBrodie June 2016 with 10 year follow-up recommended  Review of Systems  Constitutional: Negative.   All other systems reviewed and are negative.      Objective:   Physical Exam  Constitutional: She is oriented to person, place, and time. She appears well-developed and  well-nourished. No distress.  HENT:  Head: Normocephalic and atraumatic.  Right Ear: External ear normal.  Left Ear: External ear normal.  Mouth/Throat: Oropharynx is clear and moist. No oropharyngeal exudate.  Eyes: Conjunctivae are normal. Pupils are equal, round, and reactive to light. Right eye exhibits no discharge. Left eye exhibits no discharge. No scleral icterus.  Neck: Neck supple. No JVD present. No thyromegaly present.  Cardiovascular: Normal rate, regular rhythm, normal heart sounds and intact distal pulses.   No murmur heard. Pulmonary/Chest: Effort normal and breath sounds normal. No respiratory distress. She has no wheezes. She has no rales.  Breasts normal female  Abdominal: She exhibits no distension and no mass. There is no tenderness. There is no rebound and no guarding.  Genitourinary:  Deferred  Musculoskeletal: She exhibits no edema.  Neurological: She is alert and oriented to person, place, and time. She has normal reflexes.  Skin: Skin is warm and dry. No rash noted. She is not diaphoretic.  Psychiatric: She has a normal mood and affect. Her behavior is normal. Judgment and thought content normal.  Vitals reviewed.         Assessment & Plan:  Controlled type 2 diabetes mellitus  Hyperlipidemia  Anxiety depression  Allergic rhinitis  History of SVT status post catheter ablation  History of kidney stones  History of sleep apnea  Plan: Continue same medications and return in 6 months.         Subjective:   Patient presents for Medicare Annual/Subsequent preventive examination.  Review Past Medical/Family/Social:See above   Risk Factors  Current exercise habits: Not  as much exercise as she should Dietary issues discussed: Low fat low carbohydrate  Cardiac risk factors:Diabetes mellitus, family history, hyperlipidemia  Depression Screen  (Note: if answer to either of the following is "Yes", a more complete depression screening is  indicated)   Over the past two weeks, have you felt down, depressed or hopeless? No  Over the past two weeks, have you felt little interest or pleasure in doing things? No Have you lost interest or pleasure in daily life? No Do you often feel hopeless? No Do you cry easily over simple problems? No   Activities of Daily Living  In your present state of health, do you have any difficulty performing the following activities?:   Driving? No  Managing money? No  Feeding yourself? No  Getting from bed to chair? No  Climbing a flight of stairs? No  Preparing food and eating?: No  Bathing or showering? No  Getting dressed: No  Getting to the toilet? No  Using the toilet:No  Moving around from place to place: No  In the past year have you fallen or had a near fall?:No  Are you sexually active? No  Do you have more than one partner? No   Hearing Difficulties: No  Do you often ask people to speak up or repeat themselves? No  Do you experience ringing or noises in your ears? No  Do you have difficulty understanding soft or whispered voices? No  Do you feel that you have a problem with memory? No Do you often misplace items? No    Home Safety:  Do you have a smoke alarm at your residence? Yes Do you have grab bars in the bathroom?Yes Do you have throw rugs in your house? Yes   Cognitive Testing  Alert? Yes Normal Appearance?Yes  Oriented to person? Yes Place? Yes  Time? Yes  Recall of three objects? Yes  Can perform simple calculations? Yes  Displays appropriate judgment?Yes  Can read the correct time from a watch face?Yes   List the Names of Other Physician/Practitioners you currently use:  See referral list for the physicians patient is currently seeing.     Review of Systems: See above   Objective:     General appearance: Appears stated age Head: Normocephalic, without obvious abnormality, atraumatic  Eyes: conj clear, EOMi PEERLA  Ears: normal TM's and external  ear canals both ears  Nose: Nares normal. Septum midline. Mucosa normal. No drainage or sinus tenderness.  Throat: lips, mucosa, and tongue normal; teeth and gums normal  Neck: no adenopathy, no carotid bruit, no JVD, supple, symmetrical, trachea midline and thyroid not enlarged, symmetric, no tenderness/mass/nodules  No CVA tenderness.  Lungs: clear to auscultation bilaterally  Breasts: normal appearance, no masses or tenderness Heart: regular rate and rhythm, S1, S2 normal, no murmur, click, rub or gallop  Abdomen: soft, non-tender; bowel sounds normal; no masses, no organomegaly  Musculoskeletal: ROM normal in all joints, no crepitus, no deformity, Normal muscle strengthen. Back  is symmetric, no curvature. Skin: Skin color, texture, turgor normal. No rashes or lesions  Lymph nodes: Cervical, supraclavicular, and axillary nodes normal.  Neurologic: CN 2 -12 Normal, Normal symmetric reflexes. Normal coordination and gait  Psych: Alert & Oriented x 3, Mood appear stable.    Assessment:    Annual wellness medicare exam   Plan:    During the course of the visit the patient was educated and counseled about appropriate screening and preventive services including:   Annual mammogram  Bone density every 2 years     Patient Instructions (the written plan) was given to the patient.  Medicare Attestation  I have personally reviewed:  The patient's medical and social history  Their use of alcohol, tobacco or illicit drugs  Their current medications and supplements  The patient's functional ability including ADLs,fall risks, home safety risks, cognitive, and hearing and visual impairment  Diet and physical activities  Evidence for depression or mood disorders  The patient's weight, height, BMI, and visual acuity have been recorded in the chart. I have made referrals, counseling, and provided education to the patient based on review of the above and I have provided the patient with a  written personalized care plan for preventive services.

## 2016-02-24 ENCOUNTER — Other Ambulatory Visit: Payer: Self-pay

## 2016-02-24 MED ORDER — SITAGLIPTIN PHOSPHATE 100 MG PO TABS: 100.0000 mg | ORAL_TABLET | Freq: Every day | ORAL | Status: DC

## 2016-02-24 MED ORDER — SIMVASTATIN 20 MG PO TABS: 20.0000 mg | ORAL_TABLET | Freq: Once | ORAL | Status: DC

## 2016-02-26 ENCOUNTER — Encounter: Payer: Self-pay | Admitting: Internal Medicine

## 2016-03-09 DIAGNOSIS — E1122 Type 2 diabetes mellitus with diabetic chronic kidney disease: Secondary | ICD-10-CM | POA: Diagnosis not present

## 2016-03-09 DIAGNOSIS — N200 Calculus of kidney: Secondary | ICD-10-CM | POA: Diagnosis not present

## 2016-03-09 DIAGNOSIS — N183 Chronic kidney disease, stage 3 (moderate): Secondary | ICD-10-CM | POA: Diagnosis not present

## 2016-03-23 DIAGNOSIS — Z1231 Encounter for screening mammogram for malignant neoplasm of breast: Secondary | ICD-10-CM | POA: Diagnosis not present

## 2016-03-23 DIAGNOSIS — Z6831 Body mass index (BMI) 31.0-31.9, adult: Secondary | ICD-10-CM | POA: Diagnosis not present

## 2016-03-23 DIAGNOSIS — Z124 Encounter for screening for malignant neoplasm of cervix: Secondary | ICD-10-CM | POA: Diagnosis not present

## 2016-03-23 DIAGNOSIS — Z01419 Encounter for gynecological examination (general) (routine) without abnormal findings: Secondary | ICD-10-CM | POA: Diagnosis not present

## 2016-04-04 DIAGNOSIS — N958 Other specified menopausal and perimenopausal disorders: Secondary | ICD-10-CM | POA: Diagnosis not present

## 2016-04-04 DIAGNOSIS — Z13820 Encounter for screening for osteoporosis: Secondary | ICD-10-CM | POA: Diagnosis not present

## 2016-04-04 DIAGNOSIS — M8588 Other specified disorders of bone density and structure, other site: Secondary | ICD-10-CM | POA: Diagnosis not present

## 2016-04-13 ENCOUNTER — Ambulatory Visit (INDEPENDENT_AMBULATORY_CARE_PROVIDER_SITE_OTHER): Payer: Medicare Other | Admitting: Internal Medicine

## 2016-04-13 ENCOUNTER — Encounter: Payer: Self-pay | Admitting: Internal Medicine

## 2016-04-13 VITALS — BP 112/84 | HR 111 | Temp 98.7°F | Resp 18 | Wt 196.0 lb

## 2016-04-13 DIAGNOSIS — H6692 Otitis media, unspecified, left ear: Secondary | ICD-10-CM

## 2016-04-13 DIAGNOSIS — J069 Acute upper respiratory infection, unspecified: Secondary | ICD-10-CM | POA: Diagnosis not present

## 2016-04-13 DIAGNOSIS — E119 Type 2 diabetes mellitus without complications: Secondary | ICD-10-CM

## 2016-04-13 MED ORDER — DOXYCYCLINE HYCLATE 100 MG PO TABS: 100.0000 mg | ORAL_TABLET | Freq: Two times a day (BID) | ORAL | Status: DC

## 2016-04-13 NOTE — Patient Instructions (Signed)
Doxycycline 100 mg twice daily for 10 days. Rest and drink plenty of fluids. May take DayQuil every morning.

## 2016-04-13 NOTE — Progress Notes (Signed)
   Subjective:    Patient ID: Ihor GullySharon E Sansone, female    DOB: 1946-07-17, 70 y.o.   MRN: [id number]  HPI Yesterday developed URI symptoms with maxillary sinus pressure particularly on left side and left ear pain. Has malaise and fatigue. No fever or shaking chills. Has diabetes mellitus controlled with oral medications which is stable. Felt slightly dizzy yesterday as well.    Review of Systems     Objective:   Physical Exam Left TM is red and full with splayed light reflex. Right TM clear. Pharynx slightly injected without exudate. Neck supple without adenopathy. Chest clear to auscultation without rales or wheezing. Skin is warm and dry. Alert and oriented.       Assessment & Plan:  Acute left otitis media  Acute URI  Controlled type 2 diabetes mellitus without complications and without insulin  Plan: Doxycycline 100 mg twice daily for 10 days.

## 2016-05-25 ENCOUNTER — Other Ambulatory Visit: Payer: Self-pay

## 2016-05-25 MED ORDER — METFORMIN HCL 500 MG PO TABS: ORAL_TABLET | ORAL | 3 refills | Status: DC

## 2016-05-25 MED ORDER — SERTRALINE HCL 100 MG PO TABS
100.0000 mg | ORAL_TABLET | Freq: Every day | ORAL | 3 refills | Status: DC
Start: 2016-05-25 — End: 2017-08-06

## 2016-05-25 MED ORDER — CLORAZEPATE DIPOTASSIUM 3.75 MG PO TABS: 3.7500 mg | ORAL_TABLET | Freq: Two times a day (BID) | ORAL | 3 refills | Status: DC

## 2016-05-25 MED ORDER — BUPROPION HCL ER (XL) 150 MG PO TB24: 150.0000 mg | ORAL_TABLET | Freq: Every day | ORAL | 3 refills | Status: DC

## 2016-06-30 DIAGNOSIS — G4733 Obstructive sleep apnea (adult) (pediatric): Secondary | ICD-10-CM | POA: Diagnosis not present

## 2016-08-04 ENCOUNTER — Other Ambulatory Visit (INDEPENDENT_AMBULATORY_CARE_PROVIDER_SITE_OTHER): Payer: Medicare Other | Admitting: Internal Medicine

## 2016-08-04 DIAGNOSIS — E785 Hyperlipidemia, unspecified: Secondary | ICD-10-CM

## 2016-08-04 DIAGNOSIS — E138 Other specified diabetes mellitus with unspecified complications: Secondary | ICD-10-CM | POA: Diagnosis not present

## 2016-08-04 LAB — HEPATIC FUNCTION PANEL
ALBUMIN: 4.2 g/dL (ref 3.6–5.1)
ALK PHOS: 62 U/L (ref 33–130)
ALT: 13 U/L (ref 6–29)
AST: 17 U/L (ref 10–35)
BILIRUBIN INDIRECT: 0.5 mg/dL (ref 0.2–1.2)
Bilirubin, Direct: 0.1 mg/dL (ref <=0.2)
TOTAL PROTEIN: 6.5 g/dL (ref 6.1–8.1)
Total Bilirubin: 0.6 mg/dL (ref 0.2–1.2)

## 2016-08-04 LAB — LIPID PANEL
CHOL/HDL RATIO: 2.8 ratio (ref <=5.0)
CHOLESTEROL: 157 mg/dL (ref 125–200)
HDL: 56 mg/dL (ref >=46)
LDL Cholesterol: 85 mg/dL (ref <130)
TRIGLYCERIDES: 78 mg/dL (ref <150)
VLDL: 16 mg/dL (ref <30)

## 2016-08-04 LAB — HEMOGLOBIN A1C
Hgb A1c MFr Bld: 6.1 % — ABNORMAL HIGH (ref <5.7)
Mean Plasma Glucose: 128 mg/dL

## 2016-08-08 ENCOUNTER — Ambulatory Visit (INDEPENDENT_AMBULATORY_CARE_PROVIDER_SITE_OTHER): Payer: Medicare Other | Admitting: Internal Medicine

## 2016-08-08 ENCOUNTER — Encounter: Payer: Self-pay | Admitting: Internal Medicine

## 2016-08-08 VITALS — BP 120/70 | HR 74 | Ht 66.0 in | Wt 200.0 lb

## 2016-08-08 DIAGNOSIS — G4733 Obstructive sleep apnea (adult) (pediatric): Secondary | ICD-10-CM | POA: Diagnosis not present

## 2016-08-08 DIAGNOSIS — F418 Other specified anxiety disorders: Secondary | ICD-10-CM

## 2016-08-08 DIAGNOSIS — E119 Type 2 diabetes mellitus without complications: Secondary | ICD-10-CM | POA: Diagnosis not present

## 2016-08-08 DIAGNOSIS — E782 Mixed hyperlipidemia: Secondary | ICD-10-CM

## 2016-08-08 DIAGNOSIS — N183 Chronic kidney disease, stage 3 unspecified: Secondary | ICD-10-CM

## 2016-08-08 DIAGNOSIS — F32A Depression, unspecified: Secondary | ICD-10-CM

## 2016-08-08 DIAGNOSIS — F419 Anxiety disorder, unspecified: Secondary | ICD-10-CM

## 2016-08-08 DIAGNOSIS — F329 Major depressive disorder, single episode, unspecified: Secondary | ICD-10-CM

## 2016-08-08 NOTE — Progress Notes (Signed)
   Subjective:    Patient ID: Diana George, female    DOB: 08-04-1946, 70 y.o.   MRN: [id number]  HPI   70 year old Female for 6 month follow up.History of controlled type 2 diabetes mellitus and hyperlipidemia. History of anxiety depression. Says she's been trying to do better with diet and exercise. No new complaints. She is on metformin, Zoloft, Zocor, Januvia Tranxene and Wellbutrin. History of chronic kidney disease. Creatinine in April 2016 was 1.24. Has been seen by nephrology. In April of this year creatinine was normal at 0.99. Estimated GFR in April was 58 mL/m    Review of Systems     Objective:   Physical Exam  Constitutional: She appears well-developed and well-nourished.  Neck: Neck supple. No thyromegaly present.  Chest clear to auscultation. Cardiac exam regular rate and rhythm. Extremities without edema. Good exam is benign.        Assessment & Plan:  Hyperlipidemia-stable on statin therapy and normal results. Liver functions are normal.  Type 2 diabetes mellitus-hemoglobin A1c 6.1% and 6 months ago was 6.2%. Continue same regimen.  Anxiety depression- stable  Chronic kidney disease-stable  Plan: Return in 6 months for physical exam or as needed.

## 2016-08-29 NOTE — Patient Instructions (Signed)
It was a pleasure to see today. Continue same medications and return in 6 months. 

## 2016-10-09 DIAGNOSIS — G4733 Obstructive sleep apnea (adult) (pediatric): Secondary | ICD-10-CM | POA: Diagnosis not present

## 2016-11-08 DIAGNOSIS — G4733 Obstructive sleep apnea (adult) (pediatric): Secondary | ICD-10-CM | POA: Diagnosis not present

## 2016-11-09 DIAGNOSIS — L821 Other seborrheic keratosis: Secondary | ICD-10-CM | POA: Diagnosis not present

## 2016-11-09 DIAGNOSIS — Z23 Encounter for immunization: Secondary | ICD-10-CM | POA: Diagnosis not present

## 2016-11-09 DIAGNOSIS — D225 Melanocytic nevi of trunk: Secondary | ICD-10-CM | POA: Diagnosis not present

## 2016-11-09 DIAGNOSIS — L814 Other melanin hyperpigmentation: Secondary | ICD-10-CM | POA: Diagnosis not present

## 2016-11-09 DIAGNOSIS — D1801 Hemangioma of skin and subcutaneous tissue: Secondary | ICD-10-CM | POA: Diagnosis not present

## 2016-12-24 ENCOUNTER — Encounter: Payer: Self-pay | Admitting: Adult Health

## 2016-12-25 ENCOUNTER — Encounter: Payer: Self-pay | Admitting: Adult Health

## 2016-12-25 ENCOUNTER — Ambulatory Visit (INDEPENDENT_AMBULATORY_CARE_PROVIDER_SITE_OTHER): Payer: Medicare Other | Admitting: Adult Health

## 2016-12-25 DIAGNOSIS — G4733 Obstructive sleep apnea (adult) (pediatric): Secondary | ICD-10-CM | POA: Diagnosis not present

## 2016-12-25 NOTE — Patient Instructions (Signed)
Continue on C Pap at bedtime Keep up the good work. Do not drive if sleepy Add chin strap to your C Pap Follow-up with Maurine Ministerennis as discussed Follow-up with Dr. Vassie LollAlva in 1 year and as needed

## 2016-12-25 NOTE — Addendum Note (Signed)
Addended by: Boone MasterJONES, Zack Crager E on: 12/25/2016 11:32 AM   Modules accepted: Orders

## 2016-12-25 NOTE — Assessment & Plan Note (Signed)
Well controlled on C Pap at bedtime Discussed teeth grinding options including adding a chin strap to her current C Pap mask Will follow-up with dentist tomorrow as planned  Plan  Patient Instructions  Continue on C Pap at bedtime Keep up the good work. Do not drive if sleepy Add chin strap to your C Pap Follow-up with Maurine Ministerennis as discussed Follow-up with Dr. Vassie LollAlva in 1 year and as needed

## 2016-12-25 NOTE — Progress Notes (Signed)
 @Patient  ID: Diana GullySharon E George, female    DOB: 10-02-1946, 71 y.o.   MRN: [id number]  Chief Complaint  Patient presents with  . Follow-up    OSA    Referring provider: Margaree MackintoshBaxley, Mary J, MD  HPI: 71 year old female followed for severe obstructive sleep apnea on nocturnal C Pap  TEST  NPSG 2005:  AHI 14/hr, 280 PLMS with 7/hr with arousal/awakening. HST 2015:  34/hr with low sat 82% one night, and AHI 4/hr another night that she did not sleep well.   12/25/2016 Follow up : OSA  Patient returns for a follow-up for sleep apnea. Patient is on nocturnal C Pap. She says that she is doing well on C Pap. Overall. She denies any significant daytime sleepiness. She wears her C Pap every night for at least 7 hours. Download shows excellent compliance with average. Uses at 7 hours. She is on AutoSet of 5-12 cm of H2O. AHI was 1.5. Minimal leaks. Last seen in 2016 , was previous pt of Dr. Shelle Ironlance .    Has had some issues with teeth grinding . Seen dentist and now has a guard.    Allergies  Allergen Reactions  . Amoxicillin Rash    Immunization History  Administered Date(s) Administered  . Influenza Split 08/09/2012, 08/30/2016  . Influenza,inj,Quad PF,36+ Mos 08/27/2013, 08/19/2014, 08/06/2015  . Pneumococcal Conjugate-13 02/03/2016  . Pneumococcal Polysaccharide-23 11/13/2009  . Tdap 10/27/2011  . Zoster 12/06/2010    Past Medical History:  Diagnosis Date  . Anxiety   . Depression   . Diabetes mellitus   . History of coronary rotational ablation 2006  . Hyperlipidemia   . Renal stone   . Sleep apnea    uses cpap    Tobacco History: History  Smoking Status  . Never Smoker  Smokeless Tobacco  . Never Used   Counseling given: Not Answered   Outpatient Encounter Prescriptions as of 12/25/2016  Medication Sig  . buPROPion (WELLBUTRIN XL) 150 MG 24 hr tablet Take 1 tablet (150 mg total) by mouth daily.  . Cholecalciferol (VITAMIN D) 2000 UNITS CAPS Take 1 capsule by mouth  daily.   . clorazepate (TRANXENE) 3.75 MG tablet Take 1 tablet (3.75 mg total) by mouth 2 (two) times daily.  . metFORMIN (GLUCOPHAGE) 500 MG tablet Take 1 tab by mouth twice daily  . sertraline (ZOLOFT) 100 MG tablet Take 1 tablet (100 mg total) by mouth daily.  . simvastatin (ZOCOR) 20 MG tablet Take 1 tablet (20 mg total) by mouth once.  . sitaGLIPtin (JANUVIA) 100 MG tablet Take 1 tablet (100 mg total) by mouth daily.  . TRUETEST TEST test strip TEST TWICE A DAY   No facility-administered encounter medications on file as of 12/25/2016.      Review of Systems  Constitutional:   No  weight loss, night sweats,  Fevers, chills, fatigue, or  lassitude.  HEENT:   No headaches,  Difficulty swallowing,  Tooth/dental problems, or  Sore throat,                No sneezing, itching, ear ache, nasal congestion, post nasal drip, +teeth grinding   CV:  No chest pain,  Orthopnea, PND, swelling in lower extremities, anasarca, dizziness, palpitations, syncope.   GI  No heartburn, indigestion, abdominal pain, nausea, vomiting, diarrhea, change in bowel habits, loss of appetite, bloody stools.   Resp: No shortness of breath with exertion or at rest.  No excess mucus, no productive cough,  No non-productive cough,  No coughing up of blood.  No change in color of mucus.  No wheezing.  No chest wall deformity  Skin: no rash or lesions.  GU: no dysuria, change in color of urine, no urgency or frequency.  No flank pain, no hematuria   MS:  No joint pain or swelling.  No decreased range of motion.  No back pain.    Physical Exam  BP 112/64 (BP Location: Left Arm, Cuff Size: Normal)   Pulse 89   Ht 5' 5.5" (1.664 m)   Wt 199 lb 6.4 oz (90.4 kg)   SpO2 94%   BMI 32.68 kg/m   GEN: A/Ox3; pleasant , NAD, well nourished    HEENT:  /AT,  EACs-clear, TMs-wnl, NOSE-clear, THROAT-clear, no lesions, no postnasal drip or exudate noted. Class 3 MP airway   NECK:  Supple w/ fair ROM; no JVD; normal  carotid impulses w/o bruits; no thyromegaly or nodules palpated; no lymphadenopathy.    RESP  Clear  P & A; w/o, wheezes/ rales/ or rhonchi. no accessory muscle use, no dullness to percussion  CARD:  RRR, no m/r/g, no peripheral edema, pulses intact, no cyanosis or clubbing.  GI:   Soft & nt; nml bowel sounds; no organomegaly or masses detected.   Musco: Warm bil, no deformities or joint swelling noted.   Neuro: alert, no focal deficits noted.    Skin: Warm, no lesions or rashes    Lab Results:  CBC    Component Value Date/Time   WBC 6.0 02/01/2016 0909   RBC 4.31 02/01/2016 0909   HGB 13.0 02/01/2016 0909   HCT 40.3 02/01/2016 0909   PLT 326 02/01/2016 0909   MCV 93.5 02/01/2016 0909   MCH 30.2 02/01/2016 0909   MCHC 32.3 02/01/2016 0909   RDW 13.2 02/01/2016 0909   LYMPHSABS 1,800 02/01/2016 0909   MONOABS 420 02/01/2016 0909   EOSABS 180 02/01/2016 0909   BASOSABS 60 02/01/2016 0909    BMET    Component Value Date/Time   NA 141 02/01/2016 0909   K 4.7 02/01/2016 0909   CL 107 02/01/2016 0909   CO2 25 02/01/2016 0909   GLUCOSE 116 (H) 02/01/2016 0909   BUN 18 02/01/2016 0909   CREATININE 0.99 02/01/2016 0909   CALCIUM 8.8 02/01/2016 0909   GFRNONAA 58 (L) 02/01/2016 0909   GFRAA 67 02/01/2016 0909    BNP No results found for: BNP  ProBNP No results found for: PROBNP  Imaging: No results found.   Assessment & Plan:   OSA (obstructive sleep apnea) Well controlled on C Pap at bedtime Discussed teeth grinding options including adding a chin strap to her current C Pap mask Will follow-up with dentist tomorrow as planned  Plan  Patient Instructions  Continue on C Pap at bedtime Keep up the good work. Do not drive if sleepy Add chin strap to your C Pap Follow-up with Maurine Minister as discussed Follow-up with Dr. Vassie Loll in 1 year and as needed       Rubye Oaks, NP 12/25/2016

## 2017-01-01 NOTE — Progress Notes (Signed)
Reviewed & agree with plan  

## 2017-01-03 DIAGNOSIS — G4733 Obstructive sleep apnea (adult) (pediatric): Secondary | ICD-10-CM | POA: Diagnosis not present

## 2017-01-31 ENCOUNTER — Other Ambulatory Visit: Payer: Self-pay

## 2017-01-31 MED ORDER — CLORAZEPATE DIPOTASSIUM 3.75 MG PO TABS: 3.7500 mg | ORAL_TABLET | Freq: Two times a day (BID) | ORAL | 1 refills | Status: DC

## 2017-02-01 ENCOUNTER — Other Ambulatory Visit: Payer: Medicare Other | Admitting: Internal Medicine

## 2017-02-05 ENCOUNTER — Encounter: Payer: Medicare Other | Admitting: Internal Medicine

## 2017-02-09 ENCOUNTER — Encounter: Payer: Self-pay | Admitting: Internal Medicine

## 2017-02-09 DIAGNOSIS — H5203 Hypermetropia, bilateral: Secondary | ICD-10-CM | POA: Diagnosis not present

## 2017-02-09 LAB — HM DIABETES EYE EXAM

## 2017-03-05 DIAGNOSIS — Z029 Encounter for administrative examinations, unspecified: Secondary | ICD-10-CM

## 2017-03-29 ENCOUNTER — Other Ambulatory Visit: Payer: Medicare Other | Admitting: Internal Medicine

## 2017-03-29 DIAGNOSIS — N183 Chronic kidney disease, stage 3 unspecified: Secondary | ICD-10-CM

## 2017-03-29 DIAGNOSIS — E782 Mixed hyperlipidemia: Secondary | ICD-10-CM

## 2017-03-29 DIAGNOSIS — Z0000 Encounter for general adult medical examination without abnormal findings: Secondary | ICD-10-CM | POA: Diagnosis not present

## 2017-03-29 DIAGNOSIS — E119 Type 2 diabetes mellitus without complications: Secondary | ICD-10-CM | POA: Diagnosis not present

## 2017-03-29 LAB — CBC WITH DIFFERENTIAL/PLATELET
Basophils Absolute: 0 cells/uL (ref 0–200)
Basophils Relative: 0 %
EOS ABS: 284 {cells}/uL (ref 15–500)
Eosinophils Relative: 4 %
HEMATOCRIT: 40.5 % (ref 35.0–45.0)
Hemoglobin: 13.1 g/dL (ref 11.7–15.5)
LYMPHS PCT: 30 %
Lymphs Abs: 2130 cells/uL (ref 850–3900)
MCH: 30.4 pg (ref 27.0–33.0)
MCHC: 32.3 g/dL (ref 32.0–36.0)
MCV: 94 fL (ref 80.0–100.0)
MONO ABS: 568 {cells}/uL (ref 200–950)
MPV: 10.7 fL (ref 7.5–12.5)
Monocytes Relative: 8 %
NEUTROS PCT: 58 %
Neutro Abs: 4118 cells/uL (ref 1500–7800)
Platelets: 329 10*3/uL (ref 140–400)
RBC: 4.31 MIL/uL (ref 3.80–5.10)
RDW: 13.4 % (ref 11.0–15.0)
WBC: 7.1 10*3/uL (ref 3.8–10.8)

## 2017-03-29 LAB — COMPLETE METABOLIC PANEL WITH GFR
ALT: 10 U/L (ref 6–29)
AST: 12 U/L (ref 10–35)
Albumin: 4.1 g/dL (ref 3.6–5.1)
Alkaline Phosphatase: 66 U/L (ref 33–130)
BUN: 19 mg/dL (ref 7–25)
CHLORIDE: 106 mmol/L (ref 98–110)
CO2: 22 mmol/L (ref 20–31)
Calcium: 9.2 mg/dL (ref 8.6–10.4)
Creat: 1.21 mg/dL — ABNORMAL HIGH (ref 0.60–0.93)
GFR, EST AFRICAN AMERICAN: 52 mL/min — AB (ref >=60)
GFR, Est Non African American: 45 mL/min — ABNORMAL LOW (ref >=60)
Glucose, Bld: 121 mg/dL — ABNORMAL HIGH (ref 65–99)
POTASSIUM: 4.4 mmol/L (ref 3.5–5.3)
Sodium: 142 mmol/L (ref 135–146)
Total Bilirubin: 0.6 mg/dL (ref 0.2–1.2)
Total Protein: 6.2 g/dL (ref 6.1–8.1)

## 2017-03-29 LAB — LIPID PANEL
CHOL/HDL RATIO: 3 ratio (ref <5.0)
CHOLESTEROL: 142 mg/dL (ref <200)
HDL: 48 mg/dL — ABNORMAL LOW (ref >50)
LDL Cholesterol: 69 mg/dL (ref <100)
TRIGLYCERIDES: 126 mg/dL (ref <150)
VLDL: 25 mg/dL (ref <30)

## 2017-03-29 LAB — TSH: TSH: 2.27 mIU/L

## 2017-03-30 LAB — MICROALBUMIN / CREATININE URINE RATIO
Creatinine, Urine: 291 mg/dL (ref 20–320)
MICROALB UR: 3.7 mg/dL
Microalb Creat Ratio: 13 mcg/mg creat (ref <30)

## 2017-03-30 LAB — HEMOGLOBIN A1C
Hgb A1c MFr Bld: 6.2 % — ABNORMAL HIGH (ref <5.7)
MEAN PLASMA GLUCOSE: 131 mg/dL

## 2017-04-02 ENCOUNTER — Encounter: Payer: Self-pay | Admitting: Internal Medicine

## 2017-04-02 ENCOUNTER — Ambulatory Visit (INDEPENDENT_AMBULATORY_CARE_PROVIDER_SITE_OTHER): Payer: Medicare Other | Admitting: Internal Medicine

## 2017-04-02 VITALS — BP 112/64 | HR 84 | Temp 98.7°F | Ht 65.25 in | Wt 194.0 lb

## 2017-04-02 DIAGNOSIS — N183 Chronic kidney disease, stage 3 unspecified: Secondary | ICD-10-CM

## 2017-04-02 DIAGNOSIS — E119 Type 2 diabetes mellitus without complications: Secondary | ICD-10-CM

## 2017-04-02 DIAGNOSIS — F329 Major depressive disorder, single episode, unspecified: Secondary | ICD-10-CM

## 2017-04-02 DIAGNOSIS — G4733 Obstructive sleep apnea (adult) (pediatric): Secondary | ICD-10-CM

## 2017-04-02 DIAGNOSIS — Z0000 Encounter for general adult medical examination without abnormal findings: Secondary | ICD-10-CM | POA: Diagnosis not present

## 2017-04-02 DIAGNOSIS — Z87442 Personal history of urinary calculi: Secondary | ICD-10-CM | POA: Diagnosis not present

## 2017-04-02 DIAGNOSIS — E782 Mixed hyperlipidemia: Secondary | ICD-10-CM

## 2017-04-02 DIAGNOSIS — F419 Anxiety disorder, unspecified: Secondary | ICD-10-CM

## 2017-04-02 DIAGNOSIS — F32A Depression, unspecified: Secondary | ICD-10-CM

## 2017-04-02 LAB — POCT URINALYSIS DIPSTICK
Bilirubin, UA: NEGATIVE
Blood, UA: NEGATIVE
Glucose, UA: NEGATIVE
LEUKOCYTES UA: NEGATIVE
Nitrite, UA: NEGATIVE
PH UA: 5 (ref 5.0–8.0)
PROTEIN UA: NEGATIVE
UROBILINOGEN UA: 0.2 U/dL

## 2017-04-02 NOTE — Progress Notes (Signed)
Subjective:    Patient ID: Diana George, female    DOB: 1946/10/28, 71 y.o.   MRN: [id number]  HPI 71 year old Female For health maintenance exam, Medicare wellness exam and evaluation of medical issues. She has a history of hyperlipidemia, controlled type 2 diabetes mellitus, anxiety depression.  History of lithotripsy for kidney stones right renal pelvis and right lower pole kidney 03/30/2015.  Past medical history: Bilateral tubal ligation 1980, stone basket extraction for kidney stone in the mid 1970s, fractured left wrist November 2007 due to a fall in addition to a right anterior cruciate ligament tear or meniscal tear. History of cataract extraction left eye March 2011.  Normal Cardiolite study 2001-03-29. 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 The study was done for syncope. She had mild mitral regurgitation. Was seen by Dr. Sharrell Ku in 2004/03/29 for recurrent supraventricular tachycardia. She had a catheter ablation September 2005.  She had an allergy evaluation 2001/03/29 with no significant reaction various tree pollens, house dust or dust mites. She had reaction to molds. No food allergies. Spirometry at that time was normal.  She is allergic to amoxicillin-causes a rash.  Social history: She is divorced. Resides alone. She is an only child. 2 adult children. Does not smoke or consume alcohol. 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  Family history: [place] 2013/03/29 her father died of septicemia with history of MI, coronary artery disease and hematoma of the brain. He was 71 years old. In Aug 31, 2014her mother died with history of dementia and urinary tract infection at age 1.  Normal colonoscopy by Dr. Juanda Chance June 2016 with 10 year follow-up recommended.  Gave order for bone density but order expired 03/30/2015.  Order expired for mammography 03-30-15.  Followed by Dr. Briant Cedar for chronic kidney disease.  Review of Systems    Constitutional: Negative.   All other systems reviewed and are negative.      Objective:   Physical Exam  Constitutional: She is oriented to person, place, and time. She appears well-developed and well-nourished. No distress.  HENT:  Head: Normocephalic and atraumatic.  Right Ear: External ear normal.  Left Ear: External ear normal.  Mouth/Throat: Oropharynx is clear and moist. No oropharyngeal exudate.  Eyes: Conjunctivae and EOM are normal. Pupils are equal, round, and reactive to light. Right eye exhibits no discharge. Left eye exhibits no discharge. No scleral icterus.  Neck: Neck supple. No JVD present. No thyromegaly present.  Cardiovascular: Normal rate, regular rhythm, normal heart sounds and intact distal pulses.   No murmur heard. Pulmonary/Chest: Effort normal and breath sounds normal. She has no wheezes. She has no rales.  Breasts normal female  Abdominal: Bowel sounds are normal. She exhibits no distension. There is no tenderness. There is no rebound and no guarding.  Genitourinary:  Genitourinary Comments: Deferred  Musculoskeletal: She exhibits no edema.  Lymphadenopathy:    She has no cervical adenopathy.  Neurological: She is alert and oriented to person, place, and time. She has normal reflexes. No cranial nerve deficit. Coordination normal.  Skin: Skin is warm and dry. No rash noted. She is not diaphoretic.  Psychiatric: She has a normal mood and affect. Her behavior is normal. Thought content normal.  Vitals reviewed.         Assessment & Plan:  History of anxiety depression-stable  Type 2 diabetes mellitus  Hyperlipidemia  History of kidney stones  Allergic rhinitis  History  of SVT status post catheter ablation  History of sleep apnea  Plan: Hemoglobin A1c stable at 6.2%. TSH and lipid panel normal. Vitamin D level is normal. CBC is normal. Fasting glucose is 122. Continue to work on diet exercise and weight loss and return in 6  months  Subjective:   Patient presents for Medicare Annual/Subsequent preventive examination.  Review Past Medical/Family/Social:See above   Risk Factors  Current exercise habits: Light exercise-could do more Dietary issues discussed: Low fat low carbohydrate  Cardiac risk factors: Diabetes and hyperlipidemia  Depression Screen  (Note: if answer to either of the following is "Yes", a more complete depression screening is indicated)   Over the past two weeks, have you felt down, depressed or hopeless? No  Over the past two weeks, have you felt little interest or pleasure in doing things? No Have you lost interest or pleasure in daily life? No Do you often feel hopeless? No Do you cry easily over simple problems? No   Activities of Daily Living  In your present state of health, do you have any difficulty performing the following activities?:   Driving? No  Managing money? No  Feeding yourself? No  Getting from bed to chair? No  Climbing a flight of stairs? No  Preparing food and eating?: No  Bathing or showering? No  Getting dressed: No  Getting to the toilet? No  Using the toilet:No  Moving around from place to place: No  In the past year have you fallen or had a near fall?:Tripped Are you sexually active? No  Do you have more than one partner? No   Hearing Difficulties: No  Do you often ask people to speak up or repeat themselves? No  Do you experience ringing or noises in your ears? No  Do you have difficulty understanding soft or whispered voices? No  Do you feel that you have a problem with memory? No Do you often misplace items? No    Home Safety:  Do you have a smoke alarm at your residence? Yes Do you have grab bars in the bathroom?Yes Do you have throw rugs in your house? Yes   Cognitive Testing  Alert? Yes Normal Appearance?Yes  Oriented to person? Yes Place? Yes  Time? Yes  Recall of three objects? Yes  Can perform simple calculations? Yes   Displays appropriate judgment?Yes  Can read the correct time from a watch face?Yes   List the Names of Other Physician/Practitioners you currently use:  See referral list for the physicians patient is currently seeing.     Review of Systems: See above Objective:     General appearance: Appears stated age and mildly obese  Head: Normocephalic, without obvious abnormality, atraumatic  Eyes: conj clear, EOMi PEERLA  Ears: normal TM's and external ear canals both ears  Nose: Nares normal. Septum midline. Mucosa normal. No drainage or sinus tenderness.  Throat: lips, mucosa, and tongue normal; teeth and gums normal  Neck: no adenopathy, no carotid bruit, no JVD, supple, symmetrical, trachea midline and thyroid not enlarged, symmetric, no tenderness/mass/nodules  No CVA tenderness.  Lungs: clear to auscultation bilaterally  Breasts: normal appearance, no masses or tenderness, .  Heart: regular rate and rhythm, S1, S2 normal, no murmur, click, rub or gallop  Abdomen: soft, non-tender; bowel sounds normal; no masses, no organomegaly  Musculoskeletal: ROM normal in all joints, no crepitus, no deformity, Normal muscle strengthen. Back  is symmetric, no curvature. Skin: Skin color, texture, turgor normal. No rashes or lesions  Lymph nodes: Cervical, supraclavicular, and axillary nodes normal.  Neurologic: CN 2 -12 Normal, Normal symmetric reflexes. Normal coordination and gait  Psych: Alert & Oriented x 3, Mood appear stable.    Assessment:    Annual wellness medicare exam   Plan:    During the course of the visit the patient was educated and counseled about appropriate screening and preventive services including:   Annual mammogram  Bone density study every 2 years  Annual flu vaccination today     Patient Instructions (the written plan) was given to the patient.  Medicare Attestation  I have personally reviewed:  The patient's medical and social history  Their use of  alcohol, tobacco or illicit drugs  Their current medications and supplements  The patient's functional ability including ADLs,fall risks, home safety risks, cognitive, and hearing and visual impairment  Diet and physical activities  Evidence for depression or mood disorders  The patient's weight, height, BMI, and visual acuity have been recorded in the chart. I have made referrals, counseling, and provided education to the patient based on review of the above and I have provided the patient with a written personalized care plan for preventive services.

## 2017-04-05 NOTE — Patient Instructions (Addendum)
It was a pleasure to see you today. Continue to work on diet exercise and weight loss and return in 6 months. Continue follow-up with [facility].

## 2017-04-09 DIAGNOSIS — G4733 Obstructive sleep apnea (adult) (pediatric): Secondary | ICD-10-CM | POA: Diagnosis not present

## 2017-04-25 ENCOUNTER — Telehealth: Payer: Self-pay

## 2017-04-25 MED ORDER — SIMVASTATIN 20 MG PO TABS: 20.0000 mg | ORAL_TABLET | Freq: Once | ORAL | 3 refills | Status: DC

## 2017-04-25 MED ORDER — SITAGLIPTIN PHOSPHATE 100 MG PO TABS: 100.0000 mg | ORAL_TABLET | Freq: Every day | ORAL | 3 refills | Status: DC

## 2017-04-25 NOTE — Telephone Encounter (Signed)
Received fax from [facility] in regards to a refill on Simvastatin 20mg  and Januvia 100mg  for patient. Medication was refilled per Dr. Beryle QuantBaxley's request. Sent to [facility]

## 2017-05-23 DIAGNOSIS — N200 Calculus of kidney: Secondary | ICD-10-CM | POA: Diagnosis not present

## 2017-05-23 DIAGNOSIS — E1122 Type 2 diabetes mellitus with diabetic chronic kidney disease: Secondary | ICD-10-CM | POA: Diagnosis not present

## 2017-05-23 DIAGNOSIS — N183 Chronic kidney disease, stage 3 (moderate): Secondary | ICD-10-CM | POA: Diagnosis not present

## 2017-05-23 DIAGNOSIS — Z6832 Body mass index (BMI) 32.0-32.9, adult: Secondary | ICD-10-CM | POA: Diagnosis not present

## 2017-06-25 ENCOUNTER — Ambulatory Visit (INDEPENDENT_AMBULATORY_CARE_PROVIDER_SITE_OTHER): Payer: Medicare Other | Admitting: Internal Medicine

## 2017-06-25 VITALS — BP 100/76 | HR 76 | Temp 99.2°F | Wt 196.0 lb

## 2017-06-25 DIAGNOSIS — M6281 Muscle weakness (generalized): Secondary | ICD-10-CM

## 2017-06-25 DIAGNOSIS — G250 Essential tremor: Secondary | ICD-10-CM | POA: Diagnosis not present

## 2017-06-25 DIAGNOSIS — H9313 Tinnitus, bilateral: Secondary | ICD-10-CM

## 2017-06-25 MED ORDER — PREDNISONE 10 MG PO TABS: ORAL_TABLET | ORAL | 0 refills | Status: DC

## 2017-06-25 NOTE — Patient Instructions (Signed)
Labs drawn and pending regarding muscle weakness. Consider PT if prednisone does not help musculoskeletal pain. Take prednisone which may help bilateral serous otitis media worse on the left than the right. Return in 2 weeks for follow-up and hearing test

## 2017-06-26 ENCOUNTER — Encounter: Payer: Self-pay | Admitting: Internal Medicine

## 2017-06-26 LAB — ANA, IFA COMPREHENSIVE PANEL
Anti Nuclear Antibody(ANA): NEGATIVE
DS DNA AB: 1 [IU]/mL
ENA SM Ab Ser-aCnc: <1
SM/RNP: NEGATIVE
SSA (Ro) (ENA) Antibody, IgG: <1
SSB (La) (ENA) Antibody, IgG: <1
Scleroderma (Scl-70) (ENA) Antibody, IgG: <1

## 2017-06-26 LAB — LACTATE DEHYDROGENASE: LDH: 131 U/L (ref 120–250)

## 2017-06-26 LAB — RHEUMATOID FACTOR

## 2017-06-26 LAB — SEDIMENTATION RATE: Sed Rate: 1 mm/hr (ref 0–30)

## 2017-06-26 NOTE — Progress Notes (Signed)
   Subjective:    Patient ID: Diana George, female    DOB: 10-20-1946, 71 y.o.   MRN: [id number]  HPI 71 year old Female complaining of recent onset of tinnitus over the past couple of weeks. Father had hearing problems and she's concerned about this. She River antidepressants could cause tinnitus. She's had some congestion and postnasal drip.  A new issue is that she will got up from a very tall bed and as she put her feet on the floor she felt some generalized weakness in her legs bilaterally. She felt as if they would give way from under her. She felt the same sensation in the shower. She sat down in the shower and finished her shower. She doesn't know what caused this. She's been having some left buttock pain after a recent fall. She is concerned about that as well. It is not radiating down her leg but is tender in her left medial gluteal area to deep palpation.  Feels that her muscles are weak in general. She's not sure why.  She has a bilateral fine tremor. It's been present for several years.   Review of Systems     Objective:   Physical Exam Her hearing was checked in the right ear at 40 dB and she heard all times. She missed a couple of times in her left ear at 40 dB. However she has a left serous otitis media which may be affecting her hearing. Left TM is full compared to the right TM. She is alert oriented 3. She has a bilateral fine tremor worse on the left than the right. She has no cogwheel rigidity. Her muscle strength in the upper and lower extremities is normal. She has palpable left medial gluteal tenderness to deep palpation which I think is musculoskeletal from her recent fall. Do not think she has a spinal fracture. She ambulates okay.       Assessment & Plan:  History of depression-antidepressants may be causing tinnitus but she needs these medications  Tinnitus-could be due to left serous otitis media versus antidepressant medication. Recheck left ear hearing upon  return. She is on Zoloft and Wellbutrin.  Essential tremor  Reported muscle weakness-exam appears to be normal today. Have drawn numerous lab studies including aldolase, total CK, sedimentation rate, ANA, rheumatoid factor. Reevaluate in a couple of weeks.  Plan: Since she has musculoskeletal pain in her left buttock and left serous otitis media we have decided to treat her with a short course of prednisone going from 60 mg to 0 mg over 7 days.  30 minutes spent with patient

## 2017-06-28 LAB — ALDOLASE

## 2017-07-06 DIAGNOSIS — G4733 Obstructive sleep apnea (adult) (pediatric): Secondary | ICD-10-CM | POA: Diagnosis not present

## 2017-07-09 ENCOUNTER — Ambulatory Visit: Payer: Medicare Other | Admitting: Internal Medicine

## 2017-07-09 ENCOUNTER — Ambulatory Visit (INDEPENDENT_AMBULATORY_CARE_PROVIDER_SITE_OTHER): Payer: Medicare Other | Admitting: Internal Medicine

## 2017-07-09 VITALS — BP 92/60 | HR 86 | Temp 98.5°F | Wt 194.0 lb

## 2017-07-09 DIAGNOSIS — H9313 Tinnitus, bilateral: Secondary | ICD-10-CM | POA: Diagnosis not present

## 2017-07-09 DIAGNOSIS — N183 Chronic kidney disease, stage 3 unspecified: Secondary | ICD-10-CM

## 2017-07-09 DIAGNOSIS — M6281 Muscle weakness (generalized): Secondary | ICD-10-CM

## 2017-07-09 DIAGNOSIS — R42 Dizziness and giddiness: Secondary | ICD-10-CM | POA: Diagnosis not present

## 2017-07-09 DIAGNOSIS — E119 Type 2 diabetes mellitus without complications: Secondary | ICD-10-CM | POA: Diagnosis not present

## 2017-07-09 DIAGNOSIS — Z789 Other specified health status: Secondary | ICD-10-CM | POA: Diagnosis not present

## 2017-07-09 LAB — CK TOTAL AND CKMB (NOT AT ARMC): Total CK: 48 U/L (ref 29–143)

## 2017-07-09 NOTE — Progress Notes (Signed)
   Subjective:    Patient ID: Diana George, female    DOB: 29-Mar-1946, 71 y.o.   MRN: [id number]  HPI 71 year old Female for follow up on malaise,fatigue, tinnitus and hearing loss. We rechecked her hearing in left ear today as we thought audiometer needed new battery. Her hearing is essentially normal in the left ear except for missing the 500 Hz tone.  At last visit she complained of recent onset of tinnitus. Still complaining of generalized weakness and fatigue which is very vague to me. No myalgias. No significant travel history. No fever. Left buttock pain has improved after a steroid taper over 7 days.  We checked many lab studies at last visit including total CK, sedimentation rate, ANA, rheumatoid factor. These were all normal. Aldolase was ordered but was not done for some reason by the lab.  Today we have checked CBC, TSH, C met. She has a history of chronic kidney disease stage III followed by [facility]. Creatinine is stable. Calcium is normal.  CBC shows elevation of 12,800 but she's recently been on prednisone. TSH is normal. Sedimentation rate is 2.  We have checked RMSF titer, Epstein-Barr titer, CMV titer. She has remote infection confirmed with Epstein-Barr and CMV. RMSF titers are negative. Lyme titers are pending.      Review of Systems     Objective:   Physical Exam Neck is supple. No adenopathy. Chest clear. Cardiac exam regular rate and rhythm. Extremities without edema. Her muscle strength appears to be normal       Assessment & Plan:  Vague musculoskeletal weakness-etiology unclear. May need to refer her to neurology to be checked for myasthenia gravis. Does not appear to have infectious etiology or metabolic etiology.  History of chronic kidney disease but labs are stable  History of diabetes mellitus  Buttock pain improved after prednisone  Complaining of tinnitus but hearing seems to be essentially normal in both the ears  History of  depression but she denies being depressed

## 2017-07-11 ENCOUNTER — Encounter: Payer: Self-pay | Admitting: Internal Medicine

## 2017-07-11 ENCOUNTER — Telehealth: Payer: Self-pay

## 2017-07-11 DIAGNOSIS — M6281 Muscle weakness (generalized): Secondary | ICD-10-CM

## 2017-07-11 LAB — B. BURGDORFI ANTIBODIES BY WB
B BURGDORFERI IGM ABS (IB): NEGATIVE
B burgdorferi IgG Abs (IB): NEGATIVE
LYME DISEASE 18 KD IGG: NONREACTIVE
LYME DISEASE 23 KD IGG: NONREACTIVE
LYME DISEASE 30 KD IGG: NONREACTIVE
LYME DISEASE 41 KD IGM: NONREACTIVE
LYME DISEASE 58 KD IGG: NONREACTIVE
Lyme Disease 23 kD IgM: NONREACTIVE
Lyme Disease 28 kD IgG: NONREACTIVE
Lyme Disease 39 kD IgG: NONREACTIVE
Lyme Disease 39 kD IgM: NONREACTIVE
Lyme Disease 41 kD IgG: NONREACTIVE
Lyme Disease 45 kD IgG: NONREACTIVE
Lyme Disease 66 kD IgG: NONREACTIVE
Lyme Disease 93 kD IgG: NONREACTIVE

## 2017-07-11 LAB — CBC WITH DIFFERENTIAL/PLATELET
BASOS ABS: 64 {cells}/uL (ref 0–200)
Basophils Relative: 0.5 %
EOS PCT: 1.8 %
Eosinophils Absolute: 230 cells/uL (ref 15–500)
HEMATOCRIT: 41.3 % (ref 35.0–45.0)
HEMOGLOBIN: 13.9 g/dL (ref 11.7–15.5)
LYMPHS ABS: 2611 {cells}/uL (ref 850–3900)
MCH: 31 pg (ref 27.0–33.0)
MCHC: 33.7 g/dL (ref 32.0–36.0)
MCV: 92.2 fL (ref 80.0–100.0)
MPV: 11.5 fL (ref 7.5–12.5)
Monocytes Relative: 5.9 %
NEUTROS ABS: 9139 {cells}/uL — AB (ref 1500–7800)
Neutrophils Relative %: 71.4 %
Platelets: 321 10*3/uL (ref 140–400)
RBC: 4.48 10*6/uL (ref 3.80–5.10)
RDW: 11.9 % (ref 11.0–15.0)
Total Lymphocyte: 20.4 %
WBC mixed population: 755 cells/uL (ref 200–950)
WBC: 12.8 10*3/uL — AB (ref 3.8–10.8)

## 2017-07-11 LAB — COMPLETE METABOLIC PANEL WITH GFR
AG Ratio: 1.9 (calc) (ref 1.0–2.5)
ALBUMIN MSPROF: 4.2 g/dL (ref 3.6–5.1)
ALT: 16 U/L (ref 6–29)
AST: 16 U/L (ref 10–35)
Alkaline phosphatase (APISO): 67 U/L (ref 33–130)
BUN / CREAT RATIO: 13 (calc) (ref 6–22)
BUN: 17 mg/dL (ref 7–25)
CALCIUM: 9.3 mg/dL (ref 8.6–10.4)
CO2: 24 mmol/L (ref 20–32)
CREATININE: 1.36 mg/dL — AB (ref 0.60–0.93)
Chloride: 107 mmol/L (ref 98–110)
GFR, EST AFRICAN AMERICAN: 46 mL/min/{1.73_m2} — AB (ref >=60)
GFR, Est Non African American: 39 mL/min/{1.73_m2} — ABNORMAL LOW (ref >=60)
Globulin: 2.2 g/dL (calc) (ref 1.9–3.7)
Glucose, Bld: 102 mg/dL — ABNORMAL HIGH (ref 65–99)
Potassium: 4.4 mmol/L (ref 3.5–5.3)
Sodium: 142 mmol/L (ref 135–146)
TOTAL PROTEIN: 6.4 g/dL (ref 6.1–8.1)
Total Bilirubin: 0.4 mg/dL (ref 0.2–1.2)

## 2017-07-11 LAB — CMV ABS, IGG+IGM (CYTOMEGALOVIRUS): Cytomegalovirus Ab-IgG: 4.8 U/mL — ABNORMAL HIGH

## 2017-07-11 LAB — SEDIMENTATION RATE: Sed Rate: 2 mm/h (ref 0–30)

## 2017-07-11 LAB — ROCKY MTN SPOTTED FVR ABS PNL(IGG+IGM)
RMSF IGG: NOT DETECTED
RMSF IGM: NOT DETECTED

## 2017-07-11 LAB — EPSTEIN-BARR VIRUS VCA ANTIBODY PANEL: EBV NA IgG: 323 U/mL — ABNORMAL HIGH

## 2017-07-11 LAB — TSH: TSH: 2.01 mIU/L (ref 0.40–4.50)

## 2017-07-11 NOTE — Patient Instructions (Signed)
Extensive lab workup is normal. Lyme titer pending. Consider referral to Neurology

## 2017-07-11 NOTE — Telephone Encounter (Signed)
Informed by Dr.Baxley to do a referral to neurology for the patient. Called and let the patient know. Pt is aware and agrees to referral

## 2017-07-19 ENCOUNTER — Encounter: Payer: Self-pay | Admitting: Neurology

## 2017-07-19 ENCOUNTER — Ambulatory Visit (INDEPENDENT_AMBULATORY_CARE_PROVIDER_SITE_OTHER): Payer: Medicare Other | Admitting: Neurology

## 2017-07-19 VITALS — BP 115/77 | HR 87 | Ht 66.0 in | Wt 196.0 lb

## 2017-07-19 DIAGNOSIS — R531 Weakness: Secondary | ICD-10-CM | POA: Diagnosis not present

## 2017-07-19 DIAGNOSIS — R251 Tremor, unspecified: Secondary | ICD-10-CM | POA: Diagnosis not present

## 2017-07-19 DIAGNOSIS — R5383 Other fatigue: Secondary | ICD-10-CM

## 2017-07-19 NOTE — Progress Notes (Signed)
Many thanks for your thorough evaluation

## 2017-07-19 NOTE — Patient Instructions (Addendum)
Your neurological exam is non-focal, with the exception of your mild tremor, which is not new.   For completion, I will order a couple of tests:   Blood work for myasthenia gravis, we will call you with the test results.  We will do an EMG and nerve conduction velocity test, which is an electrical nerve and muscle test, which we will schedule. We will call you with the results.

## 2017-07-19 NOTE — Progress Notes (Signed)
Subjective:    Patient ID: KAMARIA LUCIA is a 71 y.o. female.  HPI     [name], MD, PhD [facility] [address], [address] [place], [place] [address]  Dear Dr. Renold Genta,   I saw your patient, Jozlin Bently, upon your kind request, in my neurologic clinic today for initial consultation of her muscle weakness. The patient is unaccompanied today. As you know, Ms. Mcclain is a 72 or right-handed woman with an underlying medical history of diabetes, depression, anxiety, hyperlipidemia, kidney stone, sleep apnea and obesity, who reports an approximately one-month history of feeling weak all over, lack of energy, muscle fatigue. She has not fallen or had one-sided sudden onset of weakness or numbness. She feels intermittent tingling in her left leg. She fell about a month ago and hit the left side of her leg and buttock area. This has improved but she feels like she has no energy. She denies any double vision. She has a family history of tremors on her father's side and has had a tremor herself but this is not new. I reviewed your office note from 07/09/2017. She has had recent extensive lab work which I reviewed: She has negative titers for [facility] spotted fever, Lyme disease panel negative, prior exposures to EBV and CMV, ESR normal, TSH normal. On 06/25/2017 she had normal CK level, ANA negative, ESR normal, A1c in May 2018 6.2. She reports using her CPAP on a regular basis. She lives alone, has 2 grown children. She is retired from being in childcare for many years. She is a nonsmoker and does not drink alcohol on a regular basis or caffeine on a regular basis. She tries to hydrate well. She has been on her anxiety and depression medications for years with stable doses.  Her Past Medical History Is Significant For: Past Medical History:  Diagnosis Date  . Anxiety   . Depression   . Diabetes mellitus   . History of coronary rotational ablation 2006  .  Hyperlipidemia   . Renal stone   . Sleep apnea    uses cpap    Her Past Surgical History Is Significant For: Past Surgical History:  Procedure Laterality Date  . CATARACT EXTRACTION W/ INTRAOCULAR LENS  IMPLANT, BILATERAL  2010  . heart ablation  2010  . LEG SURGERY Left 2011   fracture with hardware  . STONE EXTRACTION WITH BASKET  2016, 1977  . TUBAL LIGATION  1977    Her Family History Is Significant For: Family History  Problem Relation Age of Onset  . Alzheimer's disease Mother   . Alzheimer's disease Father   . Heart disease Father   . Colon cancer Father 69    Her Social History Is Significant For: Social History   Social History  . Marital status: Divorced    Spouse name: N/A  . Number of children: N/A  . Years of education: N/A   Occupational History  . retired    Social History Main Topics  . Smoking status: Never Smoker  . Smokeless tobacco: Never Used  . Alcohol use No  . Drug use: No  . Sexual activity: Not Asked   Other Topics Concern  . None   Social History Narrative  . None    Her Allergies Are:  Allergies  Allergen Reactions  . Amoxicillin Rash  :   Her Current Medications Are:  Outpatient Encounter Prescriptions as of 07/19/2017  Medication Sig  . buPROPion (WELLBUTRIN XL)  150 MG 24 hr tablet Take 1 tablet (150 mg total) by mouth daily.  . Cholecalciferol (VITAMIN D) 2000 UNITS CAPS Take 1 capsule by mouth daily.   . clorazepate (TRANXENE) 3.75 MG tablet Take 1 tablet (3.75 mg total) by mouth 2 (two) times daily.  . metFORMIN (GLUCOPHAGE) 500 MG tablet Take 1 tab by mouth twice daily  . sertraline (ZOLOFT) 100 MG tablet Take 1 tablet (100 mg total) by mouth daily.  . sitaGLIPtin (JANUVIA) 100 MG tablet Take 1 tablet (100 mg total) by mouth daily.  . TRUETEST TEST test strip TEST TWICE A DAY  . simvastatin (ZOCOR) 20 MG tablet Take 1 tablet (20 mg total) by mouth once.   No facility-administered encounter medications on file as of  07/19/2017.   :  Review of Systems:  Out of a complete 14 point review of systems, all are reviewed and negative with the exception of these symptoms as listed below:  Review of Systems  Neurological:       Pt presents today to discuss her weakness. Pt describes this as a full body weakness that has lasted over one month. Pt is also complaining of pain in her buttocks. Pt was given prednisone which helped the pain, but now that she has finished taking the prednisone, the pain is back.    Objective:  Neurological Exam  Physical Exam Physical Examination:   Vitals:   07/19/17 1001  BP: 115/77  Pulse: 87    General Examination: The patient is a very pleasant 71 y.o. female in no acute distress. She appears well-developed and well-nourished and well groomed. She is anxious appearing.  HEENT: Normocephalic, atraumatic, pupils are equal, round and reactive to light and accommodation. No ptosis. No double vision reported. She has corrective eyeglasses. Extraocular tracking is good without limitation to gaze excursion or nystagmus noted. Normal smooth pursuit is noted. Hearing is grossly intact. Face is symmetric with normal facial animation and normal facial sensation. Speech is clear with no dysarthria noted. There is no hypophonia. There is a mild neck and head tremor. She has an intermittent slight voice tremor. Neck is supple with full range of passive and active motion. There are no carotid bruits on auscultation. Oropharynx exam reveals: no abnormal findings.   Chest: Clear to auscultation without wheezing, rhonchi or crackles noted.  Heart: S1+S2+0, regular and normal without murmurs, rubs or gallops noted.   Abdomen: Soft, non-tender and non-distended with normal bowel sounds appreciated on auscultation.  Extremities: There is no pitting edema in the distal lower extremities bilaterally. Pedal pulses are intact.  Skin: Warm and dry without trophic changes noted.  Musculoskeletal:  exam reveals no obvious joint deformities, tenderness or joint swelling or erythema with the exception of surgical scars on her left foot.   Neurologically:  Mental status: The patient is awake, alert and oriented in all 4 spheres. Her immediate and remote memory, attention, language skills and fund of knowledge are appropriate. There is no evidence of aphasia, agnosia, apraxia or anomia. Speech is clear with normal prosody and enunciation. Thought process is linear. Mood is normal and affect is normal.  Cranial nerves II - XII are as described above under HEENT exam. In addition: shoulder shrug is normal with equal shoulder height noted. Motor exam: Normal bulk, strength and tone is noted. There is no drift, mild and intermittent slight resting tremor in the left upper extremity, otherwise mild postural tremor in the left more than right upper extremity with a slight action  component. Romberg is negative. Reflexes are 2+ to 3+ throughout. Babinski: Toes are flexor bilaterally. Fine motor skills and coordination: intact with normal finger taps, normal hand movements, normal rapid alternating patting, normal foot taps and normal foot agility.  Cerebellar testing: No dysmetria or intention tremor on finger to nose testing. Heel to shin is unremarkable bilaterally. There is no truncal or gait ataxia.  Sensory exam: intact to light touch, pinprick, vibration, temperature sense in the upper and lower extremities.  Gait, station and balance: She stands easily. No veering to one side is noted. No leaning to one side is noted. Posture is age-appropriate and stance is narrow based. Gait shows normal stride length and normal pace. No problems turning are noted.   Assessment and Plan:    In summary, JALEXA PIFER is a very pleasant 71 y.o.-year old female with an underlying medical history of diabetes, depression, anxiety, hyperlipidemia, kidney stone, sleep apnea and obesity,  who presents for neurologic  consultation of a one-month history of global muscle weakness reported, lack of energy. On examination, she has a nonfocal exam, no diplopia, no ptosis, no weakness noted. She has symmetrical reflexes and normal sensory exam, she has a mild tremor which is affecting her head and neck area, to a lesser degree her speech and is noticeable in both hands, family history of tremor as well. She is overall reassuring, she had extensive blood work thus far and I would for completion suggest we add myasthenia gravis panel. We can do this blood work today and call her with her test results. In addition, I would like to proceed with EMG nerve conduction testing of her left lower extremity and right upper extremity. We will call her with that test results as well. For now, we will be able to see her back as needed as she has a normal neurological exam. I answered all her questions today and she was in agreement.  Thank you very much for allowing me to participate in the care of this nice patient. If I can be of any further assistance to you please do not hesitate to call me at [phone].  Sincerely,   [name], MD, PhD

## 2017-07-24 ENCOUNTER — Telehealth: Payer: Self-pay

## 2017-07-24 NOTE — Telephone Encounter (Signed)
-----   Message from Geronimo Running, RN sent at 07/24/2017  2:10 PM EDT -----   ----- Message ----- From: Huston Foley, MD Sent: 07/24/2017   1:46 PM To: Geronimo Running, RN  Lab for myasthenia negative. One component did not come back yet, will update if needed.  Please call and advise the patient.  Also remind patient to keep any upcoming appointments or tests and to call us with any interim questions, concerns, problems or updates. EMG/NCV next week. Will call with results after that.  Thanks,  Huston Foley, MD, PhD

## 2017-07-24 NOTE — Progress Notes (Signed)
Lab for myasthenia negative. One component did not come back yet, will update if needed.  Please call and advise the patient.  Also remind patient to keep any upcoming appointments or tests and to call us with any interim questions, concerns, problems or updates. EMG/NCV next week. Will call with results after that.  Thanks,  Huston Foley, MD, PhD

## 2017-07-24 NOTE — Telephone Encounter (Signed)
I made patient aware that her lab for myasthenia is negative and that one component has not come back yet, but we will call with update if needed. She voiced understanding and will call with any questions or concerns.

## 2017-07-25 LAB — MYASTHENIA GRAVIS FULL PANEL
ACETYLCHOL BLOCK AB: 18 % (ref 0–25)
AChR Binding Ab, Serum: <0.03 nmol/L (ref 0.00–0.24)
Anti-striation Abs: NEGATIVE

## 2017-08-02 ENCOUNTER — Ambulatory Visit (INDEPENDENT_AMBULATORY_CARE_PROVIDER_SITE_OTHER): Payer: Medicare Other | Admitting: Diagnostic Neuroimaging

## 2017-08-02 ENCOUNTER — Encounter (INDEPENDENT_AMBULATORY_CARE_PROVIDER_SITE_OTHER): Payer: Self-pay | Admitting: Diagnostic Neuroimaging

## 2017-08-02 ENCOUNTER — Telehealth: Payer: Self-pay | Admitting: Neurology

## 2017-08-02 DIAGNOSIS — R251 Tremor, unspecified: Secondary | ICD-10-CM

## 2017-08-02 DIAGNOSIS — R531 Weakness: Secondary | ICD-10-CM | POA: Diagnosis not present

## 2017-08-02 DIAGNOSIS — Z0289 Encounter for other administrative examinations: Secondary | ICD-10-CM

## 2017-08-02 DIAGNOSIS — R5383 Other fatigue: Secondary | ICD-10-CM

## 2017-08-02 NOTE — Telephone Encounter (Signed)
Can you please call pt back with results of her labs she had taken. She states she received the results of only one lab but not the other. Thank you. JBA

## 2017-08-02 NOTE — Telephone Encounter (Signed)
I spoke with Dr. Frances Furbish, all of pt's lab work was normal.  I called pt and advised her of this. Pt completed her NCV/EMG and knows that we will call her with those results. Pt verbalized understanding.

## 2017-08-06 ENCOUNTER — Telehealth: Payer: Self-pay

## 2017-08-06 MED ORDER — BUPROPION HCL ER (XL) 150 MG PO TB24: 150.0000 mg | ORAL_TABLET | Freq: Every day | ORAL | 3 refills | Status: DC

## 2017-08-06 MED ORDER — CLORAZEPATE DIPOTASSIUM 3.75 MG PO TABS: 3.7500 mg | ORAL_TABLET | Freq: Two times a day (BID) | ORAL | 3 refills | Status: DC

## 2017-08-06 MED ORDER — SERTRALINE HCL 100 MG PO TABS: 100.0000 mg | ORAL_TABLET | Freq: Every day | ORAL | 3 refills | Status: DC

## 2017-08-06 MED ORDER — METFORMIN HCL 500 MG PO TABS: ORAL_TABLET | ORAL | 3 refills | Status: DC

## 2017-08-06 NOTE — Telephone Encounter (Signed)
Received fax from [facility] in regards to a refill on Metformin , clorazepate 3.75mg , sertraline , and wellbutrin xl 150 mg for patient. Medication was refilled per Dr. Beryle Quant request. Sent 1 yr

## 2017-08-06 NOTE — Procedures (Signed)
[facility]  NCS (NERVE CONDUCTION STUDY) WITH EMG (ELECTROMYOGRAPHY) REPORT   STUDY DATE: 08/02/17 PATIENT NAME: Diana George DOB: 04/07/1946 MRN: [id number]  ORDERING CLINICIAN: Huston Foley, MD PhD   TECHNOLOGIST: Charlesetta Ivory ELECTROMYOGRAPHER: Glenford Bayley. Tkai Serfass, MD  CLINICAL INFORMATION: 71 year old female with left leg and right arm weakness. History of left ankle fracture.   FINDINGS: NERVE CONDUCTION STUDY: Right median, right ulnar, bilateral peroneal and bilateral tibial motor responses are normal.  Bilateral tibial and right ulnar F wave latencies are normal.  Right sural, right median and right ulnar sensory responses are normal.  Left sural and right superficial peroneal sensory responses have slightly decreased amplitudes and normal peak latencies. Left superficial peroneal sensory response could not be obtained.   NEEDLE ELECTROMYOGRAPHY:  Needle examination of left deltoid, biceps, triceps, flexor carpi radialis, first dorsal interosseous, vastus medialis, tibials anterior and gastrocnemius is normal.    IMPRESSION:   Mildly abnormal study demonstrate: 1. 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 2. No underlying evidence of myopathy or motor neuropathy at this time.    INTERPRETING PHYSICIAN:  Suanne Marker, MD Certified in Neurology, Neurophysiology and Neuroimaging  [facility] [address] [place], [place] [address] [phone]   [facility]    Nerve / Sites Muscle Latency Ref. Amplitude Ref. Rel Amp Segments Distance Velocity Ref. Area    ms ms mV mV %  cm m/s m/s mVms  R Median - APB     Wrist APB 3.2 ?4.4 7.2 ?4.0 100 Wrist - APB 7   26.4     Upper arm APB 7.1  7.1  98.9 Upper arm - Wrist 22 56 ?49 22.0  R Ulnar - ADM     Wrist ADM 2.2 ?3.3 7.6 ?6.0 100 Wrist - ADM 7   24.5     B.Elbow ADM 5.6   6.8  89.7 B.Elbow - Wrist 19 56 ?49 22.4     A.Elbow ADM 7.3  5.9  86.2 A.Elbow - B.Elbow 10 56 ?49 21.0         A.Elbow - Wrist      L Peroneal - EDB     Ankle EDB 4.8 ?6.5 3.1 ?2.0 100 Ankle - EDB 9   12.0     Fib head EDB 10.5  2.7  88.1 Fib head - Ankle 28 49 ?44 11.0     Pop fossa EDB 12.8  2.4  87.5 Pop fossa - Fib head 10 44 ?44 9.9         Pop fossa - Ankle      R Peroneal - EDB     Ankle EDB 4.8 ?6.5 4.2 ?2.0 100 Ankle - EDB 9   13.9     Fib head EDB 10.5  3.9  94.2 Fib head - Ankle 28 49 ?44 13.9     Pop fossa EDB 12.7  3.7  95.1 Pop fossa - Fib head 10 46 ?44 13.7         Pop fossa - Ankle      L Tibial - AH     Ankle AH 4.1 ?5.8 14.1 ?4.0 100 Ankle - AH 9   39.3     Pop fossa AH 13.0  6.6  46.7 Pop fossa - Ankle 38 43 ?41 22.6  R Tibial - AH     Ankle AH 3.8 ?5.8 16.0 ?4.0 100 Ankle -  AH 9   40.9     Pop fossa AH 12.1  10.6  66.2 Pop fossa - Ankle 38 46 ?41 27.7                 SNC    Nerve / Sites Rec. Site Peak Lat Ref.  Amp Ref. Segments Distance Peak Diff Ref.    ms ms V V  cm ms ms  R Sural - Ankle (Calf)     Calf Ankle 4.2 ?4.4 7 ?6 Calf - Ankle 14    L Sural - Ankle (Calf)     Calf Ankle 4.1 ?4.4 4 ?6 Calf - Ankle 14    L Superficial peroneal - Ankle     Lat leg Ankle NR ?4.4 NR ?6 Lat leg - Ankle 14    R Superficial peroneal - Ankle     Lat leg Ankle 4.4 ?4.4 4 ?6 Lat leg - Ankle 14    R Median, Ulnar - Transcarpal comparison     Median Palm Wrist 2.1 ?2.2 40 ?35 Median Palm - Wrist 8       Ulnar Palm Wrist 2.0 ?2.2 14 ?12 Ulnar Palm - Wrist 8          Median Palm - Ulnar Palm  0.2 ?0.4  R Median - Orthodromic (Dig II, Mid palm)     Dig II Wrist 3.0 ?3.4 12 ?10 Dig II - Wrist 13    R Ulnar - Orthodromic, (Dig V, Mid palm)     Dig V Wrist 2.4 ?3.1 14 ?5 Dig V - Wrist 81                     F  Wave    Nerve F Lat Ref.   ms ms  L Tibial - AH 47.4 ?56.0  R Tibial - AH 47.4 ?56.0  R Ulnar - ADM 26.1 ?32.0               EMG full       EMG Summary  Table    Spontaneous MUAP Recruitment  Muscle IA Fib PSW Fasc Other Amp Dur. Poly Pattern  L. Deltoid Normal None None None _______ Normal Normal Normal Normal  L. Biceps brachii Normal None None None _______ Normal Normal Normal Normal  L. Triceps brachii Normal None None None _______ Normal Normal Normal Normal  L. Flexor carpi radialis Normal None None None _______ Normal Normal Normal Normal  L. First dorsal interosseous Normal None None None _______ Normal Normal Normal Normal  L. Vastus medialis Normal None None None _______ Normal Normal Normal Normal  L. Tibialis anterior Normal None None None _______ Normal Normal Normal Normal  L. Gastrocnemius (Medial head) Normal None None None _______ Normal Normal Normal Normal

## 2017-08-07 ENCOUNTER — Ambulatory Visit (INDEPENDENT_AMBULATORY_CARE_PROVIDER_SITE_OTHER): Payer: Medicare Other | Admitting: Internal Medicine

## 2017-08-07 DIAGNOSIS — Z23 Encounter for immunization: Secondary | ICD-10-CM

## 2017-08-07 NOTE — Progress Notes (Signed)
Flu vaccine given.

## 2017-08-07 NOTE — Patient Instructions (Signed)
Flu vaccine given.

## 2017-08-13 NOTE — Progress Notes (Signed)
Please call and advise the patient that the recent EMG and nerve conduction velocity test, which is the electrical nerve and muscle test we we performed, was reported as mildly abnormal, in that she has signs of mild sensory neuropathy. This is likely due to her Diabetes and ongoing good DM control is key. She had an A1c of 6.2 in May, which is good, but there is still a chance for diabetic patients to go on to develop neuropathy/nerve damage, even in the presence of seemingly good blood sugar control.  We checked for abnormal electrical discharges in the muscles or nerves and the report suggested normal findings otherwise, in that there is no muscle disease and no evidence of motor neuropathy and as such, no clear explanation for her lack of energy and her complaint of  global weakness.  No further action is required on this test at this time.  We can at this point do a follow up as needed or FU in about 6 months with me or NP, if she prefers. Thanks,  Huston Foley, MD, PhD

## 2017-08-14 ENCOUNTER — Telehealth: Payer: Self-pay

## 2017-08-14 NOTE — Telephone Encounter (Signed)
-----   Message from Huston Foley, MD sent at 08/13/2017  9:39 AM EDT ----- Please call and advise the patient that the recent EMG and nerve conduction velocity test, which is the electrical nerve and muscle test we we performed, was reported as mildly abnormal, in that she has signs of mild sensory neuropathy. This is likely due to her Diabetes and ongoing good DM control is key. She had an A1c of 6.2 in May, which is good, but there is still a chance for diabetic patients to go on to develop neuropathy/nerve damage, even in the presence of seemingly good blood sugar control.  We checked for abnormal electrical discharges in the muscles or nerves and the report suggested normal findings otherwise, in that there is no muscle disease and no evidence of motor neuropathy and as such, no clear explanation for her lack of energy and her complaint of  global weakness.  No further action is required on this test at this time.  We can at this point do a follow up as needed or FU in about 6 months with me or NP, if she prefers. Thanks,  Huston Foley, MD, PhD

## 2017-08-14 NOTE — Telephone Encounter (Signed)
I called pt. I advised her that the recent EMG and NCV was reported as mildly abnormal, pt has signs of mild sensory neuropathy which is likely due to her diabetes and ongoing good DM control is key. There still is a chance for DM pts to develop neuropathy/nerve damage, even in the presence of seemingly good blood sugar control. No evidence of muscle disease nor motor neuropathy, and no clear explanation for her lack of energy and her complaint of global weakness. No further action is required on this test at this time. I offered pt an as needed follow up with Dr. Frances Furbish or the NP, but pt states that she is feeling better, and declined an appt at this time. Pt verbalized understanding of results. Pt had no questions at this time but was encouraged to call back if questions arise.

## 2017-08-27 ENCOUNTER — Telehealth: Payer: Self-pay

## 2017-08-27 MED ORDER — CLORAZEPATE DIPOTASSIUM 3.75 MG PO TABS: 3.7500 mg | ORAL_TABLET | Freq: Two times a day (BID) | ORAL | 1 refills | Status: DC

## 2017-08-27 NOTE — Telephone Encounter (Signed)
Received fax from [facility] in regards to a refill on Clorazepate 3.75mg   for patient. Medication was refilled per Dr. Beryle QuantBaxley's request. Sent 180 with 1 refill

## 2017-10-01 ENCOUNTER — Other Ambulatory Visit: Payer: Medicare Other | Admitting: Internal Medicine

## 2017-10-03 ENCOUNTER — Other Ambulatory Visit: Payer: Self-pay | Admitting: Internal Medicine

## 2017-10-03 DIAGNOSIS — E138 Other specified diabetes mellitus with unspecified complications: Secondary | ICD-10-CM

## 2017-10-03 DIAGNOSIS — E785 Hyperlipidemia, unspecified: Secondary | ICD-10-CM

## 2017-10-03 DIAGNOSIS — Z79899 Other long term (current) drug therapy: Secondary | ICD-10-CM

## 2017-10-04 ENCOUNTER — Ambulatory Visit: Payer: Medicare Other | Admitting: Internal Medicine

## 2017-10-08 ENCOUNTER — Other Ambulatory Visit: Payer: Medicare Other | Admitting: Internal Medicine

## 2017-10-10 ENCOUNTER — Other Ambulatory Visit (INDEPENDENT_AMBULATORY_CARE_PROVIDER_SITE_OTHER): Payer: Medicare Other | Admitting: Internal Medicine

## 2017-10-10 DIAGNOSIS — E785 Hyperlipidemia, unspecified: Secondary | ICD-10-CM

## 2017-10-10 DIAGNOSIS — Z79899 Other long term (current) drug therapy: Secondary | ICD-10-CM | POA: Diagnosis not present

## 2017-10-10 DIAGNOSIS — E138 Other specified diabetes mellitus with unspecified complications: Secondary | ICD-10-CM

## 2017-10-10 NOTE — Addendum Note (Signed)
Addended by: Gregery NaVALENCIA, Gregoria Selvy P on: 10/10/2017 10:30 AM   Modules accepted: Orders

## 2017-10-11 ENCOUNTER — Ambulatory Visit (INDEPENDENT_AMBULATORY_CARE_PROVIDER_SITE_OTHER): Payer: Medicare Other | Admitting: Internal Medicine

## 2017-10-11 ENCOUNTER — Encounter: Payer: Self-pay | Admitting: Internal Medicine

## 2017-10-11 VITALS — BP 100/70 | HR 110 | Ht 66.0 in | Wt 196.5 lb

## 2017-10-11 DIAGNOSIS — E119 Type 2 diabetes mellitus without complications: Secondary | ICD-10-CM | POA: Diagnosis not present

## 2017-10-11 DIAGNOSIS — Z23 Encounter for immunization: Secondary | ICD-10-CM

## 2017-10-11 DIAGNOSIS — E782 Mixed hyperlipidemia: Secondary | ICD-10-CM

## 2017-10-11 DIAGNOSIS — F419 Anxiety disorder, unspecified: Secondary | ICD-10-CM | POA: Diagnosis not present

## 2017-10-11 DIAGNOSIS — F32A Depression, unspecified: Secondary | ICD-10-CM

## 2017-10-11 DIAGNOSIS — N183 Chronic kidney disease, stage 3 unspecified: Secondary | ICD-10-CM

## 2017-10-11 DIAGNOSIS — F329 Major depressive disorder, single episode, unspecified: Secondary | ICD-10-CM

## 2017-10-11 LAB — HEPATIC FUNCTION PANEL
AG Ratio: 1.9 (calc) (ref 1.0–2.5)
ALBUMIN MSPROF: 4.2 g/dL (ref 3.6–5.1)
ALT: 11 U/L (ref 6–29)
AST: 12 U/L (ref 10–35)
Alkaline phosphatase (APISO): 68 U/L (ref 33–130)
BILIRUBIN DIRECT: 0.1 mg/dL (ref 0.0–0.2)
BILIRUBIN INDIRECT: 0.5 mg/dL (ref 0.2–1.2)
BILIRUBIN TOTAL: 0.6 mg/dL (ref 0.2–1.2)
GLOBULIN: 2.2 g/dL (ref 1.9–3.7)
Total Protein: 6.4 g/dL (ref 6.1–8.1)

## 2017-10-11 LAB — HEMOGLOBIN A1C
Hgb A1c MFr Bld: 6.4 % of total Hgb — ABNORMAL HIGH (ref <5.7)
Mean Plasma Glucose: 137 (calc)
eAG (mmol/L): 7.6 (calc)

## 2017-10-11 LAB — LIPID PANEL
CHOL/HDL RATIO: 3.2 (calc) (ref <5.0)
Cholesterol: 172 mg/dL (ref <200)
HDL: 53 mg/dL (ref >50)
LDL CHOLESTEROL (CALC): 98 mg/dL
Non-HDL Cholesterol (Calc): 119 mg/dL (calc) (ref <130)
Triglycerides: 118 mg/dL (ref <150)

## 2017-10-11 LAB — MICROALBUMIN / CREATININE URINE RATIO
CREATININE, URINE: 178 mg/dL (ref 20–275)
MICROALB/CREAT RATIO: 12 ug/mg{creat} (ref <30)
Microalb, Ur: 2.1 mg/dL

## 2017-10-11 NOTE — Progress Notes (Signed)
   Subjective:    Patient ID: Diana George, female    DOB: Jul 21, 1946, 71 y.o.   MRN: [id number]  HPI 71 year old Female with multiple issues including anxiety depression, diabetes mellitus, hyperlipidemia, sleep apnea in today for 5745-month recheck.  In September I referred her to a neurologist complaining of lack of energy some tremulousness and weakness.  Neurology workup was negative. She was checked for Lyme disease, Epstein-Barr virus, myasthenia gravis CMV infection.   TSH was normal.  She was complaining of some musculoskeletal pain and rheumatology workup including total CK ANA sed rate rheumatoid factor aldolase and LDH were normal.  She is wondering if some of her medications could be causing the symptoms.  She is really not had a bad episode in a number of weeks.  Her hemoglobin A1c is up slightly at 6.4%.  Her lipid panel is normal and her liver functions are normal.  She denies significant stress in her life at this point in time.  However she seems a bit dysthymic. Chronic kidney disease followed by nephrology and is stable  Review of Systems see above     Objective:   Physical Exam  Neck is supple without JVD thyromegaly or carotid bruits.  Chest clear.  Cardiac exam regular rate and rhythm.  Extremities without edema.      Assessment & Plan:  Impaired glucose tolerance-up slightly at 6.4%.  Continue Glucophage and Januvia  Hyperlipidemia-lipid panel normal on statin  Anxiety and depression  Stage III chronic kidney disease stable   Plan: Consider counseling. Stop Wellbutrin and see if symptoms improve.  She will let me know in a few weeks how she is feeling.  Physical exam due in 6 months.  Pneumovax 23 given.

## 2017-10-15 DIAGNOSIS — G4733 Obstructive sleep apnea (adult) (pediatric): Secondary | ICD-10-CM | POA: Diagnosis not present

## 2017-10-29 ENCOUNTER — Encounter: Payer: Self-pay | Admitting: Internal Medicine

## 2017-10-29 NOTE — Patient Instructions (Signed)
Stop Wellbutrin.  See how you are feeling in a few weeks after the holidays.  Continue other medications as previously prescribed.  Return in 6 months for physical exam.  Pneumococcal 23 vaccine given today

## 2018-02-11 DIAGNOSIS — H52223 Regular astigmatism, bilateral: Secondary | ICD-10-CM | POA: Diagnosis not present

## 2018-02-11 LAB — HM DIABETES EYE EXAM

## 2018-02-12 ENCOUNTER — Encounter: Payer: Self-pay | Admitting: Internal Medicine

## 2018-02-25 ENCOUNTER — Other Ambulatory Visit: Payer: Self-pay | Admitting: Internal Medicine

## 2018-02-25 NOTE — Telephone Encounter (Signed)
Refill x 6 months 

## 2018-03-27 ENCOUNTER — Other Ambulatory Visit: Payer: Self-pay | Admitting: Internal Medicine

## 2018-03-27 DIAGNOSIS — E782 Mixed hyperlipidemia: Secondary | ICD-10-CM

## 2018-03-27 DIAGNOSIS — F419 Anxiety disorder, unspecified: Secondary | ICD-10-CM

## 2018-03-27 DIAGNOSIS — Z0000 Encounter for general adult medical examination without abnormal findings: Secondary | ICD-10-CM

## 2018-03-27 DIAGNOSIS — E119 Type 2 diabetes mellitus without complications: Secondary | ICD-10-CM

## 2018-03-27 DIAGNOSIS — N183 Chronic kidney disease, stage 3 unspecified: Secondary | ICD-10-CM

## 2018-03-27 DIAGNOSIS — F329 Major depressive disorder, single episode, unspecified: Secondary | ICD-10-CM

## 2018-03-27 DIAGNOSIS — F32A Depression, unspecified: Secondary | ICD-10-CM

## 2018-04-09 ENCOUNTER — Other Ambulatory Visit: Payer: Medicare Other | Admitting: Internal Medicine

## 2018-04-09 DIAGNOSIS — N183 Chronic kidney disease, stage 3 unspecified: Secondary | ICD-10-CM

## 2018-04-09 DIAGNOSIS — Z0000 Encounter for general adult medical examination without abnormal findings: Secondary | ICD-10-CM | POA: Diagnosis not present

## 2018-04-09 DIAGNOSIS — E119 Type 2 diabetes mellitus without complications: Secondary | ICD-10-CM

## 2018-04-09 DIAGNOSIS — F419 Anxiety disorder, unspecified: Secondary | ICD-10-CM

## 2018-04-09 DIAGNOSIS — F32A Depression, unspecified: Secondary | ICD-10-CM

## 2018-04-09 DIAGNOSIS — E782 Mixed hyperlipidemia: Secondary | ICD-10-CM

## 2018-04-09 DIAGNOSIS — F329 Major depressive disorder, single episode, unspecified: Secondary | ICD-10-CM

## 2018-04-10 LAB — CBC WITH DIFFERENTIAL/PLATELET
BASOS ABS: 40 {cells}/uL (ref 0–200)
Basophils Relative: 0.6 %
EOS PCT: 2 %
Eosinophils Absolute: 132 cells/uL (ref 15–500)
HCT: 37.1 % (ref 35.0–45.0)
HEMOGLOBIN: 12.8 g/dL (ref 11.7–15.5)
Lymphs Abs: 2046 cells/uL (ref 850–3900)
MCH: 31.4 pg (ref 27.0–33.0)
MCHC: 34.5 g/dL (ref 32.0–36.0)
MCV: 90.9 fL (ref 80.0–100.0)
MONOS PCT: 8.5 %
MPV: 11.4 fL (ref 7.5–12.5)
NEUTROS ABS: 3821 {cells}/uL (ref 1500–7800)
Neutrophils Relative %: 57.9 %
Platelets: 305 10*3/uL (ref 140–400)
RBC: 4.08 10*6/uL (ref 3.80–5.10)
RDW: 12.3 % (ref 11.0–15.0)
Total Lymphocyte: 31 %
WBC mixed population: 561 cells/uL (ref 200–950)
WBC: 6.6 10*3/uL (ref 3.8–10.8)

## 2018-04-10 LAB — LIPID PANEL
CHOL/HDL RATIO: 3.7 (calc) (ref <5.0)
Cholesterol: 166 mg/dL (ref <200)
HDL: 45 mg/dL — AB (ref >50)
LDL CHOLESTEROL (CALC): 101 mg/dL — AB
NON-HDL CHOLESTEROL (CALC): 121 mg/dL (ref <130)
TRIGLYCERIDES: 108 mg/dL (ref <150)

## 2018-04-10 LAB — COMPLETE METABOLIC PANEL WITH GFR
AG RATIO: 2 (calc) (ref 1.0–2.5)
ALT: 12 U/L (ref 6–29)
AST: 14 U/L (ref 10–35)
Albumin: 4.2 g/dL (ref 3.6–5.1)
Alkaline phosphatase (APISO): 70 U/L (ref 33–130)
BILIRUBIN TOTAL: 0.7 mg/dL (ref 0.2–1.2)
BUN/Creatinine Ratio: 17 (calc) (ref 6–22)
BUN: 19 mg/dL (ref 7–25)
CO2: 22 mmol/L (ref 20–32)
Calcium: 9.3 mg/dL (ref 8.6–10.4)
Chloride: 107 mmol/L (ref 98–110)
Creat: 1.09 mg/dL — ABNORMAL HIGH (ref 0.60–0.93)
GFR, EST AFRICAN AMERICAN: 59 mL/min/{1.73_m2} — AB (ref >=60)
GFR, Est Non African American: 51 mL/min/{1.73_m2} — ABNORMAL LOW (ref >=60)
GLUCOSE: 112 mg/dL — AB (ref 65–99)
Globulin: 2.1 g/dL (calc) (ref 1.9–3.7)
POTASSIUM: 4.2 mmol/L (ref 3.5–5.3)
Sodium: 141 mmol/L (ref 135–146)
TOTAL PROTEIN: 6.3 g/dL (ref 6.1–8.1)

## 2018-04-10 LAB — TSH: TSH: 2.23 mIU/L (ref 0.40–4.50)

## 2018-04-10 LAB — MICROALBUMIN / CREATININE URINE RATIO
Creatinine, Urine: 351 mg/dL — ABNORMAL HIGH (ref 20–275)
MICROALB/CREAT RATIO: 24 ug/mg{creat} (ref <30)
Microalb, Ur: 8.5 mg/dL

## 2018-04-10 LAB — HEMOGLOBIN A1C
EAG (MMOL/L): 7.4 (calc)
HEMOGLOBIN A1C: 6.3 %{Hb} — AB (ref <5.7)
MEAN PLASMA GLUCOSE: 134 (calc)

## 2018-04-11 ENCOUNTER — Encounter: Payer: Self-pay | Admitting: Internal Medicine

## 2018-04-11 ENCOUNTER — Ambulatory Visit (INDEPENDENT_AMBULATORY_CARE_PROVIDER_SITE_OTHER): Payer: Medicare Other | Admitting: Internal Medicine

## 2018-04-11 VITALS — BP 100/70 | HR 96 | Temp 98.2°F | Ht 65.0 in | Wt 189.0 lb

## 2018-04-11 DIAGNOSIS — Z87442 Personal history of urinary calculi: Secondary | ICD-10-CM

## 2018-04-11 DIAGNOSIS — N183 Chronic kidney disease, stage 3 unspecified: Secondary | ICD-10-CM

## 2018-04-11 DIAGNOSIS — F32A Depression, unspecified: Secondary | ICD-10-CM

## 2018-04-11 DIAGNOSIS — Z0000 Encounter for general adult medical examination without abnormal findings: Secondary | ICD-10-CM | POA: Diagnosis not present

## 2018-04-11 DIAGNOSIS — E119 Type 2 diabetes mellitus without complications: Secondary | ICD-10-CM

## 2018-04-11 DIAGNOSIS — G4733 Obstructive sleep apnea (adult) (pediatric): Secondary | ICD-10-CM | POA: Diagnosis not present

## 2018-04-11 DIAGNOSIS — E782 Mixed hyperlipidemia: Secondary | ICD-10-CM | POA: Diagnosis not present

## 2018-04-11 DIAGNOSIS — F419 Anxiety disorder, unspecified: Secondary | ICD-10-CM | POA: Diagnosis not present

## 2018-04-11 DIAGNOSIS — G250 Essential tremor: Secondary | ICD-10-CM

## 2018-04-11 DIAGNOSIS — K580 Irritable bowel syndrome with diarrhea: Secondary | ICD-10-CM | POA: Diagnosis not present

## 2018-04-11 DIAGNOSIS — F329 Major depressive disorder, single episode, unspecified: Secondary | ICD-10-CM

## 2018-04-11 DIAGNOSIS — R8290 Unspecified abnormal findings in urine: Secondary | ICD-10-CM

## 2018-04-11 LAB — POCT URINALYSIS DIPSTICK
Appearance: ABNORMAL
Bilirubin, UA: NEGATIVE
Glucose, UA: NEGATIVE
KETONES UA: NEGATIVE
NITRITE UA: NEGATIVE
Odor: ABNORMAL
PROTEIN UA: POSITIVE — AB
RBC UA: NEGATIVE
SPEC GRAV UA: 1.02 (ref 1.010–1.025)
Urobilinogen, UA: 0.2 E.U./dL
pH, UA: 6 (ref 5.0–8.0)

## 2018-04-11 MED ORDER — HYOSCYAMINE SULFATE SL 0.125 MG SL SUBL: 0.1250 | SUBLINGUAL_TABLET | Freq: Three times a day (TID) | SUBLINGUAL | 0 refills | Status: DC

## 2018-04-11 NOTE — Progress Notes (Signed)
Subjective:    Patient ID: Diana George, female    DOB: 04-18-1946, 72 y.o.   MRN: [id number]  HPI 72 year old Female for health maintenance exam, Medicare Wellness,and evaluation of medical issues.  She has a history of hyperlipidemia, controlled type 2 diabetes mellitus, anxiety depression.  Had lithotripsy for kidney stones right renal pelvis and right lower pole kidney 2016.  Past medical history: Bilateral tubal ligation 1980, stone basket extraction for kidney stones in the mid 1970s.  Fractured left wrist November 2007 due to a fall in addition to a right anterior cruciate ligament tear or meniscal tear.  History of cataract extraction left eye March 2011.  Normal Cardiolite study 2002.  Echocardiogram at that time showed mild prolapse of the anterior leaflet of the mitral valve.  Study was done for syncope.  She has mild mitral regurgitation.  She was seen by Dr. Sharrell Ku in 2005 for recurrent supraventricular tachycardia.  She had a catheter ablation  in September 2005.  She had  allergy evaluation 2002 with reaction to molds.  No food allergies.  Spirometry was normal.  She had reactions to tree pollens, house dust and dust mites.  She is allergic to amoxicillin-it causes a rash  Social history: She is divorced.  Resides alone.  She is an only child.  2 adult children.  Does not smoke or consume alcohol.  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  Family history: In Apr 12, 2014her father died of septicemia with history of MI, coronary artery disease and hematoma of the brain.  He was 72 years old.  In 08-12-14her mother died with history of dementia and urinary tract infection at age 21.  Patient had normal colonoscopy by Dr. Juanda Chance June 2016 with 10-year follow-up recommended.  Followed by Dr. Briant Cedar for chronic kidney disease which is stable.    Review of Systems  Constitutional: Negative.   Respiratory: Negative.   Cardiovascular:  Negative.   Gastrointestinal:       Diarrhea shortly after eating out with friends and has had episodes of incontinence.  No blood in stool.  Genitourinary: Negative.   Neurological: Negative.   All other systems reviewed and are negative.      Objective:   Physical Exam  Constitutional: She is oriented to person, place, and time. She appears well-developed and well-nourished. No distress.  HENT:  Head: Normocephalic and atraumatic.  Right Ear: External ear normal.  Left Ear: External ear normal.  Mouth/Throat: Oropharynx is clear and moist.  Eyes: Pupils are equal, round, and reactive to light. EOM are normal. Right eye exhibits no discharge. Left eye exhibits no discharge. No scleral icterus.  Neck: Neck supple. No JVD present. No thyromegaly present.  Cardiovascular: Normal rate, regular rhythm, normal heart sounds and intact distal pulses.  No murmur heard. Pulmonary/Chest: Effort normal and breath sounds normal. No stridor. No respiratory distress. She has no wheezes.  Abdominal: Soft. She exhibits no distension. There is no tenderness. There is no guarding.  Genitourinary:  Genitourinary Comments: Deferred  Musculoskeletal: She exhibits no edema.  Lymphadenopathy:    She has no cervical adenopathy.  Neurological: She is alert and oriented to person, place, and time. She displays normal reflexes. No cranial nerve deficit. Coordination normal.  Skin: Skin is warm and dry. She is not diaphoretic.  Psychiatric: She has a normal mood and affect. Her behavior is normal. Judgment and thought content normal.  Vitals reviewed.  Assessment & Plan:  Normal health maintenance exam  Type 2 diabetes mellitus-stable on current regimen of metformin and Januvia and hemoglobin A1c excellent at 6.3%  Hyperlipidemia-stable on statin  Anxiety depression treated with Wellbutrin and Tranxene as well as Zoloft  Essential  tremor currently not being treated  History of kidney  stones with no recent episodes  History of irritable bowel syndrome treated with Levsin sublingual.  Has been having some issues when she goes out to eat with friends and we are going to give this a try  Chronic kidney disease followed by Dr. Briant Cedar and is stable with creatinine stable at 1.09  Needs bone density study and mammogram for routine health maintenance.  Did not have study last year.  Was reminded to do so this year.  History of SVT status post catheter ablation.  History of sleep apnea   Plan: Return in 6 months or as needed.  Subjective:   Patient presents for Medicare Annual/Subsequent preventive examination.  Review Past Medical/Family/Social: See above   Risk Factors  Current exercise habits: Light exercise could do a bit more Dietary issues discussed: Reminded about  low-fat low carbohydrate  Cardiac risk factors: Diabetes and hyperlipidemia  Depression Screen  (Note: if answer to either of the following is "Yes", a more complete depression screening is indicated)   Over the past two weeks, have you felt down, depressed or hopeless? No  Over the past two weeks, have you felt little interest or pleasure in doing things? No Have you lost interest or pleasure in daily life? No Do you often feel hopeless? No Do you cry easily over simple problems? No   Activities of Daily Living  In your present state of health, do you have any difficulty performing the following activities?:   Driving? No  Managing money? No  Feeding yourself? No  Getting from bed to chair? No  Climbing a flight of stairs? No  Preparing food and eating?: No  Bathing or showering? No  Getting dressed: No  Getting to the toilet? No  Using the toilet:No  Moving around from place to place: No  In the past year have you fallen or had a near fall?:No  Are you sexually active? No  Do you have more than one partner? No   Hearing Difficulties: No  Do you often ask people to speak up or  repeat themselves? No  Do you experience ringing or noises in your ears? No  Do you have difficulty understanding soft or whispered voices? No  Do you feel that you have a problem with memory? No Do you often misplace items? No    Home Safety:  Do you have a smoke alarm at your residence? Yes Do you have grab bars in the bathroom?  yes Do you have throw rugs in your house?   Cognitive Testing  Alert? Yes Normal Appearance?Yes  Oriented to person? Yes Place? Yes  Time? Yes  Recall of three objects? Yes  Can perform simple calculations? Yes  Displays appropriate judgment?Yes  Can read the correct time from a watch face?Yes   List the Names of Other Physician/Practitioners you currently use:  See referral list for the physicians patient is currently seeing.    See above Review of Systems: See above   Objective:     General appearance: Appears stated age and mildly obese  Head: Normocephalic, without obvious abnormality, atraumatic  Eyes: conj clear, EOMi PEERLA  Ears: normal TM's and external ear canals both ears  Nose: Nares normal. Septum midline. Mucosa normal. No drainage or sinus tenderness.  Throat: lips, mucosa, and tongue normal; teeth and gums normal  Neck: no adenopathy, no carotid bruit, no JVD, supple, symmetrical, trachea midline and thyroid not enlarged, symmetric, no tenderness/mass/nodules  No CVA tenderness.  Lungs: clear to auscultation bilaterally  Breasts: normal appearance, no masses or tenderness, top of the pacemaker on left upper chest. Incision well-healed. It is tender.  Heart: regular rate and rhythm, S1, S2 normal, no murmur, click, rub or gallop  Abdomen: soft, non-tender; bowel sounds normal; no masses, no organomegaly  Musculoskeletal: ROM normal in all joints, no crepitus, no deformity, Normal muscle strengthen. Back  is symmetric, no curvature. Skin: Skin color, texture, turgor normal. No rashes or lesions  Lymph nodes: Cervical,  supraclavicular, and axillary nodes normal.  Neurologic: CN 2 -12 Normal, Normal symmetric reflexes. Normal coordination and gait  Psych: Alert & Oriented x 3, Mood appear stable.    Assessment:    Annual wellness medicare exam   Plan:    During the course of the visit the patient was educated and counseled about appropriate screening and preventive services including:   Annual mammogram  Annual flu vaccine  Needs to have bone density study  Reminded about diabetic eye exam     Patient Instructions (the written plan) was given to the patient.  Medicare Attestation  I have personally reviewed:  The patient's medical and social history  Their use of alcohol, tobacco or illicit drugs  Their current medications and supplements  The patient's functional ability including ADLs,fall risks, home safety risks, cognitive, and hearing and visual impairment  Diet and physical activities  Evidence for depression or mood disorders  The patient's weight, height, BMI, and visual acuity have been recorded in the chart. I have made referrals, counseling, and provided education to the patient based on review of the above and I have provided the patient with a written personalized care plan for preventive services.

## 2018-04-12 LAB — URINE CULTURE
MICRO NUMBER: 90710314
SPECIMEN QUALITY: ADEQUATE

## 2018-04-27 NOTE — Patient Instructions (Addendum)
It was a pleasure to see you today.  Medical issues seem to be stable.  Try to get more exercise.  Try Levsin for irritable bowel syndrome.  Continue other medications as previously prescribed and follow-up in 6 months.  Please have mammogram and bone density study.

## 2018-06-07 ENCOUNTER — Other Ambulatory Visit: Payer: Self-pay | Admitting: Internal Medicine

## 2018-06-25 DIAGNOSIS — Z01419 Encounter for gynecological examination (general) (routine) without abnormal findings: Secondary | ICD-10-CM | POA: Diagnosis not present

## 2018-06-25 DIAGNOSIS — Z1231 Encounter for screening mammogram for malignant neoplasm of breast: Secondary | ICD-10-CM | POA: Diagnosis not present

## 2018-06-25 DIAGNOSIS — M816 Localized osteoporosis [Lequesne]: Secondary | ICD-10-CM | POA: Diagnosis not present

## 2018-06-25 DIAGNOSIS — Z6830 Body mass index (BMI) 30.0-30.9, adult: Secondary | ICD-10-CM | POA: Diagnosis not present

## 2018-07-10 DIAGNOSIS — G4733 Obstructive sleep apnea (adult) (pediatric): Secondary | ICD-10-CM | POA: Diagnosis not present

## 2018-07-15 DIAGNOSIS — N841 Polyp of cervix uteri: Secondary | ICD-10-CM | POA: Diagnosis not present

## 2018-08-16 ENCOUNTER — Encounter: Payer: Self-pay | Admitting: Internal Medicine

## 2018-08-16 ENCOUNTER — Ambulatory Visit (INDEPENDENT_AMBULATORY_CARE_PROVIDER_SITE_OTHER): Payer: Medicare Other | Admitting: Internal Medicine

## 2018-08-16 DIAGNOSIS — Z23 Encounter for immunization: Secondary | ICD-10-CM

## 2018-08-16 NOTE — Patient Instructions (Signed)
Patient received a flu vaccine IM L deltoid, AV, CMA  

## 2018-09-18 ENCOUNTER — Other Ambulatory Visit: Payer: Self-pay | Admitting: Internal Medicine

## 2018-10-08 ENCOUNTER — Other Ambulatory Visit: Payer: Medicare Other | Admitting: Internal Medicine

## 2018-10-08 DIAGNOSIS — N183 Chronic kidney disease, stage 3 unspecified: Secondary | ICD-10-CM

## 2018-10-08 DIAGNOSIS — E119 Type 2 diabetes mellitus without complications: Secondary | ICD-10-CM

## 2018-10-08 DIAGNOSIS — E782 Mixed hyperlipidemia: Secondary | ICD-10-CM | POA: Diagnosis not present

## 2018-10-09 LAB — HEPATIC FUNCTION PANEL
AG Ratio: 1.9 (calc) (ref 1.0–2.5)
ALT: 15 U/L (ref 6–29)
AST: 17 U/L (ref 10–35)
Albumin: 4.3 g/dL (ref 3.6–5.1)
Alkaline phosphatase (APISO): 70 U/L (ref 33–130)
BILIRUBIN INDIRECT: 0.4 mg/dL (ref 0.2–1.2)
Bilirubin, Direct: 0.1 mg/dL (ref 0.0–0.2)
Globulin: 2.3 g/dL (calc) (ref 1.9–3.7)
Total Bilirubin: 0.5 mg/dL (ref 0.2–1.2)
Total Protein: 6.6 g/dL (ref 6.1–8.1)

## 2018-10-09 LAB — LIPID PANEL
Cholesterol: 179 mg/dL (ref <200)
HDL: 55 mg/dL (ref >50)
LDL Cholesterol (Calc): 105 mg/dL (calc) — ABNORMAL HIGH
Non-HDL Cholesterol (Calc): 124 mg/dL (calc) (ref <130)
Total CHOL/HDL Ratio: 3.3 (calc) (ref <5.0)
Triglycerides: 92 mg/dL (ref <150)

## 2018-10-09 LAB — MICROALBUMIN / CREATININE URINE RATIO
Creatinine, Urine: 168 mg/dL (ref 20–275)
MICROALB/CREAT RATIO: 6 ug/mg{creat} (ref <30)
Microalb, Ur: 1 mg/dL

## 2018-10-09 LAB — HEMOGLOBIN A1C
HEMOGLOBIN A1C: 6.3 %{Hb} — AB (ref <5.7)
Mean Plasma Glucose: 134 (calc)
eAG (mmol/L): 7.4 (calc)

## 2018-10-10 ENCOUNTER — Ambulatory Visit (INDEPENDENT_AMBULATORY_CARE_PROVIDER_SITE_OTHER): Payer: Medicare Other | Admitting: Internal Medicine

## 2018-10-10 ENCOUNTER — Encounter: Payer: Self-pay | Admitting: Internal Medicine

## 2018-10-10 VITALS — BP 102/70 | HR 74 | Temp 98.2°F | Ht 65.0 in | Wt 188.0 lb

## 2018-10-10 DIAGNOSIS — F329 Major depressive disorder, single episode, unspecified: Secondary | ICD-10-CM

## 2018-10-10 DIAGNOSIS — E782 Mixed hyperlipidemia: Secondary | ICD-10-CM | POA: Diagnosis not present

## 2018-10-10 DIAGNOSIS — N183 Chronic kidney disease, stage 3 unspecified: Secondary | ICD-10-CM

## 2018-10-10 DIAGNOSIS — F419 Anxiety disorder, unspecified: Secondary | ICD-10-CM | POA: Diagnosis not present

## 2018-10-10 DIAGNOSIS — E119 Type 2 diabetes mellitus without complications: Secondary | ICD-10-CM

## 2018-10-10 DIAGNOSIS — F32A Depression, unspecified: Secondary | ICD-10-CM

## 2018-10-10 DIAGNOSIS — K580 Irritable bowel syndrome with diarrhea: Secondary | ICD-10-CM

## 2018-10-10 NOTE — Progress Notes (Signed)
   Subjective:    Patient ID: Diana GullySharon E Hammer, female    DOB: 10-Aug-1946, 72 y.o.   MRN: [id number]  HPI 72 year old Female for 6 month follow up of medical issues.  Feels well.  No new complaints.  Lipids under excellent control.  Total cholesterol is 179, LDL cholesterol 195, triglycerides 92.  Hemoglobin A1c 6.3% and stable.    Review of Systems see above     Objective:   Physical Exam Blood pressure 102/70; weight 188 pounds.  BMI 31.28 Neck is supple without JVD thyromegaly or carotid bruits.  Chest clear to auscultation without rales or wheezing.  Cardiac exam regular rate and rhythm normal S1 and S2 without rubs   gallops.  Extremities without edema.         Assessment & Plan:  Controlled type 2 diabetes mellitus-stable on Januvia and metformin  Hyperlipidemia-stable on Zocor  History of depression-stable on Wellbutrin and Zoloft  History of anxiety-stable on Tranxene  Irritable bowel syndrome treated with Levsin sublingual as needed  Chronic kidney disease followed by nephrology and creatinine stable at 1.09    Plan: Continue to encourage diet exercise and weight loss.  Follow-up for physical exam in 6 months.  No change in medication regimen.

## 2018-10-27 NOTE — Patient Instructions (Signed)
It was a pleasure to see you today.  Watch diet and exercise.  No change in medication regimen.  Follow-up with physical exam in 6 months.

## 2018-12-28 ENCOUNTER — Other Ambulatory Visit: Payer: Self-pay | Admitting: Internal Medicine

## 2019-01-06 DIAGNOSIS — D225 Melanocytic nevi of trunk: Secondary | ICD-10-CM | POA: Diagnosis not present

## 2019-01-06 DIAGNOSIS — Z23 Encounter for immunization: Secondary | ICD-10-CM | POA: Diagnosis not present

## 2019-01-06 DIAGNOSIS — L814 Other melanin hyperpigmentation: Secondary | ICD-10-CM | POA: Diagnosis not present

## 2019-01-06 DIAGNOSIS — L821 Other seborrheic keratosis: Secondary | ICD-10-CM | POA: Diagnosis not present

## 2019-03-06 ENCOUNTER — Other Ambulatory Visit: Payer: Self-pay | Admitting: Internal Medicine

## 2019-04-01 ENCOUNTER — Other Ambulatory Visit: Payer: Medicare Other | Admitting: Internal Medicine

## 2019-04-07 ENCOUNTER — Encounter: Payer: Medicare Other | Admitting: Internal Medicine

## 2019-05-06 ENCOUNTER — Other Ambulatory Visit: Payer: Self-pay

## 2019-05-06 ENCOUNTER — Other Ambulatory Visit: Payer: Medicare Other | Admitting: Internal Medicine

## 2019-05-06 DIAGNOSIS — K580 Irritable bowel syndrome with diarrhea: Secondary | ICD-10-CM

## 2019-05-06 DIAGNOSIS — E782 Mixed hyperlipidemia: Secondary | ICD-10-CM | POA: Diagnosis not present

## 2019-05-06 DIAGNOSIS — Z0000 Encounter for general adult medical examination without abnormal findings: Secondary | ICD-10-CM

## 2019-05-06 DIAGNOSIS — F419 Anxiety disorder, unspecified: Secondary | ICD-10-CM

## 2019-05-06 DIAGNOSIS — E119 Type 2 diabetes mellitus without complications: Secondary | ICD-10-CM | POA: Diagnosis not present

## 2019-05-06 DIAGNOSIS — F329 Major depressive disorder, single episode, unspecified: Secondary | ICD-10-CM

## 2019-05-06 DIAGNOSIS — N183 Chronic kidney disease, stage 3 unspecified: Secondary | ICD-10-CM

## 2019-05-06 DIAGNOSIS — F32A Depression, unspecified: Secondary | ICD-10-CM

## 2019-05-07 LAB — LIPID PANEL
Cholesterol: 147 mg/dL (ref <200)
HDL: 52 mg/dL (ref >=50)
LDL Cholesterol (Calc): 75 mg/dL (calc)
Non-HDL Cholesterol (Calc): 95 mg/dL (calc) (ref <130)
Total CHOL/HDL Ratio: 2.8 (calc) (ref <5.0)
Triglycerides: 113 mg/dL (ref <150)

## 2019-05-07 LAB — CBC WITH DIFFERENTIAL/PLATELET
Absolute Monocytes: 482 cells/uL (ref 200–950)
Basophils Absolute: 60 cells/uL (ref 0–200)
Basophils Relative: 0.9 %
Eosinophils Absolute: 268 cells/uL (ref 15–500)
Eosinophils Relative: 4 %
HCT: 38.7 % (ref 35.0–45.0)
Hemoglobin: 13.1 g/dL (ref 11.7–15.5)
Lymphs Abs: 2050 cells/uL (ref 850–3900)
MCH: 31.6 pg (ref 27.0–33.0)
MCHC: 33.9 g/dL (ref 32.0–36.0)
MCV: 93.5 fL (ref 80.0–100.0)
MPV: 11.3 fL (ref 7.5–12.5)
Monocytes Relative: 7.2 %
Neutro Abs: 3839 cells/uL (ref 1500–7800)
Neutrophils Relative %: 57.3 %
Platelets: 340 10*3/uL (ref 140–400)
RBC: 4.14 10*6/uL (ref 3.80–5.10)
RDW: 12.3 % (ref 11.0–15.0)
Total Lymphocyte: 30.6 %
WBC: 6.7 10*3/uL (ref 3.8–10.8)

## 2019-05-07 LAB — COMPLETE METABOLIC PANEL WITH GFR
AG Ratio: 1.9 (calc) (ref 1.0–2.5)
ALT: 19 U/L (ref 6–29)
AST: 21 U/L (ref 10–35)
Albumin: 4.1 g/dL (ref 3.6–5.1)
Alkaline phosphatase (APISO): 60 U/L (ref 37–153)
BUN/Creatinine Ratio: 18 (calc) (ref 6–22)
BUN: 20 mg/dL (ref 7–25)
CO2: 25 mmol/L (ref 20–32)
Calcium: 9.2 mg/dL (ref 8.6–10.4)
Chloride: 107 mmol/L (ref 98–110)
Creat: 1.12 mg/dL — ABNORMAL HIGH (ref 0.60–0.93)
GFR, Est African American: 57 mL/min/{1.73_m2} — ABNORMAL LOW (ref >=60)
GFR, Est Non African American: 49 mL/min/{1.73_m2} — ABNORMAL LOW (ref >=60)
Globulin: 2.2 g/dL (calc) (ref 1.9–3.7)
Glucose, Bld: 145 mg/dL — ABNORMAL HIGH (ref 65–99)
Potassium: 4.5 mmol/L (ref 3.5–5.3)
Sodium: 140 mmol/L (ref 135–146)
Total Bilirubin: 0.5 mg/dL (ref 0.2–1.2)
Total Protein: 6.3 g/dL (ref 6.1–8.1)

## 2019-05-07 LAB — TSH: TSH: 2.37 mIU/L (ref 0.40–4.50)

## 2019-05-07 LAB — MICROALBUMIN / CREATININE URINE RATIO
Creatinine, Urine: 259 mg/dL (ref 20–275)
Microalb Creat Ratio: 8 mcg/mg creat (ref <30)
Microalb, Ur: 2 mg/dL

## 2019-05-07 LAB — HEMOGLOBIN A1C
Hgb A1c MFr Bld: 7 % of total Hgb — ABNORMAL HIGH (ref <5.7)
Mean Plasma Glucose: 154 (calc)
eAG (mmol/L): 8.5 (calc)

## 2019-05-08 DIAGNOSIS — G4733 Obstructive sleep apnea (adult) (pediatric): Secondary | ICD-10-CM | POA: Diagnosis not present

## 2019-05-13 ENCOUNTER — Encounter: Payer: Self-pay | Admitting: Internal Medicine

## 2019-05-13 ENCOUNTER — Other Ambulatory Visit: Payer: Self-pay

## 2019-05-13 ENCOUNTER — Ambulatory Visit (INDEPENDENT_AMBULATORY_CARE_PROVIDER_SITE_OTHER): Payer: Medicare Other | Admitting: Internal Medicine

## 2019-05-13 VITALS — BP 100/70 | HR 92 | Ht 65.0 in | Wt 196.0 lb

## 2019-05-13 DIAGNOSIS — Z0000 Encounter for general adult medical examination without abnormal findings: Secondary | ICD-10-CM

## 2019-05-13 DIAGNOSIS — N183 Chronic kidney disease, stage 3 unspecified: Secondary | ICD-10-CM

## 2019-05-13 DIAGNOSIS — F419 Anxiety disorder, unspecified: Secondary | ICD-10-CM

## 2019-05-13 DIAGNOSIS — G250 Essential tremor: Secondary | ICD-10-CM

## 2019-05-13 DIAGNOSIS — F329 Major depressive disorder, single episode, unspecified: Secondary | ICD-10-CM

## 2019-05-13 DIAGNOSIS — F32A Depression, unspecified: Secondary | ICD-10-CM

## 2019-05-13 DIAGNOSIS — K580 Irritable bowel syndrome with diarrhea: Secondary | ICD-10-CM

## 2019-05-13 DIAGNOSIS — E782 Mixed hyperlipidemia: Secondary | ICD-10-CM | POA: Diagnosis not present

## 2019-05-13 DIAGNOSIS — H9313 Tinnitus, bilateral: Secondary | ICD-10-CM

## 2019-05-13 DIAGNOSIS — E119 Type 2 diabetes mellitus without complications: Secondary | ICD-10-CM

## 2019-05-13 DIAGNOSIS — G4733 Obstructive sleep apnea (adult) (pediatric): Secondary | ICD-10-CM | POA: Diagnosis not present

## 2019-05-13 DIAGNOSIS — R8290 Unspecified abnormal findings in urine: Secondary | ICD-10-CM

## 2019-05-13 DIAGNOSIS — Z87442 Personal history of urinary calculi: Secondary | ICD-10-CM

## 2019-05-13 LAB — POCT URINALYSIS DIPSTICK
Bilirubin, UA: NEGATIVE
Blood, UA: NEGATIVE
Glucose, UA: NEGATIVE
Ketones, UA: NEGATIVE
Nitrite, UA: NEGATIVE
Protein, UA: POSITIVE — AB
Spec Grav, UA: 1.01 (ref 1.010–1.025)
Urobilinogen, UA: 0.2 E.U./dL
pH, UA: 5 (ref 5.0–8.0)

## 2019-05-13 NOTE — Progress Notes (Signed)
Subjective:    Patient ID: Diana George, female    DOB: [birth year], 73 y.o.   MRN: [id number]  HPI 73 year old Female in today for health maintenance exam, annual Medicare wellness exam and evaluation of medical issues.  Admits to being tired of the pandemic and not having as much freedom that she did prior to the pandemic.  May be depressed due to that.  Past medical history: History of kidney stones involving right kidney requiring lithotripsy in 2016.  Bilateral tubal ligation 1980.  Stone basket extraction for kidney stones in the mid 1970s.  Fractured left wrist December 2007 due to a fall in addition to right anterior cruciate ligament tear or meniscal tear.  Cataract extraction left eye March 2011.  Normal Cardiolite study in 2002.  Echocardiogram at the time showed mild prolapse of the anterior leaflet of the mitral valve.  Study was done for syncope.  She had mild mitral regurgitation.  Saw Dr. Sharrell KuGreg Taylor in 2005 for recurrent supraventricular tachycardia.  She had a catheter ablation in September 2005.  Had allergy evaluation in 2002 with reaction to molds.  No food allergies.  Spirometry was normal.  She had reactions to tree pollens house dust and dust mites.  She is allergic to amoxicillin.  It causes a rash.  Social history: She is divorced.  Resides alone.  She is an only child.  2 adult children.  Does not smoke or consume alcohol.  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  Family history: April 2014 her father died of septicemia with history of MI, coronary artery disease and hematoma of the brain.  He was 73 years old.  In August 2014 her mother died with history of dementia and urinary tract infection at age 73.  Patient had normal colonoscopy June 2016 with 10-year follow-up recommended.  [facility] for chronic kidney disease which is stable.        Review of Systems  Constitutional: Positive for fatigue.    Respiratory: Negative.   Cardiovascular: Negative.   Gastrointestinal:       Episodes of diarrhea when eating out and has had episodes of incontinence of stool.  Genitourinary:       Chronic kidney disease followed by nephrology  Musculoskeletal: Negative.   Neurological: Negative.   Psychiatric/Behavioral:       Anxiety and depression   Has gained 8 pounds since Dec. 2019. Discussed trip to beach with family and Covid score risk of complications.  Not exercising.    Objective:   Physical Exam Vitals signs reviewed.  Constitutional:      General: She is not in acute distress.    Appearance: Normal appearance. She is not toxic-appearing.  HENT:     Head: Normocephalic and atraumatic.     Right Ear: Tympanic membrane normal.     Left Ear: Tympanic membrane normal.     Nose: Nose normal.     Mouth/Throat:     Mouth: Mucous membranes are moist.     Pharynx: Oropharynx is clear.  Eyes:     General: No scleral icterus.       Right eye: No discharge.        Left eye: No discharge.     Extraocular Movements: Extraocular movements intact.     Conjunctiva/sclera: Conjunctivae normal.     Pupils: Pupils are equal, round, and reactive to light.  Neck:     Musculoskeletal: Neck supple. No neck rigidity.  Cardiovascular:     Rate and Rhythm: Normal rate and regular rhythm.     Heart sounds: Normal heart sounds. No murmur.  Pulmonary:     Effort: Pulmonary effort is normal. No respiratory distress.     Breath sounds: Normal breath sounds. No wheezing.  Abdominal:     General: Bowel sounds are normal. There is no distension.     Palpations: Abdomen is soft. There is no mass.     Tenderness: There is no abdominal tenderness. There is no guarding or rebound.  Genitourinary:    Comments: Bimanual normal Musculoskeletal:     Right lower leg: No edema.     Left lower leg: No edema.  Lymphadenopathy:     Cervical: No cervical adenopathy.  Skin:    General: Skin is warm and dry.   Neurological:     General: No focal deficit present.     Mental Status: She is alert and oriented to person, place, and time.     Cranial Nerves: No cranial nerve deficit.     Gait: Gait normal.  Psychiatric:        Thought Content: Thought content normal.        Judgment: Judgment normal.     Comments: Dysthymic           Assessment & Plan:  Dysthymia/depression tired of pandemic  Controlled type 2 diabetes mellitus stable on current regimen of metformin and Januvia.  Needs to diet exercise and lose weight.  Has gained 8 pounds.  Hemoglobin A1c 7% and was 6.3% December 2019.  Hyperlipidemia-stable on statin with normal lipids  Anxiety depression treated with Wellbutrin Zoloft and Tranxene  Essential tremor not being treated  History of kidney stones with no recent episodes  History of irritable bowel syndrome -D treated with Levsin sublingual  Chronic kidney disease followed by nephrologist in stable at 1.12  History of SVT status post catheter ablation  History of sleep apnea  Plan: Encourage diet exercise and weight loss.  Take Levsin before going out to eat to see if that will help with diarrhea related to irritable bowel symptoms.  Continue current antianxiety/antidepressant medication.  Follow-up in 6 months.  Subjective:   Patient presents for Medicare Annual/Subsequent preventive examination.  Review Past Medical/Family/Social: See above   Risk Factors  Current exercise habits: Could do more exercise Dietary issues discussed: Has gained 8 pounds needs to watch diet  Cardiac risk factors: Diabetes and hyperlipidemia  Depression Screen  (Note: if answer to either of the following is "Yes", a more complete depression screening is indicated)   Over the past two weeks, have you felt down, depressed or hopeless? No -just frustrated with the pandemic Over the past two weeks, have you felt little interest or pleasure in doing things? No Have you lost interest  or pleasure in daily life? No-just frustrated with the pandemic Do you often feel hopeless? No Do you cry easily over simple problems? No   Activities of Daily Living  In your present state of health, do you have any difficulty performing the following activities?:   Driving? No  Managing money? No  Feeding yourself? No  Getting from bed to chair? No  Climbing a flight of stairs? No  Preparing food and eating?: No  Bathing or showering? No  Getting dressed: No  Getting to the toilet? No  Using the toilet:No  Moving around from place to place: No  In the past year have you fallen or had a near  fall?:No  Are you sexually active? No  Do you have more than one partner? No   Hearing Difficulties: No  Do you often ask people to speak up or repeat themselves? No  Do you experience ringing or noises in your ears? No  Do you have difficulty understanding soft or whispered voices? No  Do you feel that you have a problem with memory? No Do you often misplace items? No    Home Safety:  Do you have a smoke alarm at your residence? Yes Do you have grab bars in the bathroom?yes Do you have throw rugs in your house? yes   Cognitive Testing  Alert? Yes Normal Appearance?Yes  Oriented to person? Yes Place? Yes  Time? Yes  Recall of three objects? Yes  Can perform simple calculations? Yes  Displays appropriate judgment?Yes  Can read the correct time from a watch face?Yes   List the Names of Other Physician/Practitioners you currently use:  See referral list for the physicians patient is currently seeing.  [facility]   Review of Systems: See above   Objective:     General appearance: Appears stated age and mildly obese  Head: Normocephalic, without obvious abnormality, atraumatic  Eyes: conj clear, EOMi PEERLA  Ears: normal TM's and external ear canals both ears  Nose: Nares normal. Septum midline. Mucosa normal. No drainage or sinus tenderness.  Throat: lips,  mucosa, and tongue normal; teeth and gums normal  Neck: no adenopathy, no carotid bruit, no JVD, supple, symmetrical, trachea midline and thyroid not enlarged, symmetric, no tenderness/mass/nodules  No CVA tenderness.  Lungs: clear to auscultation bilaterally  Breasts: normal appearance and without masses Heart: regular rate and rhythm, S1, S2 normal, no murmur, click, rub or gallop  Abdomen: soft, non-tender; bowel sounds normal; no masses, no organomegaly  Musculoskeletal: ROM normal in all joints, no crepitus, no deformity, Normal muscle strengthen. Back  is symmetric, no curvature. Skin: Skin color, texture, turgor normal. No rashes or lesions  Lymph nodes: Cervical, supraclavicular, and axillary nodes normal.  Neurologic: CN 2 -12 Normal, Normal symmetric reflexes. Normal coordination and gait  Psych: Alert & Oriented x 3, Mood appear stable.    Assessment:    Annual wellness medicare exam   Plan:    During the course of the visit the patient was educated and counseled about appropriate screening and preventive services including:   Annual mammogram  Annual flu vaccine     Patient Instructions (the written plan) was given to the patient.  Medicare Attestation  I have personally reviewed:  The patient's medical and social history  Their use of alcohol, tobacco or illicit drugs  Their current medications and supplements  The patient's functional ability including ADLs,fall risks, home safety risks, cognitive, and hearing and visual impairment  Diet and physical activities  Evidence for depression or mood disorders  The patient's weight, height, BMI, and visual acuity have been recorded in the chart. I have made referrals, counseling, and provided education to the patient based on review of the above and I have provided the patient with a written personalized care plan for preventive services.

## 2019-05-15 LAB — URINE CULTURE
MICRO NUMBER:: 665048
SPECIMEN QUALITY:: ADEQUATE

## 2019-05-25 NOTE — Patient Instructions (Signed)
Please work on diet exercise and weight loss.  Continue current medications.  Try Levsin before going out to eat with friends to prevent diarrhea.  Return in 6 months.

## 2019-06-30 ENCOUNTER — Other Ambulatory Visit: Payer: Self-pay | Admitting: Internal Medicine

## 2019-06-30 NOTE — Telephone Encounter (Signed)
Refill x 6 months 

## 2019-07-09 ENCOUNTER — Other Ambulatory Visit: Payer: Self-pay

## 2019-07-09 ENCOUNTER — Ambulatory Visit (INDEPENDENT_AMBULATORY_CARE_PROVIDER_SITE_OTHER): Payer: Medicare Other | Admitting: Internal Medicine

## 2019-07-09 ENCOUNTER — Encounter: Payer: Self-pay | Admitting: Internal Medicine

## 2019-07-09 DIAGNOSIS — Z23 Encounter for immunization: Secondary | ICD-10-CM

## 2019-07-09 NOTE — Progress Notes (Signed)
Flu vaccine given by CMA 

## 2019-07-09 NOTE — Patient Instructions (Signed)
Patient received a flu vaccine IM L deltoid, AV, CMA  

## 2019-07-16 ENCOUNTER — Encounter: Payer: Self-pay | Admitting: Internal Medicine

## 2019-07-16 DIAGNOSIS — H524 Presbyopia: Secondary | ICD-10-CM | POA: Diagnosis not present

## 2019-07-16 LAB — HM DIABETES EYE EXAM

## 2019-09-11 DIAGNOSIS — Z01419 Encounter for gynecological examination (general) (routine) without abnormal findings: Secondary | ICD-10-CM | POA: Diagnosis not present

## 2019-09-11 DIAGNOSIS — Z6831 Body mass index (BMI) 31.0-31.9, adult: Secondary | ICD-10-CM | POA: Diagnosis not present

## 2019-09-11 DIAGNOSIS — Z1231 Encounter for screening mammogram for malignant neoplasm of breast: Secondary | ICD-10-CM | POA: Diagnosis not present

## 2019-10-09 ENCOUNTER — Telehealth: Payer: Self-pay | Admitting: Internal Medicine

## 2019-10-09 MED ORDER — CLORAZEPATE DIPOTASSIUM 3.75 MG PO TABS: ORAL_TABLET | ORAL | 0 refills | Status: DC

## 2019-10-09 NOTE — Telephone Encounter (Signed)
Received Fax RX request from  [place] [place] Phone:  [phone]  Fax:  [phone]     Medication - clorazepate (TRANXENE) 3.75 MG tablet    Last Refill -   Last OV - 05/13/19  Last CPE - 7/14//20  Next Appointment - 11/13/19

## 2019-10-13 ENCOUNTER — Other Ambulatory Visit: Payer: Self-pay

## 2019-10-13 MED ORDER — CLORAZEPATE DIPOTASSIUM 3.75 MG PO TABS: ORAL_TABLET | ORAL | 0 refills | Status: DC

## 2019-11-11 ENCOUNTER — Other Ambulatory Visit: Payer: Self-pay

## 2019-11-11 ENCOUNTER — Other Ambulatory Visit: Payer: Medicare Other | Admitting: Internal Medicine

## 2019-11-11 DIAGNOSIS — E782 Mixed hyperlipidemia: Secondary | ICD-10-CM | POA: Diagnosis not present

## 2019-11-11 DIAGNOSIS — E119 Type 2 diabetes mellitus without complications: Secondary | ICD-10-CM

## 2019-11-12 LAB — LIPID PANEL
Cholesterol: 151 mg/dL (ref <200)
HDL: 54 mg/dL (ref >=50)
LDL Cholesterol (Calc): 78 mg/dL (calc)
Non-HDL Cholesterol (Calc): 97 mg/dL (calc) (ref <130)
Total CHOL/HDL Ratio: 2.8 (calc) (ref <5.0)
Triglycerides: 108 mg/dL (ref <150)

## 2019-11-12 LAB — HEPATIC FUNCTION PANEL
AG Ratio: 2.1 (calc) (ref 1.0–2.5)
ALT: 24 U/L (ref 6–29)
AST: 20 U/L (ref 10–35)
Albumin: 4.5 g/dL (ref 3.6–5.1)
Alkaline phosphatase (APISO): 65 U/L (ref 37–153)
Bilirubin, Direct: 0.1 mg/dL (ref 0.0–0.2)
Globulin: 2.1 g/dL (calc) (ref 1.9–3.7)
Indirect Bilirubin: 0.5 mg/dL (calc) (ref 0.2–1.2)
Total Bilirubin: 0.6 mg/dL (ref 0.2–1.2)
Total Protein: 6.6 g/dL (ref 6.1–8.1)

## 2019-11-12 LAB — HEMOGLOBIN A1C
Hgb A1c MFr Bld: 7 % of total Hgb — ABNORMAL HIGH (ref <5.7)
Mean Plasma Glucose: 154 (calc)
eAG (mmol/L): 8.5 (calc)

## 2019-11-13 ENCOUNTER — Encounter: Payer: Self-pay | Admitting: Internal Medicine

## 2019-11-13 ENCOUNTER — Ambulatory Visit (INDEPENDENT_AMBULATORY_CARE_PROVIDER_SITE_OTHER): Payer: Medicare Other | Admitting: Internal Medicine

## 2019-11-13 ENCOUNTER — Other Ambulatory Visit: Payer: Self-pay

## 2019-11-13 VITALS — BP 110/70 | HR 104 | Temp 98.0°F | Ht 65.0 in | Wt 195.0 lb

## 2019-11-13 DIAGNOSIS — E782 Mixed hyperlipidemia: Secondary | ICD-10-CM | POA: Diagnosis not present

## 2019-11-13 DIAGNOSIS — N1831 Chronic kidney disease, stage 3a: Secondary | ICD-10-CM | POA: Diagnosis not present

## 2019-11-13 DIAGNOSIS — G250 Essential tremor: Secondary | ICD-10-CM

## 2019-11-13 DIAGNOSIS — G4733 Obstructive sleep apnea (adult) (pediatric): Secondary | ICD-10-CM | POA: Diagnosis not present

## 2019-11-13 DIAGNOSIS — Z87442 Personal history of urinary calculi: Secondary | ICD-10-CM

## 2019-11-13 DIAGNOSIS — F329 Major depressive disorder, single episode, unspecified: Secondary | ICD-10-CM

## 2019-11-13 DIAGNOSIS — F419 Anxiety disorder, unspecified: Secondary | ICD-10-CM

## 2019-11-13 DIAGNOSIS — Z6832 Body mass index (BMI) 32.0-32.9, adult: Secondary | ICD-10-CM

## 2019-11-13 DIAGNOSIS — K580 Irritable bowel syndrome with diarrhea: Secondary | ICD-10-CM

## 2019-11-13 DIAGNOSIS — F32A Depression, unspecified: Secondary | ICD-10-CM

## 2019-11-13 DIAGNOSIS — E119 Type 2 diabetes mellitus without complications: Secondary | ICD-10-CM

## 2019-11-13 NOTE — Progress Notes (Signed)
   Subjective:    Patient ID: Diana George, female    DOB: 17-Mar-1946, 74 y.o.   MRN: [id number]  HPI  74 year old Female for 6 month recheck and follow up on multiple medical issues.    Has history of controlled type 2 diabetes mellitus treated with Metformin and Januvia.    History of anxiety and depression treated with Wellbutrin Zoloft and Tranxene.    History of kidney stones.  No recent episodes.  History of essential tremor not being treated   History of chronic kidney disease followed by nephrologist.    History of irritable bowel syndrome-D treated with sublingual Levsin.  History of SVT status post catheter ablation.    Review of Systems no new complaints     Objective:   Physical Exam Blood pressure 110/70, pulse 104 BMI 32.45 pulse oximetry 97% weight 195 pounds.  She needs to lose some weight Diet exercise discussed.  Skin warm and dry.  Nodes none.  Neck is supple without JVD thyromegaly or carotid bruits.  Chest clear to auscultation.  Cardiac exam regular rate and rhythm normal S1 and S2.  Extremities without edema.  Affect judgment and thought process normal. Hemoglobin A1c is 7%.  Would like to see control a bit better.  Lipid panel and liver functions within normal limits.     Assessment & Plan:  Controlled type 2 diabetes mellitus-would like control to be less than 7 as she has done previously  BMI 32-would like patient to lose some weight.  Needs to diet and exercise  Chronic kidney disease-stable followed by [facility].  This was not checked via labs today  Anxiety and depression-stable on current regimen Wellbutrin, Tranxene and Zoloft  Hyperlipidemia-lipid panel normal on Zocor-  Plan: Continue current regimen.  Work hard on diet exercise and weight loss and follow-up in 6 months for Medicare wellness and annual physical exam with fasting labs.  Have COVID-19 vaccine when available.

## 2019-11-21 ENCOUNTER — Ambulatory Visit: Payer: Medicare Other | Attending: Internal Medicine

## 2019-11-21 DIAGNOSIS — Z23 Encounter for immunization: Secondary | ICD-10-CM

## 2019-11-21 NOTE — Progress Notes (Signed)
   Covid-19 Vaccination Clinic  Name:  Diana George    MRN: [id number] DOB: 01-22-1946  11/21/2019  Diana George was observed post Covid-19 immunization for 15 minutes without incidence. She was provided with Vaccine Information Sheet and instruction to access the V-Safe system.   Diana George was instructed to call 911 with any severe reactions post vaccine: Marland Kitchen Difficulty breathing  . Swelling of your face and throat  . A fast heartbeat  . A bad rash all over your body  . Dizziness and weakness    Immunizations Administered    Name Date Dose VIS Date Route   Pfizer COVID-19 Vaccine 11/21/2019  2:20 PM 0.3 mL 10/10/2019 Intramuscular   Manufacturer: [employer], [employer]   Lot: [id number]   NDC: [address]-4

## 2019-12-12 ENCOUNTER — Ambulatory Visit: Payer: Medicare Other | Attending: Internal Medicine

## 2019-12-12 DIAGNOSIS — Z23 Encounter for immunization: Secondary | ICD-10-CM | POA: Insufficient documentation

## 2019-12-12 NOTE — Progress Notes (Signed)
   Covid-19 Vaccination Clinic  Name:  Diana George    MRN: [id number] DOB: April 30, 1946  12/12/2019  [name] was observed post Covid-19 immunization for 15 minutes without incidence. She was provided with Vaccine Information Sheet and instruction to access the V-Safe system.   [name] was instructed to call 911 with any severe reactions post vaccine: Marland Kitchen Difficulty breathing  . Swelling of your face and throat  . A fast heartbeat  . A bad rash all over your body  . Dizziness and weakness    Immunizations Administered    Name Date Dose VIS Date Route   Pfizer COVID-19 Vaccine 12/12/2019 11:05 AM 0.3 mL 10/10/2019 Intramuscular   Manufacturer: [employer], [employer]   Lot: [id number]   NDC: [address]-0

## 2019-12-21 NOTE — Patient Instructions (Signed)
It was a pleasure to see you today.  Please work diligently on diet exercise and weight loss.  Continue current medications and follow-up in July 2021.

## 2020-02-03 ENCOUNTER — Telehealth: Payer: Self-pay | Admitting: Internal Medicine

## 2020-02-03 ENCOUNTER — Other Ambulatory Visit: Payer: Self-pay

## 2020-02-03 MED ORDER — CLORAZEPATE DIPOTASSIUM 3.75 MG PO TABS: ORAL_TABLET | ORAL | 1 refills | Status: DC

## 2020-02-03 MED ORDER — BUPROPION HCL ER (XL) 150 MG PO TB24: 150.0000 mg | ORAL_TABLET | Freq: Every day | ORAL | 1 refills | Status: DC

## 2020-02-03 MED ORDER — METFORMIN HCL 500 MG PO TABS: 500.0000 mg | ORAL_TABLET | Freq: Two times a day (BID) | ORAL | 1 refills | Status: DC

## 2020-02-03 MED ORDER — SIMVASTATIN 20 MG PO TABS: 20.0000 mg | ORAL_TABLET | Freq: Every day | ORAL | 1 refills | Status: DC

## 2020-02-03 MED ORDER — SITAGLIPTIN PHOSPHATE 100 MG PO TABS: 100.0000 mg | ORAL_TABLET | Freq: Every day | ORAL | 1 refills | Status: DC

## 2020-02-03 NOTE — Telephone Encounter (Signed)
Received Fax RX request from  Pharmacy -   [facility]) [facility] - [place], [place] - [address] Phone:  [phone]  Fax:  [phone]       Medication -  JANUVIA 100 MG tablet simvastatin (ZOCOR) 20 MG tablet metFORMIN (GLUCOPHAGE) 500 MG tablet clorazepate (TRANXENE) 3.75 MG tablet buPROPion (WELLBUTRIN XL) 150 MG 24 hr tablet  90 Day supply  Last Refill -  Last OV - 11/13/19  Last CPE - 05/13/19  Next Appointment -

## 2020-02-05 ENCOUNTER — Other Ambulatory Visit: Payer: Self-pay | Admitting: Internal Medicine

## 2020-02-05 ENCOUNTER — Ambulatory Visit (INDEPENDENT_AMBULATORY_CARE_PROVIDER_SITE_OTHER): Payer: Medicare Other | Admitting: Internal Medicine

## 2020-02-05 ENCOUNTER — Other Ambulatory Visit: Payer: Self-pay

## 2020-02-05 ENCOUNTER — Encounter: Payer: Self-pay | Admitting: Internal Medicine

## 2020-02-05 ENCOUNTER — Ambulatory Visit
Admission: RE | Admit: 2020-02-05 | Discharge: 2020-02-05 | Disposition: A | Discharge status: Home / Self Care | Payer: Medicare Other | Source: Ambulatory Visit | Attending: Internal Medicine | Admitting: Internal Medicine

## 2020-02-05 VITALS — BP 130/80 | HR 88 | Temp 98.7°F | Ht 65.0 in | Wt 195.0 lb

## 2020-02-05 DIAGNOSIS — M546 Pain in thoracic spine: Secondary | ICD-10-CM

## 2020-02-05 DIAGNOSIS — S299XXA Unspecified injury of thorax, initial encounter: Secondary | ICD-10-CM | POA: Diagnosis not present

## 2020-02-05 DIAGNOSIS — F329 Major depressive disorder, single episode, unspecified: Secondary | ICD-10-CM

## 2020-02-05 DIAGNOSIS — F32A Depression, unspecified: Secondary | ICD-10-CM

## 2020-02-05 DIAGNOSIS — J90 Pleural effusion, not elsewhere classified: Secondary | ICD-10-CM | POA: Diagnosis not present

## 2020-02-05 DIAGNOSIS — F419 Anxiety disorder, unspecified: Secondary | ICD-10-CM

## 2020-02-05 DIAGNOSIS — W19XXXA Unspecified fall, initial encounter: Secondary | ICD-10-CM

## 2020-02-05 DIAGNOSIS — M7918 Myalgia, other site: Secondary | ICD-10-CM | POA: Diagnosis not present

## 2020-02-05 DIAGNOSIS — R0781 Pleurodynia: Secondary | ICD-10-CM | POA: Diagnosis not present

## 2020-02-05 DIAGNOSIS — Y92009 Unspecified place in unspecified non-institutional (private) residence as the place of occurrence of the external cause: Secondary | ICD-10-CM

## 2020-02-05 DIAGNOSIS — E119 Type 2 diabetes mellitus without complications: Secondary | ICD-10-CM

## 2020-02-05 MED ORDER — HYDROCODONE-ACETAMINOPHEN 5-325 MG PO TABS: 1.0000 | ORAL_TABLET | Freq: Three times a day (TID) | ORAL | 0 refills | Status: DC | PRN

## 2020-02-05 MED ORDER — MELOXICAM 15 MG PO TABS: 15.0000 mg | ORAL_TABLET | Freq: Every day | ORAL | 0 refills | Status: DC

## 2020-02-05 NOTE — Progress Notes (Signed)
   Subjective:    Patient ID: Diana George, female    DOB: [birth year], 74 y.o.   MRN: [id number]  HPI 74 year old Female who had a fall recently at her home.  She had no loss of consciousness.  She struck her right rib cage and right mid back area.  She says she hit very hard when she fell.  Complaining of pain right lateral rib cage and right mid to lower back.  She has a history of hypertension and hyperlipidemia.  She has a history of anxiety and depression.  She has a history of diabetes mellitus treated with oral agents.  She takes Tranxene as needed for anxiety along with Wellbutrin and Zoloft.  She is taking these medications for many years.  Diabetes is under good control.  In January her hemoglobin A1c was 7.  Lipid panel was normal in January.  She resides alone.    Review of Systems see above     Objective:   Physical Exam Blood pressure 130/80 pulse 88 temperature 98.7 degrees orally pulse oximetry 95% weight 195 pounds.  She is extremely tender in her right lateral rib cage area and there is a slight contusion in that area.  She is also tender in her mid posterior right back.       Assessment & Plan:  Musculoskeletal pain right thoracic back and right lateral rib cage area secondary to a fall.  Rule out fractured rib.  Plan: She will take Norco 5/325 sparingly for pain.  Also given meloxicam 15 mg daily to take with food.  This will also help with pain and inflammation.  Addendum: No evidence of fractured ribs.  There is a small right pleural effusion likely secondary from trauma with her fall.  I would like for her to follow-up here in 4 weeks with repeat chest x-ray.

## 2020-02-05 NOTE — Patient Instructions (Addendum)
Have x-rays of the right rib area and thoracic spine today.  Mobic 15 mg daily with a meal.  Norco 5/325 sparingly every 8 hours with food as needed for severe pain.  Addendum: For small right pleural effusion.  Telephoned patient with results and she is to follow-up here in 4 weeks.  Will need chest x-ray at that time.

## 2020-02-21 ENCOUNTER — Encounter: Payer: Self-pay | Admitting: Internal Medicine

## 2020-03-04 ENCOUNTER — Ambulatory Visit
Admission: RE | Admit: 2020-03-04 | Discharge: 2020-03-04 | Disposition: A | Discharge status: Home / Self Care | Payer: Medicare Other | Source: Ambulatory Visit | Attending: Internal Medicine | Admitting: Internal Medicine

## 2020-03-04 ENCOUNTER — Ambulatory Visit (INDEPENDENT_AMBULATORY_CARE_PROVIDER_SITE_OTHER): Payer: Medicare Other | Admitting: Internal Medicine

## 2020-03-04 ENCOUNTER — Other Ambulatory Visit: Payer: Self-pay

## 2020-03-04 ENCOUNTER — Encounter: Payer: Self-pay | Admitting: Internal Medicine

## 2020-03-04 VITALS — BP 110/70 | HR 78 | Temp 98.1°F | Ht 65.0 in | Wt 195.0 lb

## 2020-03-04 DIAGNOSIS — J90 Pleural effusion, not elsewhere classified: Secondary | ICD-10-CM | POA: Diagnosis not present

## 2020-03-04 NOTE — Progress Notes (Signed)
   Subjective:    Patient ID: Diana George, female    DOB: 10/05/1946, 74 y.o.   MRN: [id number]  HPI  74 year old Female for follow up on fall at home. Accident happened early May.  She lost her balance and fell at home up against baseboard.  She  had considerable pain with this fall.  Recent chest x-ray ordered to evaluate for fractured rib on April 8 showed sliding hiatal hernia and small right pleural effusion.  It was felt the pleural effusion was due to trauma to the chest wall.  She is here today to follow-up on another x-ray will be ordered to see if this effusion has cleared.  She is taking it easy over the past few weeks.  Still has right shoulder pain.  Will be referred to physical therapy.  Accident occurred early April.    Review of Systems see above-no shortness of breath and chest pain has improved but still has shoulder issues     Objective:   Physical Exam  Blood pressure 110/70 pulse 78 temperature 98.1 pulse oximetry 97% weight 195 pounds height 5 feet 5 inches  Decreased range of motion right upper extremity due to pain.  Right chest wall has no deformity or contusion present.  Her chest is clear to auscultation without rales or wheezing.  Repeat chest x-ray shows resolution of right pleural effusion      Assessment & Plan:  Patient is developing decreased range of motion in right upper extremity consistent with impingement syndrome.  Will be referred to physical therapy.  Was initially treated with hydrocodone APAP and meloxicam.  Meloxicam can be continued.  Patient has history of type 2 diabetes mellitus which is well controlled on oral medications  History of depression treated with Zoloft and Wellbutrin  History of anxiety treated with Tranxene as well as antidepressants  Plan: Order written for physical therapy.  Keep appointment here for physical exam and fasting labs in July.

## 2020-03-10 ENCOUNTER — Other Ambulatory Visit: Payer: Self-pay

## 2020-03-11 ENCOUNTER — Other Ambulatory Visit: Payer: Self-pay

## 2020-03-11 ENCOUNTER — Ambulatory Visit: Payer: Medicare Other | Attending: Internal Medicine | Admitting: Physical Therapy

## 2020-03-11 DIAGNOSIS — M25611 Stiffness of right shoulder, not elsewhere classified: Secondary | ICD-10-CM

## 2020-03-11 DIAGNOSIS — R29898 Other symptoms and signs involving the musculoskeletal system: Secondary | ICD-10-CM | POA: Diagnosis not present

## 2020-03-11 DIAGNOSIS — M25511 Pain in right shoulder: Secondary | ICD-10-CM | POA: Diagnosis not present

## 2020-03-11 DIAGNOSIS — R293 Abnormal posture: Secondary | ICD-10-CM | POA: Diagnosis not present

## 2020-03-11 DIAGNOSIS — M6281 Muscle weakness (generalized): Secondary | ICD-10-CM

## 2020-03-11 NOTE — Patient Instructions (Signed)
    Home exercise program created by Dyson Sevey, PT.  For questions, please contact Florita Nitsch via phone at [phone] or email at [email or web address]  [facility] [place] [address]  [address] [place], [place], [address] Phone: [phone]   Fax:  [phone]    

## 2020-03-11 NOTE — Therapy (Signed)
[facility] [address]  [address] [place], [place], [address] Phone: [phone]   Fax:  [phone]  Physical Therapy Evaluation  Patient Details  Name: Diana George MRN: [id number] Date of Birth: Mar 11, 1946 Referring Provider (PT): Sharlet Salina, MD   Encounter Date: 03/11/2020  PT End of Session - 03/11/20 1448    Visit Number  1    Number of Visits  16    Date for PT Re-Evaluation  05/06/20    Authorization Type  BCBS Medicare    PT Start Time  1448    PT Stop Time  1542    PT Time Calculation (min)  54 min    Activity Tolerance  Patient tolerated treatment well    Behavior During Therapy  [facility] for tasks assessed/performed       Past Medical History:  Diagnosis Date  . Anxiety   . Depression   . Diabetes mellitus   . History of coronary rotational ablation 2006  . Hyperlipidemia   . Renal stone   . Sleep apnea    uses cpap    Past Surgical History:  Procedure Laterality Date  . CATARACT EXTRACTION W/ INTRAOCULAR LENS  IMPLANT, BILATERAL  2010  . heart ablation  2010  . LEG SURGERY Left 2011   fracture with hardware  . STONE EXTRACTION WITH BASKET  2016, 1977  . TUBAL LIGATION  1977    There were no vitals filed for this visit.   Subjective Assessment - 03/11/20 1451    Subjective  March 27 - turned quickly while cleaning out the closet and tripped over something and fell on R side. Cleared from any fractures of shoulder or ribs. Initially noted sensation of 2 tight bands around her upper arm, but now continues to have R shoulder pain and popping with limited ROM.    Diagnostic tests  X-ray 02/05/20: No evidence of displaced rib fracture or pneumothorax. Right calcific supraspinatus tendinosis.    Patient Stated Goals  "R arm to stop hurting and be stronger"    Currently in Pain?  No/denies    Pain Score  0-No pain   4/10 on average   Pain Location  Shoulder    Pain Orientation  Right    Pain  Descriptors / Indicators  Tingling    Pain Type  Acute pain    Pain Radiating Towards  R upper arm to elbow    Pain Onset  More than a month ago   01/24/20   Pain Frequency  Intermittent    Aggravating Factors   variable - driving. lifting, sleeping, reaching away from body    Pain Relieving Factors  Meloxicam    Effect of Pain on Daily Activities  vacuum with L hand, careful with lifting with R hand         [facility] PT Assessment - 03/11/20 1448      Assessment   Medical Diagnosis  R shoulder pain    Referring Provider (PT)  Sharlet Salina, MD    Onset Date/Surgical Date  01/24/20    Hand Dominance  Right    Next MD Visit  PRN for shoulder, annual physical 05/18/20    Prior Therapy  h/o PT for L wrist fx, L LE & ankle fx, torn R meniscus      Precautions   Precautions  Fall      Restrictions   Weight Bearing Restrictions  No  Balance Screen   Has the patient fallen in the past 6 months  Yes    How many times?  1 - at time of injury    Has the patient had a decrease in activity level because of a fear of falling?   Yes    Is the patient reluctant to leave their home because of a fear of falling?   No      Home Environment   Living Environment  Private residence    Living Arrangements  Alone    Type of [place]  Level entry    [place]  Two level;Able to live on main level with bedroom/bathroom   bonus room upstairs     Prior Function   Level of Independence  Independent    Vocation  Retired    Leisure  time with family (children & grandkids), active with church, crocheting, no regular exercises      Cognition   Overall Cognitive Status  Within Functional Limits for tasks assessed      Observation/Other Assessments   Focus on Therapeutic Outcomes (FOTO)   Shoulder - 59% (41% limitaton); Predicted 65% (35% limitation)      Posture/Postural Control   Posture/Postural Control  Postural limitations    Postural Limitations  Rounded  Shoulders    Posture Comments  R shoulder depressed with protracted scapula      ROM / Strength   AROM / PROM / Strength  AROM;PROM;Strength      AROM   AROM Assessment Site  Shoulder    Right/Left Shoulder  Right;Left    Right Shoulder Flexion  130 Degrees   painful above ~90 dg   Right Shoulder ABduction  99 Degrees   painful above ~70 dg   Right Shoulder Internal Rotation  70 Degrees   FER to L1 - painful   Right Shoulder External Rotation  62 Degrees   FER to T2 - painful   Left Shoulder Flexion  155 Degrees    Left Shoulder ABduction  140 Degrees    Left Shoulder Internal Rotation  --   FIR to T7   Left Shoulder External Rotation  --   FER to T4     PROM   PROM Assessment Site  Shoulder    Right/Left Shoulder  Right    Right Shoulder Flexion  148 Degrees    Right Shoulder ABduction  156 Degrees      Strength   Strength Assessment Site  Shoulder    Right/Left Shoulder  Right;Left    Right Shoulder Flexion  3-/5    Right Shoulder ABduction  3-/5    Right Shoulder Internal Rotation  3-/5    Right Shoulder External Rotation  3-/5    Left Shoulder Flexion  5/5    Left Shoulder ABduction  5/5    Left Shoulder Internal Rotation  5/5    Left Shoulder External Rotation  4+/5      Palpation   Palpation comment  ttp with increased muscle tension in R UT, LS, supraspinatus and pecs, and to a lesser degree in infraspinatus and teres group                  Objective measurements completed on examination: See above findings.      [facility] Adult PT Treatment/Exercise - 03/11/20 1448      Exercises   Exercises  Shoulder      Shoulder Exercises: Seated  Retraction  Both;10 reps;Strengthening    Retraction Limitations  scap retraction with slight depression      Shoulder Exercises: Lawyer  30 seconds;1 rep    Corner Stretch Limitations  low doorway stretch    Other Shoulder Stretches  R UT stretch 2 x 30 sec    Other Shoulder Stretches  R  LS stretch 2 x 30 sec             PT Education - 03/11/20 1540    Education Details  PT eval findings, anticipated POC and initial HEP    Person(s) Educated  Patient    Methods  Explanation;Demonstration;Verbal cues;Tactile cues;Handout    Comprehension  Verbalized understanding;Returned demonstration;Verbal cues required;Tactile cues required;Need further instruction       PT Short Term Goals - 03/11/20 1542      PT SHORT TERM GOAL #1   Title  Patient will be independent with initial HEP    Status  New    Target Date  03/25/20        PT Long Term Goals - 03/11/20 1542      PT LONG TERM GOAL #1   Title  Patient will be independent with ongoing/advanced HEP    Status  New    Target Date  05/06/20      PT LONG TERM GOAL #2   Title  Patient to demonstrate ability to achieve and maintain good R shoulder alignment/posturing to improve GHJ mechancis    Status  New    Target Date  05/06/20      PT LONG TERM GOAL #3   Title  Patient to improve R shoulder AROM to [facility] without pain provocation    Status  New    Target Date  05/06/20      PT LONG TERM GOAL #4   Title  Patient will demonstrate improved R shoulder strength to >/= 4/5 for functional UE use    Status  New    Target Date  05/06/20      PT LONG TERM GOAL #5   Title  Patient to report ability to perform ADLs and daily household chores and activities without R shoulder pain provocation    Status  New    Target Date  05/06/20             Plan - 03/11/20 1542    Clinical Impression Statement  Diana George is a 74 y/o R hand dominant female who presents to OP PT for R shoulder pain originating from a fall on 01/24/20 when she tripped over something while turning out of her closet landing on her R side. X-rays negative for fractures but ongoing R shoulder pain and loss of motion and strength persist as well as some mild lower rib pain on R. R shoulder depressed and slight protracted with increased muscle tension and  ttp evident throughout R shoulder complex, most prominent in R UT, LS, supraspinatus and pecs. R shoulder AROM limited with pain through upper ROM, although PROM nearly full/equivalent to L AROM. Severe R UE weakness present with overall strength only 3-/5 due to pain and limited ROM. Pain, LOM and weakness create difficulty reaching overhead or behind her back, prevents her from lifting or carrying things with her R arm and causes difficulty with ADLs and household chores causing her to have to use her LE UE for most tasks including vacuuming. Diana George will benefit from skilled PT to address above deficits and restore functional ROM and  strength with decreased pain to increase tolerance for ADLs and daily activities.    Personal Factors and Comorbidities  Comorbidity 3+;Fitness;Time since onset of injury/illness/exacerbation    Comorbidities  DM, OSA, anxiety and depression, h/o R meniscal tear, L wrist fx, L tibia/ankle fx    Examination-Activity Limitations  Bathing;Bed Mobility;Caring for Others;Carry;Lift;Dressing;Hygiene/Grooming;Reach Overhead    Examination-Participation Restrictions  Church;Cleaning;Community Activity;Driving;Laundry;Meal Prep;Shop;Yard Work    Stability/Clinical Decision Making  Evolving/Moderate complexity    Clinical Decision Making  Moderate    Rehab Potential  Good    PT Frequency  2x / week   hopefully tapering to 1x/wk   PT Duration  8 weeks    PT Treatment/Interventions  ADLs/Self Care Home Management;Cryotherapy;Electrical Stimulation;Iontophoresis 4mg /ml Dexamethasone;Moist Heat;Ultrasound;Functional mobility training;Therapeutic activities;Therapeutic exercise;Neuromuscular re-education;Patient/family education;Manual techniques;Passive range of motion;Dry needling;Taping;Vasopneumatic Device;Joint Manipulations    PT Next Visit Plan  Review initial HEP; manual therapy to address increased muscle tension/ttp; introduce pain free AAROM; modalities PRN for pain    PT  Home Exercise Plan  5/13 - UT, LS and low doorway pec stretches, scap retraction    Consulted and Agree with Plan of Care  Patient       Patient will benefit from skilled therapeutic intervention in order to improve the following deficits and impairments:  Decreased activity tolerance, Decreased mobility, Decreased range of motion, Decreased strength, Increased fascial restricitons, Increased muscle spasms, Impaired perceived functional ability, Impaired flexibility, Impaired UE functional use, Postural dysfunction, Improper body mechanics, Pain  Visit Diagnosis: Acute pain of right shoulder  Stiffness of right shoulder, not elsewhere classified  Muscle weakness (generalized)  Abnormal posture  Other symptoms and signs involving the musculoskeletal system     Problem List Patient Active Problem List   Diagnosis Date Noted  . OSA (obstructive sleep apnea) 04/13/2014  . Allergic rhinitis 09/29/2012  . Situational stress 09/29/2012  . Hyperlipidemia 06/16/2011  . Diabetes mellitus (HCC) 06/16/2011  . Anxiety and depression 06/16/2011    06/18/2011, PT, MPT 03/11/2020, 7:43 PM  [facility] [address]  [address] [place], [place], [place] Phone: [phone]   Fax:  [phone]  Name: Diana George MRN: Ihor Gully Date of Birth: September 27, 1946

## 2020-03-15 ENCOUNTER — Other Ambulatory Visit: Payer: Self-pay

## 2020-03-15 ENCOUNTER — Ambulatory Visit: Payer: Medicare Other | Admitting: Physical Therapy

## 2020-03-15 ENCOUNTER — Encounter: Payer: Self-pay | Admitting: Physical Therapy

## 2020-03-15 DIAGNOSIS — M25611 Stiffness of right shoulder, not elsewhere classified: Secondary | ICD-10-CM

## 2020-03-15 DIAGNOSIS — R293 Abnormal posture: Secondary | ICD-10-CM | POA: Diagnosis not present

## 2020-03-15 DIAGNOSIS — R29898 Other symptoms and signs involving the musculoskeletal system: Secondary | ICD-10-CM

## 2020-03-15 DIAGNOSIS — M25511 Pain in right shoulder: Secondary | ICD-10-CM

## 2020-03-15 DIAGNOSIS — M6281 Muscle weakness (generalized): Secondary | ICD-10-CM

## 2020-03-15 NOTE — Therapy (Signed)
[facility] [address]  [address] [place], [place], [address] Phone: [phone]   Fax:  [phone]  Physical Therapy Treatment  Patient Details  Name: Diana George MRN: [id number] Date of Birth: 03/27/1946 Referring Provider (PT): Sharlet Salina, MD   Encounter Date: 03/15/2020  PT End of Session - 03/15/20 1444    Visit Number  2    Number of Visits  16    Date for PT Re-Evaluation  05/06/20    Authorization Type  BCBS Medicare    PT Start Time  1444    PT Stop Time  1534    PT Time Calculation (min)  50 min    Activity Tolerance  Patient tolerated treatment well    Behavior During Therapy  [facility] for tasks assessed/performed       Past Medical History:  Diagnosis Date  . Anxiety   . Depression   . Diabetes mellitus   . History of coronary rotational ablation 2006  . Hyperlipidemia   . Renal stone   . Sleep apnea    uses cpap    Past Surgical History:  Procedure Laterality Date  . CATARACT EXTRACTION W/ INTRAOCULAR LENS  IMPLANT, BILATERAL  2010  . heart ablation  2010  . LEG SURGERY Left 2011   fracture with hardware  . STONE EXTRACTION WITH BASKET  2016, 1977  . TUBAL LIGATION  1977    There were no vitals filed for this visit.  Subjective Assessment - 03/15/20 1445    Subjective  Pt notes some popping and cracking today but no pain. It bothers her when washing or drying her hair.    Diagnostic tests  X-ray 02/05/20: No evidence of displaced rib fracture or pneumothorax. Right calcific supraspinatus tendinosis.    Patient Stated Goals  "R arm to stop hurting and be stronger"    Currently in Pain?  No/denies    Pain Onset  More than a month ago   01/24/20                       [facility] Adult PT Treatment/Exercise - 03/15/20 1444      Exercises   Exercises  Shoulder      Shoulder Exercises: Supine   External Rotation  Right;AAROM;10 reps    External Rotation Limitations   wand/cane - PT guiding direction of motion    Flexion  Right;AAROM;10 reps   2 sets   Flexion Limitations  wand/cane      Shoulder Exercises: Seated   Retraction  Both;10 reps;Strengthening    Retraction Limitations  scap retraction with slight depression    Flexion  Right;AAROM;5 reps    Flexion Limitations  discontinued d/t increased pain      Shoulder Exercises: Pulleys   Flexion  2 minutes    Flexion Limitations  pt reports painfree    Scaption  2 minutes    Scaption Limitations  pt cautioned to avoid pushing into painful ROM      Shoulder Exercises: ROM/Strengthening   Pendulum  R shoulder fwd/back - pt having difficulty relaxing R UE to use body momentum to create pendulum      Shoulder Exercises: Lawyer  30 seconds;1 rep    Corner Stretch Limitations  low doorway stretch - cues to keep hands low on doorframe - pt reporting mild discomfort    Other Shoulder Stretches  R UT stretch 2 x 30 sec  Other Shoulder Stretches  R LS stretch 2 x 30 sec      Manual Therapy   Manual Therapy  Joint mobilization;Soft tissue mobilization;Myofascial release    Manual therapy comments  supine    Joint Mobilization  gentle grade II distraction for pain relief    Soft tissue mobilization  STM & gentle DTM to R pecs, UT, LS, and posterior shoulder complex    Myofascial Release  gentle manual TPR to R UT, LS and teres group - pt reporting significant decrease in pain/ttp               PT Short Term Goals - 03/15/20 1447      PT SHORT TERM GOAL #1   Title  Patient will be independent with initial HEP    Status  On-going    Target Date  03/25/20        PT Long Term Goals - 03/15/20 1447      PT LONG TERM GOAL #1   Title  Patient will be independent with ongoing/advanced HEP    Status  On-going    Target Date  05/06/20      PT LONG TERM GOAL #2   Title  Patient to demonstrate ability to achieve and maintain good R shoulder alignment/posturing to improve  GHJ mechancis    Status  On-going    Target Date  05/06/20      PT LONG TERM GOAL #3   Title  Patient to improve R shoulder AROM to [facility] without pain provocation    Status  On-going    Target Date  05/06/20      PT LONG TERM GOAL #4   Title  Patient will demonstrate improved R shoulder strength to >/= 4/5 for functional UE use    Status  On-going    Target Date  05/06/20      PT LONG TERM GOAL #5   Title  Patient to report ability to perform ADLs and daily household chores and activities without R shoulder pain provocation    Status  On-going    Target Date  05/06/20            Plan - 03/15/20 1448    Clinical Impression Statement  Diana George notes increased pain initially following eval visit but feels HEP has been helping. Review of HEP requiring correction of technique with all stretches however pt able to perform good return demonstration of scapular retraction. Continued increased muscle tension/TPs identified in R UT, LS and teres group as well as pecs to a lesser degree. Good relief noted with manual STM/DTM and MFR with ttp nearly resolved. Pt able to perform flexion pulleys with good ROM w/o increased pain but discomfort noted with scaption motion. Difficulty coordinating movement w/o pain with table slides and wand AAROM, although wand flexion better tolerated with manual scapular retraction & depression by PT. Pt also having difficulty relaxing shoulder enough to use momentum with pendulum exercises. Given difficulty with above exercises, deferred update/addition to HEP. Pt declining need for modalities at end of session, but aware of use of heat or ice at home if soreness arises.    Personal Factors and Comorbidities  Comorbidity 3+;Fitness;Time since onset of injury/illness/exacerbation    Comorbidities  DM, OSA, anxiety and depression, h/o R meniscal tear, L wrist fx, L tibia/ankle fx    Examination-Activity Limitations  Bathing;Bed Mobility;Caring for  Others;Carry;Lift;Dressing;Hygiene/Grooming;Reach Overhead    Examination-Participation Restrictions  Church;Cleaning;Community Activity;Driving;Laundry;Meal Prep;Shop;Yard Work    Water engineer  Good    PT Frequency  2x / week   hopefully tapering to 1x/wk   PT Duration  8 weeks    PT Treatment/Interventions  ADLs/Self Care Home Management;Cryotherapy;Electrical Stimulation;Iontophoresis 4mg /ml Dexamethasone;Moist Heat;Ultrasound;Functional mobility training;Therapeutic activities;Therapeutic exercise;Neuromuscular re-education;Patient/family education;Manual techniques;Passive range of motion;Dry needling;Taping;Vasopneumatic Device;Joint Manipulations    PT Next Visit Plan  Review initial HEP as needed per pt rerquest; manual therapy to address increased muscle tension/ttp; continue pain free AAROM, updating HEP as appropriate; modalities PRN for pain    PT Home Exercise Plan  5/13 - UT, LS and low doorway pec stretches, scap retraction    Consulted and Agree with Plan of Care  Patient       Patient will benefit from skilled therapeutic intervention in order to improve the following deficits and impairments:  Decreased activity tolerance, Decreased mobility, Decreased range of motion, Decreased strength, Increased fascial restricitons, Increased muscle spasms, Impaired perceived functional ability, Impaired flexibility, Impaired UE functional use, Postural dysfunction, Improper body mechanics, Pain  Visit Diagnosis: Acute pain of right shoulder  Stiffness of right shoulder, not elsewhere classified  Muscle weakness (generalized)  Abnormal posture  Other symptoms and signs involving the musculoskeletal system     Problem List Patient Active Problem List   Diagnosis Date Noted  . OSA (obstructive sleep apnea) 04/13/2014  . Allergic rhinitis 09/29/2012  . Situational stress 09/29/2012  . Hyperlipidemia 06/16/2011  . Diabetes mellitus (HCC) 06/16/2011  . Anxiety and depression  06/16/2011    06/18/2011, PT, MPT 03/15/2020, 4:02 PM  [facility] [address]  [address] [place], [place], [place] Phone: [phone]   Fax:  [phone]  Name: Diana George MRN: Ihor Gully Date of Birth: [birth year]

## 2020-03-18 ENCOUNTER — Other Ambulatory Visit: Payer: Self-pay

## 2020-03-18 ENCOUNTER — Ambulatory Visit: Payer: Medicare Other

## 2020-03-18 DIAGNOSIS — M25511 Pain in right shoulder: Secondary | ICD-10-CM | POA: Diagnosis not present

## 2020-03-18 DIAGNOSIS — M25611 Stiffness of right shoulder, not elsewhere classified: Secondary | ICD-10-CM

## 2020-03-18 DIAGNOSIS — M6281 Muscle weakness (generalized): Secondary | ICD-10-CM | POA: Diagnosis not present

## 2020-03-18 DIAGNOSIS — R29898 Other symptoms and signs involving the musculoskeletal system: Secondary | ICD-10-CM | POA: Diagnosis not present

## 2020-03-18 DIAGNOSIS — R293 Abnormal posture: Secondary | ICD-10-CM

## 2020-03-18 NOTE — Therapy (Signed)
[facility] [address]  [address] [place], [place], [address] Phone: [phone]   Fax:  [phone]  Physical Therapy Treatment  Patient Details  Name: Diana George MRN: [id number] Date of Birth: 11-03-1945 Referring Provider (PT): Sharlet Salina, MD   Encounter Date: 03/18/2020  PT End of Session - 03/18/20 1459    Visit Number  3    Number of Visits  16    Date for PT Re-Evaluation  05/06/20    Authorization Type  BCBS Medicare    PT Start Time  1448    PT Stop Time  1530    PT Time Calculation (min)  42 min    Activity Tolerance  Patient tolerated treatment well    Behavior During Therapy  [facility] for tasks assessed/performed       Past Medical History:  Diagnosis Date  . Anxiety   . Depression   . Diabetes mellitus   . History of coronary rotational ablation 2006  . Hyperlipidemia   . Renal stone   . Sleep apnea    uses cpap    Past Surgical History:  Procedure Laterality Date  . CATARACT EXTRACTION W/ INTRAOCULAR LENS  IMPLANT, BILATERAL  2010  . heart ablation  2010  . LEG SURGERY Left 2011   fracture with hardware  . STONE EXTRACTION WITH BASKET  2016, 1977  . TUBAL LIGATION  1977    There were no vitals filed for this visit.  Subjective Assessment - 03/18/20 1644    Subjective  Notes some improved pain since performing HEP.    Diagnostic tests  X-ray 02/05/20: No evidence of displaced rib fracture or pneumothorax. Right calcific supraspinatus tendinosis.    Patient Stated Goals  "R arm to stop hurting and be stronger"    Currently in Pain?  No/denies    Pain Score  0-No pain    Pain Location  Shoulder    Pain Orientation  Right                        [facility] Adult PT Treatment/Exercise - 03/18/20 0001      Neck Exercises: Seated   Neck Retraction  10 reps;5 secs    Neck Retraction Limitations  chin tuck     Money  10 reps;3 secs    Money Limitations  Seated       Shoulder Exercises: Seated   Retraction  Both;20 reps    Retraction Limitations  Manual overpressure into scapular retraction following manual therapy for improved awareness of scapular retraction/depression       Shoulder Exercises: Pulleys   Flexion  3 minutes    Scaption  3 minutes      Shoulder Exercises: Stretch   Corner Stretch  30 seconds;1 rep    Corner Stretch Limitations  Single arm mid pec stretch on teh doorway     Other Shoulder Stretches  R UT stretch 2 x 30 sec    Other Shoulder Stretches  R LS stretch 2 x 30 sec      Manual Therapy   Manual Therapy  Myofascial release;Soft tissue mobilization    Manual therapy comments  seated     Soft tissue mobilization  STM to B UT, LS R>L R posterior shoulder, R lateral biceps - TPs in R lateral biceps     Myofascial Release  TPR to R UT  PT Education - 03/18/20 1642    Education Details  HEP update; single arm low/mid chest stretch on doorframe    Person(s) Educated  Patient    Methods  Explanation;Demonstration;Verbal cues;Handout    Comprehension  Verbalized understanding;Returned demonstration;Verbal cues required       PT Short Term Goals - 03/15/20 1447      PT SHORT TERM GOAL #1   Title  Patient will be independent with initial HEP    Status  On-going    Target Date  03/25/20        PT Long Term Goals - 03/15/20 1447      PT LONG TERM GOAL #1   Title  Patient will be independent with ongoing/advanced HEP    Status  On-going    Target Date  05/06/20      PT LONG TERM GOAL #2   Title  Patient to demonstrate ability to achieve and maintain good R shoulder alignment/posturing to improve GHJ mechancis    Status  On-going    Target Date  05/06/20      PT LONG TERM GOAL #3   Title  Patient to improve R shoulder AROM to [facility] without pain provocation    Status  On-going    Target Date  05/06/20      PT LONG TERM GOAL #4   Title  Patient will demonstrate improved R shoulder strength to >/= 4/5  for functional UE use    Status  On-going    Target Date  05/06/20      PT LONG TERM GOAL #5   Title  Patient to report ability to perform ADLs and daily household chores and activities without R shoulder pain provocation    Status  On-going    Target Date  05/06/20            Plan - 03/18/20 1459    Clinical Impression Statement  Diana George denies pain to start session however with some complaint of non-painful "popping" in R shoulder with pulley warmup flexion/scaption.  MT addressing increased tension/tightness throughout B UT, LS, R-sided posterior shoulder, R brachialis.  Pt. noting good tolerance for TPR to R UT, LS palpable TPs however limited response and may benefit from skilled manual therapy in this area.  Tolerated all gentle cervical/chest stretching without pain.  Will plan to address R shoulder AAROM activities to pt. tolerance in coming visits.    Comorbidities  DM, OSA, anxiety and depression, h/o R meniscal tear, L wrist fx, L tibia/ankle fx    Rehab Potential  Good    PT Treatment/Interventions  ADLs/Self Care Home Management;Cryotherapy;Electrical Stimulation;Iontophoresis 4mg /ml Dexamethasone;Moist Heat;Ultrasound;Functional mobility training;Therapeutic activities;Therapeutic exercise;Neuromuscular re-education;Patient/family education;Manual techniques;Passive range of motion;Dry needling;Taping;Vasopneumatic Device;Joint Manipulations    PT Next Visit Plan  Review initial HEP as needed per pt rerquest; manual therapy to address increased muscle tension/ttp; continue pain free AAROM, updating HEP as appropriate; modalities PRN for pain    PT Home Exercise Plan  5/13 - UT, LS and low doorway pec stretches, scap retraction; 03/18/20 - low/mid single arm doorway pec stretch    Consulted and Agree with Plan of Care  Patient       Patient will benefit from skilled therapeutic intervention in order to improve the following deficits and impairments:  Decreased activity  tolerance, Decreased mobility, Decreased range of motion, Decreased strength, Increased fascial restricitons, Increased muscle spasms, Impaired perceived functional ability, Impaired flexibility, Impaired UE functional use, Postural dysfunction, Improper body mechanics, Pain  Visit Diagnosis:  Acute pain of right shoulder  Stiffness of right shoulder, not elsewhere classified  Muscle weakness (generalized)  Abnormal posture  Other symptoms and signs involving the musculoskeletal system     Problem List Patient Active Problem List   Diagnosis Date Noted  . OSA (obstructive sleep apnea) 04/13/2014  . Allergic rhinitis 09/29/2012  . Situational stress 09/29/2012  . Hyperlipidemia 06/16/2011  . Diabetes mellitus (HCC) 06/16/2011  . Anxiety and depression 06/16/2011    Kermit Balo, PTA 03/18/20 4:49 PM    [facility] [address]  [address] [place], [place], [address] Phone: [phone]   Fax:  [phone]  Name: Diana George MRN: [id number] Date of Birth: [birth year]

## 2020-03-21 NOTE — Patient Instructions (Signed)
It was a pleasure to see you today.  Order written for physical therapy.  Continue meloxicam 15 mg daily.  Practice range of motion exercises with the right upper extremity.

## 2020-03-22 ENCOUNTER — Encounter: Payer: Self-pay | Admitting: Physical Therapy

## 2020-03-22 ENCOUNTER — Other Ambulatory Visit: Payer: Self-pay

## 2020-03-22 ENCOUNTER — Ambulatory Visit: Payer: Medicare Other | Admitting: Physical Therapy

## 2020-03-22 DIAGNOSIS — R293 Abnormal posture: Secondary | ICD-10-CM

## 2020-03-22 DIAGNOSIS — R29898 Other symptoms and signs involving the musculoskeletal system: Secondary | ICD-10-CM

## 2020-03-22 DIAGNOSIS — M25511 Pain in right shoulder: Secondary | ICD-10-CM

## 2020-03-22 DIAGNOSIS — M25611 Stiffness of right shoulder, not elsewhere classified: Secondary | ICD-10-CM

## 2020-03-22 DIAGNOSIS — M6281 Muscle weakness (generalized): Secondary | ICD-10-CM | POA: Diagnosis not present

## 2020-03-22 NOTE — Therapy (Signed)
[facility] [place] [address]  [place], [place], [address] Phone: [phone]   Fax:  [phone]  Physical Therapy Treatment  Patient Details  Name: Diana George MRN: [id number] Date of Birth: 09/23/1946 Referring Provider (PT): Emeline General, MD   Encounter Date: 03/22/2020  PT End of Session - 03/22/20 1443    Visit Number  4    Number of Visits  16    Date for PT Re-Evaluation  05/06/20    Authorization Type  BCBS Medicare    PT Start Time  1443    PT Stop Time  1532    PT Time Calculation (min)  49 min    Activity Tolerance  Patient tolerated treatment well;No increased pain    Behavior During Therapy  WFL for tasks assessed/performed       Past Medical History:  Diagnosis Date  . Anxiety   . Depression   . Diabetes mellitus   . History of coronary rotational ablation 2006  . Hyperlipidemia   . Renal stone   . Sleep apnea    uses cpap    Past Surgical History:  Procedure Laterality Date  . CATARACT EXTRACTION W/ INTRAOCULAR LENS  IMPLANT, BILATERAL  2010  . heart ablation  2010  . LEG SURGERY Left 2011   fracture with hardware  . STONE EXTRACTION WITH BASKET  2016, 1977  . TUBAL LIGATION  1977    There were no vitals filed for this visit.  Subjective Assessment - 03/22/20 1447    Subjective  Pt reporting increased pain Friday evening after manual therapy during last PT session, but feels pain better today. Notes good benefit from doorway pec stretch.    Diagnostic tests  X-ray 02/05/20: No evidence of displaced rib fracture or pneumothorax. Right calcific supraspinatus tendinosis.    Patient Stated Goals  "R arm to stop hurting and be stronger"    Currently in Pain?  No/denies         [facility] PT Assessment - 03/22/20 1443      AROM   Right Shoulder Flexion  146 Degrees    Right Shoulder ABduction  148 Degrees    Right Shoulder Internal Rotation  --   FIR to T10 - painful   Right  Shoulder External Rotation  --   FER to T3                   [facility] Adult PT Treatment/Exercise - 03/22/20 1443      Exercises   Exercises  Shoulder      Shoulder Exercises: Seated   Flexion  Both;Right;AAROM;10 reps    Flexion Limitations  wand      Shoulder Exercises: Standing   Internal Rotation  Right;10 reps;Strengthening;Theraband    Theraband Level (Shoulder Internal Rotation)  Level 1 (Yellow)    Internal Rotation Limitations  neutral shoulder with towel under elbow    Flexion  Right;10 reps;AAROM    Flexion Limitations  wall slides    ABduction  Right;10 reps;AAROM;AROM    ABduction Limitations  wall slides    Extension  Both;10 reps;Strengthening;Theraband    Theraband Level (Shoulder Extension)  Level 1 (Yellow)    Extension Limitations  VC & TC for scap retraction & depression    Row  Both;10 reps;Strengthening;Theraband    Theraband Level (Shoulder Row)  Level 1 (Yellow)    Row Limitations  VC & TC for scap retraction & depression  Shoulder Exercises: Pulleys   Flexion  3 minutes      Shoulder Exercises: Isometric Strengthening   Flexion  3X5"    Extension  5X5"    External Rotation  5X5"    Internal Rotation  5X5"    ABduction  5X5"      Manual Therapy   Manual Therapy  Soft tissue mobilization;Myofascial release    Manual therapy comments  seated     Soft tissue mobilization  STM & gentle DTM to R UT & LS - pt only noting ttp over UT today    Myofascial Release  manual TPR to R UT             PT Education - 03/22/20 1532    Education Details  HEP update - yellow TB rows/retraction, shoulder isometrics    Person(s) Educated  Patient    Methods  Explanation;Demonstration;Verbal cues;Tactile cues;Handout    Comprehension  Verbalized understanding;Returned demonstration;Verbal cues required;Tactile cues required;Need further instruction       PT Short Term Goals - 03/22/20 1448      PT SHORT TERM GOAL #1   Title  Patient will  be independent with initial HEP    Status  Achieved   03/22/20       PT Long Term Goals - 03/15/20 1447      PT LONG TERM GOAL #1   Title  Patient will be independent with ongoing/advanced HEP    Status  On-going    Target Date  05/06/20      PT LONG TERM GOAL #2   Title  Patient to demonstrate ability to achieve and maintain good R shoulder alignment/posturing to improve GHJ mechancis    Status  On-going    Target Date  05/06/20      PT LONG TERM GOAL #3   Title  Patient to improve R shoulder AROM to [facility] without pain provocation    Status  On-going    Target Date  05/06/20      PT LONG TERM GOAL #4   Title  Patient will demonstrate improved R shoulder strength to >/= 4/5 for functional UE use    Status  On-going    Target Date  05/06/20      PT LONG TERM GOAL #5   Title  Patient to report ability to perform ADLs and daily household chores and activities without R shoulder pain provocation    Status  On-going    Target Date  05/06/20            Plan - 03/22/20 1452    Clinical Impression Statement  [name] reports she hasn't noted any pain today and feels like her shoulder is moving better. R shoulder AROM significantly improved in all planes with current motion nearly symmetrical to L - pt still notes some tightness in upper arm when raising arm in flexion and abduction with slight pain reported with IR behind back. Pt reports HEP seems to be helping and notes good stretch with doorway pec stretches as well as improving awareness of posture and scapular activation - STG met. Introduced light resistance with scapular retraction for postural strengthening as well as isometric shoulder strengthening with good tolerance, therefore HEP instructions provided. Clancy is very pleased with her progress thus far and demonstrates good potential to further benefit from skilled PT to restore functional ROM and strength in R shoulder w/o pain.    Personal Factors and Comorbidities   Comorbidity 3+;Fitness;Time since onset of injury/illness/exacerbation  Comorbidities  DM, OSA, anxiety and depression, h/o R meniscal tear, L wrist fx, L tibia/ankle fx    Examination-Activity Limitations  Bathing;Bed Mobility;Caring for Others;Carry;Lift;Dressing;Hygiene/Grooming;Reach Overhead    Examination-Participation Restrictions  Church;Cleaning;Community Activity;Driving;Laundry;Meal Prep;Shop;Yard Work    Rehab Potential  Good    PT Frequency  2x / week   hopefully tapering to 1x/wk   PT Duration  8 weeks    PT Treatment/Interventions  ADLs/Self Care Home Management;Cryotherapy;Electrical Stimulation;Iontophoresis 14m/ml Dexamethasone;Moist Heat;Ultrasound;Functional mobility training;Therapeutic activities;Therapeutic exercise;Neuromuscular re-education;Patient/family education;Manual techniques;Passive range of motion;Dry needling;Taping;Vasopneumatic Device;Joint Manipulations    PT Next Visit Plan  manual therapy to address increased muscle tension/ttp; continue pain free AAROM, updating HEP as appropriate; modalities PRN for pain    PT Home Exercise Plan  5/13 - UT, LS and low doorway pec stretches, scap retraction; 5/20 - low/mid single arm doorway pec stretch; 5/24 - yellow TB rows/retraction, shoulder isometrics    Consulted and Agree with Plan of Care  Patient       Patient will benefit from skilled therapeutic intervention in order to improve the following deficits and impairments:  Decreased activity tolerance, Decreased mobility, Decreased range of motion, Decreased strength, Increased fascial restricitons, Increased muscle spasms, Impaired perceived functional ability, Impaired flexibility, Impaired UE functional use, Postural dysfunction, Improper body mechanics, Pain  Visit Diagnosis: Acute pain of right shoulder  Stiffness of right shoulder, not elsewhere classified  Muscle weakness (generalized)  Abnormal posture  Other symptoms and signs involving the  musculoskeletal system     Problem List Patient Active Problem List   Diagnosis Date Noted  . OSA (obstructive sleep apnea) 04/13/2014  . Allergic rhinitis 09/29/2012  . Situational stress 09/29/2012  . Hyperlipidemia 06/16/2011  . Diabetes mellitus (HTitus 06/16/2011  . Anxiety and depression 06/16/2011    JPercival Spanish PT, MPT 03/22/2020, 3:49 PM  [facility] [place] [address]Phone: [phone]  Fax:  [phone] Name: SWHITLEE SLUDERMRN: [id number] of Birth: [birth year]

## 2020-03-22 NOTE — Patient Instructions (Signed)
    Home exercise program created by Andreu Drudge, PT.  For questions, please contact Sonnie Pawloski via phone at [phone] or email at [email or web address]  [facility] [place] [address]  [address] [place], [place], [address] Phone: [phone]   Fax:  [phone]    

## 2020-03-25 ENCOUNTER — Other Ambulatory Visit: Payer: Self-pay

## 2020-03-25 ENCOUNTER — Ambulatory Visit: Payer: Medicare Other | Admitting: Physical Therapy

## 2020-03-25 DIAGNOSIS — R29898 Other symptoms and signs involving the musculoskeletal system: Secondary | ICD-10-CM | POA: Diagnosis not present

## 2020-03-25 DIAGNOSIS — M25511 Pain in right shoulder: Secondary | ICD-10-CM

## 2020-03-25 DIAGNOSIS — M6281 Muscle weakness (generalized): Secondary | ICD-10-CM | POA: Diagnosis not present

## 2020-03-25 DIAGNOSIS — M25611 Stiffness of right shoulder, not elsewhere classified: Secondary | ICD-10-CM | POA: Diagnosis not present

## 2020-03-25 DIAGNOSIS — R293 Abnormal posture: Secondary | ICD-10-CM

## 2020-03-25 NOTE — Therapy (Signed)
[facility] [address]  [address] [place], [place], [address] Phone: [phone]   Fax:  [phone]  Physical Therapy Treatment  Patient Details  Name: Diana George MRN: [id number] Date of Birth: July 07, 1946 Referring Provider (PT): Sharlet Salina, MD   Encounter Date: 03/25/2020  PT End of Session - 03/25/20 1453    Visit Number  5    Number of Visits  16    Date for PT Re-Evaluation  05/06/20    Authorization Type  BCBS Medicare    PT Start Time  1453    PT Stop Time  1531    PT Time Calculation (min)  38 min    Activity Tolerance  Patient tolerated treatment well    Behavior During Therapy  [facility] for tasks assessed/performed       Past Medical History:  Diagnosis Date  . Anxiety   . Depression   . Diabetes mellitus   . History of coronary rotational ablation 2006  . Hyperlipidemia   . Renal stone   . Sleep apnea    uses cpap    Past Surgical History:  Procedure Laterality Date  . CATARACT EXTRACTION W/ INTRAOCULAR LENS  IMPLANT, BILATERAL  2010  . heart ablation  2010  . LEG SURGERY Left 2011   fracture with hardware  . STONE EXTRACTION WITH BASKET  2016, 1977  . TUBAL LIGATION  1977    There were no vitals filed for this visit.  Subjective Assessment - 03/25/20 1456    Subjective  Pt reports "i did exactly what you told me not to do - I pushed too hard because it was feeling better and now my shoulder is sore again."    Diagnostic tests  X-ray 02/05/20: No evidence of displaced rib fracture or pneumothorax. Right calcific supraspinatus tendinosis.    Patient Stated Goals  "R arm to stop hurting and be stronger"    Currently in Pain?  Yes    Pain Score  2     Pain Location  Shoulder    Pain Orientation  Right    Pain Descriptors / Indicators  Sore    Pain Type  Acute pain    Pain Frequency  Intermittent                        [facility] Adult PT Treatment/Exercise - 03/25/20 1453       Shoulder Exercises: Prone   Extension  Both;10 reps;AROM;Strengthening    Extension Limitations  I's leaning over orange Pball on mat table    Horizontal ABduction 1  Both;10 reps;AROM;Strengthening    Horizontal ABduction 1 Limitations  T's leaning over orange Pball on mat table      Shoulder Exercises: Standing   Flexion  Right;5 reps;AROM;Strengthening;Weights   2 sets   Shoulder Flexion Weight (lbs)  4 oz    Flexion Limitations  cabinet reaches - 1st set AROM, 2nd set with 4 oz putty tub    ABduction  Right;5 reps;AROM;Strengthening;Weights   2 sets   Shoulder ABduction Weight (lbs)  4 oz    ABduction Limitations  scaption cabinet reaches - 1st set AROM, 2nd set with 4 oz putty tub    Extension  Both;10 reps;Strengthening;Theraband    Theraband Level (Shoulder Extension)  Level 1 (Yellow)    Extension Limitations  VC for scap retraction & depression with increassed hold time up to 3-5 sec    Row  Both;10 reps;Strengthening;Theraband    Theraband Level (Shoulder Row)  Level 1 (Yellow)    Row Limitations  VC & TC for B motion (pt attempting to alt UE) with scap retraction/depression and increased hold time of 3-5 sec      Shoulder Exercises: Pulleys   Flexion  3 minutes    Scaption  3 minutes      Shoulder Exercises: Therapy Ball   Flexion  Both;10 reps    Flexion Limitations  orange Pball on wall      Shoulder Exercises: Isometric Strengthening   Flexion  5X5"    Flexion Limitations  cues for elbow/hand in line with shoulder with 50% effort on push    Extension  5X5"    External Rotation  5X5"    External Rotation Limitations  cues for neutral shoulder and no more than 50% effort    Internal Rotation  5X5"    Internal Rotation Limitations  cues for neutral shoulder and no more than 50% effort    ABduction  5X5"    ABduction Limitations  cues to avoid leaning into arm               PT Short Term Goals - 03/22/20 1448      PT SHORT TERM GOAL #1   Title   Patient will be independent with initial HEP    Status  Achieved   03/22/20       PT Long Term Goals - 03/15/20 1447      PT LONG TERM GOAL #1   Title  Patient will be independent with ongoing/advanced HEP    Status  On-going    Target Date  05/06/20      PT LONG TERM GOAL #2   Title  Patient to demonstrate ability to achieve and maintain good R shoulder alignment/posturing to improve GHJ mechancis    Status  On-going    Target Date  05/06/20      PT LONG TERM GOAL #3   Title  Patient to improve R shoulder AROM to [facility] without pain provocation    Status  On-going    Target Date  05/06/20      PT LONG TERM GOAL #4   Title  Patient will demonstrate improved R shoulder strength to >/= 4/5 for functional UE use    Status  On-going    Target Date  05/06/20      PT LONG TERM GOAL #5   Title  Patient to report ability to perform ADLs and daily household chores and activities without R shoulder pain provocation    Status  On-going    Target Date  05/06/20            Plan - 03/25/20 1459    Clinical Impression Statement  Jerelyn reports increased pain after "overdoing" her HEP because her shoulder was feeling better. Review of latest HEP update requiring repeat instructions and clarifications of positioning, movement patterns, hold times and degree of force/effort with patient acknowledging better understanding and decreased pain after review. Progressed overhead AROM with functional emphasis with pain remaining decreased throughout all exercises, however deferred HEP update until able to complete prior HEP w/o further exacerbation of pain.    Personal Factors and Comorbidities  Comorbidity 3+;Fitness;Time since onset of injury/illness/exacerbation    Comorbidities  DM, OSA, anxiety and depression, h/o R meniscal tear, L wrist fx, L tibia/ankle fx    Examination-Activity Limitations  Bathing;Bed Mobility;Caring for Others;Carry;Lift;Dressing;Hygiene/Grooming;Reach Overhead     Examination-Participation  Restrictions  Church;Cleaning;Community Activity;Driving;Laundry;Meal Prep;Shop;Yard Work    Rehab Potential  Good    PT Frequency  2x / week   hopefully tapering to 1x/wk   PT Duration  8 weeks    PT Treatment/Interventions  ADLs/Self Care Home Management;Cryotherapy;Electrical Stimulation;Iontophoresis 4mg /ml Dexamethasone;Moist Heat;Ultrasound;Functional mobility training;Therapeutic activities;Therapeutic exercise;Neuromuscular re-education;Patient/family education;Manual techniques;Passive range of motion;Dry needling;Taping;Vasopneumatic Device;Joint Manipulations    PT Next Visit Plan  manual therapy to address increased muscle tension/ttp; continue pain free AA/AROM, updating HEP as appropriate; modalities PRN for pain    PT Home Exercise Plan  5/13 - UT, LS and low doorway pec stretches, scap retraction; 5/20 - low/mid single arm doorway pec stretch; 5/24 - yellow TB rows/retraction, shoulder isometrics    Consulted and Agree with Plan of Care  Patient       Patient will benefit from skilled therapeutic intervention in order to improve the following deficits and impairments:  Decreased activity tolerance, Decreased mobility, Decreased range of motion, Decreased strength, Increased fascial restricitons, Increased muscle spasms, Impaired perceived functional ability, Impaired flexibility, Impaired UE functional use, Postural dysfunction, Improper body mechanics, Pain  Visit Diagnosis: Acute pain of right shoulder  Stiffness of right shoulder, not elsewhere classified  Muscle weakness (generalized)  Abnormal posture  Other symptoms and signs involving the musculoskeletal system     Problem List Patient Active Problem List   Diagnosis Date Noted  . OSA (obstructive sleep apnea) 04/13/2014  . Allergic rhinitis 09/29/2012  . Situational stress 09/29/2012  . Hyperlipidemia 06/16/2011  . Diabetes mellitus (HCC) 06/16/2011  . Anxiety and depression  06/16/2011    06/18/2011, PT, MPT 03/25/2020, 8:55 PM  [facility] [address]  [address] [place], [place], [place] Phone: [phone]   Fax:  [phone]  Name: ZANE SAMSON MRN: Ihor Gully Date of Birth: 1946/06/07

## 2020-03-31 ENCOUNTER — Other Ambulatory Visit: Payer: Self-pay

## 2020-03-31 DIAGNOSIS — G4733 Obstructive sleep apnea (adult) (pediatric): Secondary | ICD-10-CM | POA: Diagnosis not present

## 2020-03-31 MED ORDER — SIMVASTATIN 20 MG PO TABS: 20.0000 mg | ORAL_TABLET | Freq: Every day | ORAL | 1 refills | Status: DC

## 2020-04-01 ENCOUNTER — Other Ambulatory Visit: Payer: Self-pay

## 2020-04-01 ENCOUNTER — Encounter: Payer: Self-pay | Admitting: Physical Therapy

## 2020-04-01 ENCOUNTER — Ambulatory Visit: Payer: Medicare Other | Attending: Internal Medicine | Admitting: Physical Therapy

## 2020-04-01 DIAGNOSIS — R293 Abnormal posture: Secondary | ICD-10-CM | POA: Insufficient documentation

## 2020-04-01 DIAGNOSIS — M25611 Stiffness of right shoulder, not elsewhere classified: Secondary | ICD-10-CM | POA: Insufficient documentation

## 2020-04-01 DIAGNOSIS — M6281 Muscle weakness (generalized): Secondary | ICD-10-CM | POA: Diagnosis not present

## 2020-04-01 DIAGNOSIS — M25511 Pain in right shoulder: Secondary | ICD-10-CM | POA: Diagnosis not present

## 2020-04-01 DIAGNOSIS — R29898 Other symptoms and signs involving the musculoskeletal system: Secondary | ICD-10-CM | POA: Insufficient documentation

## 2020-04-01 NOTE — Therapy (Signed)
[facility] [address]  [place], [place], [address] Phone: [phone]   Fax:  [phone]  Physical Therapy Treatment  Patient Details  Name: Diana George MRN: [id number] Date of Birth: 1946-05-06 Referring Provider (PT): Emeline General, MD   Encounter Date: 04/01/2020  PT End of Session - 04/01/20 1659    Visit Number  6    Number of Visits  16    Date for PT Re-Evaluation  05/06/20    Authorization Type  BCBS Medicare    PT Start Time  1659    PT Stop Time  1811    PT Time Calculation (min)  72 min    Activity Tolerance  Patient tolerated treatment well    Behavior During Therapy  [facility] for tasks assessed/performed       Past Medical History:  Diagnosis Date  . Anxiety   . Depression   . Diabetes mellitus   . History of coronary rotational ablation 2006  . Hyperlipidemia   . Renal stone   . Sleep apnea    uses cpap    Past Surgical History:  Procedure Laterality Date  . CATARACT EXTRACTION W/ INTRAOCULAR LENS  IMPLANT, BILATERAL  2010  . heart ablation  2010  . LEG SURGERY Left 2011   fracture with hardware  . STONE EXTRACTION WITH BASKET  2016, 1977  . TUBAL LIGATION  1977    There were no vitals filed for this visit.  Subjective Assessment - 04/01/20 1701    Subjective  Pt reports increased upper arm soreness yesterday and today w/o known trigger, but states shoulder feels ok.    Diagnostic tests  X-ray 02/05/20: No evidence of displaced rib fracture or pneumothorax. Right calcific supraspinatus tendinosis.    Patient Stated Goals  "R arm to stop hurting and be stronger"    Currently in Pain?  Yes    Pain Score  4     Pain Location  Arm    Pain Orientation  Right;Upper    Pain Descriptors / Indicators  --   "sudden ouch" with motion   Pain Type  Acute pain    Pain Frequency  Intermittent                        [facility] Adult PT Treatment/Exercise - 04/01/20 1659       Exercises   Exercises  Shoulder      Shoulder Exercises: Supine   Protraction  Right;10 reps;AROM    Flexion  Right;10 reps;AAROM;AROM    Flexion Limitations  PT initially guiding motion to promote proper GHJ rhythm with pt able to take over motion in pain free range    Other Supine Exercises  R shoulder CW/CCW circles at 90 flexion x 10 each way      Shoulder Exercises: Sidelying   External Rotation  Right;10 reps;AAROM;AROM    External Rotation Limitations  PT initially guiding motion to promote increased scap retraction + depression with pt able to take over motion in pain free range    ABduction  Right;10 reps;AAROM;AROM    ABduction Limitations  20-100 dg - PT initially guiding motion to promote proper GHJ rhythm with pt able to take over motion in pain free range    Other Sidelying Exercises  R shoulder CW/CCW circles at 90 scaption/abduction x 10 each way      Shoulder Exercises: Pulleys   Flexion  3 minutes  Modalities   Modalities  Electrical Stimulation;Cryotherapy      Cryotherapy   Number Minutes Cryotherapy  15 Minutes    Cryotherapy Location  Upper arm    Type of Cryotherapy  Ice pack      Electrical Stimulation   Electrical Stimulation Location  R shoulder complex & uuper arm    Electrical Stimulation Action  IFC    Electrical Stimulation Parameters  80-150 Hz, intensity to pt tolerance x 15'    Electrical Stimulation Goals  Pain;Tone      Manual Therapy   Manual Therapy  Joint mobilization;Soft tissue mobilization;Myofascial release    Manual therapy comments  supine    Joint Mobilization  gentle grade II-III distraction for pain relief; grade II-III inf and A/P mobs    Soft tissue mobilization  STM & gentle DTM to R pecs, deltoids and posterior shoulder complex    Myofascial Release  gentle manual TPR to R middle deltoid, infraspintus and teres group - pt reporting decrease in pain/ttp             PT Education - 04/01/20 1805    Education  Details  Info on home TENS unit    Person(s) Educated  Patient    Methods  Explanation;Handout    Comprehension  Verbalized understanding       PT Short Term Goals - 03/22/20 1448      PT SHORT TERM GOAL #1   Title  Patient will be independent with initial HEP    Status  Achieved   03/22/20       PT Long Term Goals - 04/01/20 1705      PT LONG TERM GOAL #1   Title  Patient will be independent with ongoing/advanced HEP    Status  Partially Met    Target Date  05/06/20      PT LONG TERM GOAL #2   Title  Patient to demonstrate ability to achieve and maintain good R shoulder alignment/posturing to improve GHJ mechancis    Status  Partially Met    Target Date  05/06/20      PT LONG TERM GOAL #3   Title  Patient to improve R shoulder AROM to [facility] without pain provocation    Status  On-going    Target Date  05/06/20      PT LONG TERM GOAL #4   Title  Patient will demonstrate improved R shoulder strength to >/= 4/5 for functional UE use    Status  On-going    Target Date  05/06/20      PT LONG TERM GOAL #5   Title  Patient to report ability to perform ADLs and daily household chores and activities without R shoulder pain provocation    Status  On-going    Target Date  05/06/20            Plan - 04/01/20 1706    Clinical Impression Statement  Sotiria reports good compliance with HEP but admits to continued need for written instructions to make sure she is doing the exercises correctly. She feels that she is doing much better with improving postural awareness - LTGs #1 & 2 now partially met. Cabella reports increased soreness in R upper arm over past 2 days w/o known trigger, but states shoulder remains good w/o pain. Pain limiting ability to reach overhead when doing things such as drying her hair and with reaching across her body to fasten/unfasten seat belt. Pt ttp with taut bands identified in  R pecs, deltoids and teres group but UT and LS no longer painful as on previous  visits. Some relief noted with manual therapy. Worked on guiding pt though AROM patterns within pain free ROM while targeting proper scapular and GHJ rhythm with pt able to gradually increase pain free ROM. Session concluded with estim and ice to promote further pain relief. Pt noting benefit from this, therefore provided information on home TENS unit options.    Personal Factors and Comorbidities  Comorbidity 3+;Fitness;Time since onset of injury/illness/exacerbation    Comorbidities  DM, OSA, anxiety and depression, h/o R meniscal tear, L wrist fx, L tibia/ankle fx    Examination-Activity Limitations  Bathing;Bed Mobility;Caring for Others;Carry;Lift;Dressing;Hygiene/Grooming;Reach Overhead    Examination-Participation Restrictions  Church;Cleaning;Community Activity;Driving;Laundry;Meal Prep;Shop;Yard Work    Rehab Potential  Good    PT Frequency  2x / week   hopefully tapering to 1x/wk   PT Duration  8 weeks    PT Treatment/Interventions  ADLs/Self Care Home Management;Cryotherapy;Electrical Stimulation;Iontophoresis '4mg'$ /ml Dexamethasone;Moist Heat;Ultrasound;Functional mobility training;Therapeutic activities;Therapeutic exercise;Neuromuscular re-education;Patient/family education;Manual techniques;Passive range of motion;Dry needling;Taping;Vasopneumatic Device;Joint Manipulations    PT Next Visit Plan  manual therapy to address increased muscle tension/ttp; continue pain free AA/AROM, updating HEP as appropriate; modalities PRN for pain    PT Home Exercise Plan  5/13 - UT, LS and low doorway pec stretches, scap retraction; 5/20 - low/mid single arm doorway pec stretch; 5/24 - yellow TB rows/retraction, shoulder isometrics    Consulted and Agree with Plan of Care  Patient       Patient will benefit from skilled therapeutic intervention in order to improve the following deficits and impairments:  Decreased activity tolerance, Decreased mobility, Decreased range of motion, Decreased strength,  Increased fascial restricitons, Increased muscle spasms, Impaired perceived functional ability, Impaired flexibility, Impaired UE functional use, Postural dysfunction, Improper body mechanics, Pain  Visit Diagnosis: Acute pain of right shoulder  Stiffness of right shoulder, not elsewhere classified  Muscle weakness (generalized)  Abnormal posture  Other symptoms and signs involving the musculoskeletal system     Problem List Patient Active Problem List   Diagnosis Date Noted  . OSA (obstructive sleep apnea) 04/13/2014  . Allergic rhinitis 09/29/2012  . Situational stress 09/29/2012  . Hyperlipidemia 06/16/2011  . Diabetes mellitus ([place]) 06/16/2011  . Anxiety and depression 06/16/2011    Percival Spanish, PT, MPT 04/01/2020, 6:30 PM  [facility] [address]  [place], [place], [address] Phone: [phone]   Fax:  [phone]  Name: LOSSIE KALP MRN: [id number] Date of Birth: 1946-05-02

## 2020-04-01 NOTE — Patient Instructions (Signed)
TENS UNIT  This is helpful for muscle pain and spasm.   Search and Purchase a TENS 7000 2nd edition at www.tenspros.com or www.amazon.com  (It should be less than $30)     TENS unit instructions:   Do not shower or bathe with the unit on  Turn the unit off before removing electrodes or batteries  If the electrodes lose stickiness add a drop of water to the electrodes after they are disconnected from the unit and place on plastic sheet. If you continued to have difficulty, call the TENS unit company to purchase more electrodes.  Do not apply lotion on the skin area prior to use. Make sure the skin is clean and dry as this will help prolong the life of the electrodes.  After use, always check skin for unusual red areas, rash or other skin difficulties. If there are any skin problems, does not apply electrodes to the same area.  Never remove the electrodes from the unit by pulling the wires.  Do not use the TENS unit or electrodes other than as directed.  Do not change electrode placement without consulting your therapist or physician.  Keep 2 fingers with between each electrode.   TENS stands for Transcutaneous Electrical Nerve Stimulation. In other words, electrical impulses are allowed to pass through the skin in order to excite a nerve.   Purpose and Use of TENS:  TENS is a method used to manage acute and chronic pain without the use of drugs. It has been effective in managing pain associated with surgery, sprains, strains, trauma, rheumatoid arthritis, and neuralgias. It is a non-addictive, low risk, and non-invasive technique used to control pain. It is not, by any means, a curative form of treatment.   How TENS Works:  Most TENS units are a Statistician unit powered by one 9 volt battery. Attached to the outside of the unit are two lead wires where two pins and/or snaps connect on each wire. All units come with a set of four reusable pads or electrodes. These are placed  on the skin surrounding the area involved. By inserting the leads into  the pads, the electricity can pass from the unit making the circuit complete.  As the intensity is turned up slowly, the electrical current enters the body from the electrodes through the skin to the surrounding nerve fibers. This triggers the release of hormones from within the body. These hormones contain pain relievers. By increasing the circulation of these hormones, the persons pain may be lessened. It is also believed that the electrical stimulation itself helps to block the pain messages being sent to the brain, thus also decreasing the bodys perception of pain.   Hazards:  TENS units are NOT to be used by patients with PACEMAKERS, DEFIBRILLATORS, DIABETIC PUMPS, PREGNANT WOMEN, and patients with SEIZURE DISORDERS.  TENS units are NOT to be used over the heart, throat, brain, or spinal cord.  One of the major side effects from the TENS unit may be skin irritation. Some people may develop a rash if they are sensitive to the materials used in the electrodes or the connecting wires.   Wear the unit for up to 30-45 minutes, 3-4x/day as needed for pain.   Avoid overuse due the body getting used to the stem making it not as effective over time.

## 2020-04-05 ENCOUNTER — Ambulatory Visit: Payer: Medicare Other | Admitting: Physical Therapy

## 2020-04-07 ENCOUNTER — Ambulatory Visit: Payer: Medicare Other

## 2020-04-07 ENCOUNTER — Other Ambulatory Visit: Payer: Self-pay

## 2020-04-07 DIAGNOSIS — R29898 Other symptoms and signs involving the musculoskeletal system: Secondary | ICD-10-CM

## 2020-04-07 DIAGNOSIS — M25611 Stiffness of right shoulder, not elsewhere classified: Secondary | ICD-10-CM

## 2020-04-07 DIAGNOSIS — R293 Abnormal posture: Secondary | ICD-10-CM | POA: Diagnosis not present

## 2020-04-07 DIAGNOSIS — M25511 Pain in right shoulder: Secondary | ICD-10-CM | POA: Diagnosis not present

## 2020-04-07 DIAGNOSIS — M6281 Muscle weakness (generalized): Secondary | ICD-10-CM

## 2020-04-07 NOTE — Therapy (Signed)
[facility] [address]  [place], [place], [address] Phone: [phone]   Fax:  [phone]  Physical Therapy Treatment  Patient Details  Name: Diana George MRN: [id number] Date of Birth: Jan 10, 1946 Referring Provider (PT): Emeline General, MD   Encounter Date: 04/07/2020  PT End of Session - 04/07/20 1728    Visit Number  7    Number of Visits  16    Date for PT Re-Evaluation  05/06/20    Authorization Type  BCBS Medicare    PT Start Time  1702    PT Stop Time  1745    PT Time Calculation (min)  43 min    Activity Tolerance  Patient tolerated treatment well    Behavior During Therapy  [facility] for tasks assessed/performed       Past Medical History:  Diagnosis Date  . Anxiety   . Depression   . Diabetes mellitus   . History of coronary rotational ablation 2006  . Hyperlipidemia   . Renal stone   . Sleep apnea    uses cpap    Past Surgical History:  Procedure Laterality Date  . CATARACT EXTRACTION W/ INTRAOCULAR LENS  IMPLANT, BILATERAL  2010  . heart ablation  2010  . LEG SURGERY Left 2011   fracture with hardware  . STONE EXTRACTION WITH BASKET  2016, 1977  . TUBAL LIGATION  1977    There were no vitals filed for this visit.  Subjective Assessment - 04/07/20 1728    Subjective  Pt. noting improvement in shoulder pain.  Was able to purchase home TENS 7000 TENS unit.    Diagnostic tests  X-ray 02/05/20: No evidence of displaced rib fracture or pneumothorax. Right calcific supraspinatus tendinosis.    Patient Stated Goals  "R arm to stop hurting and be stronger"    Currently in Pain?  Yes    Pain Score  2     Pain Location  Shoulder    Pain Orientation  Right;Upper    Pain Descriptors / Indicators  --   "sudden ouch"   Pain Type  Acute pain    Pain Onset  More than a month ago    Multiple Pain Sites  No                        [facility] Adult PT Treatment/Exercise - 04/07/20 0001       Self-Care   Self-Care  Other Self-Care Comments    Other Self-Care Comments   Discussed home TENS 7000 TENS unit ; discussed proper setup, parameters, electrode placement       Shoulder Exercises: Seated   Flexion  Both;10 reps;AAROM    Flexion Limitations  seated wand 0-90 dg then 0-110 dg    noted pain free and good scapulohumeral rhythm      Shoulder Exercises: Standing   Row  Both;10 reps;Strengthening;Theraband    Theraband Level (Shoulder Row)  Level 2 (Red)      Shoulder Exercises: Pulleys   Flexion  3 minutes    Scaption  3 minutes      Manual Therapy   Manual Therapy  Soft tissue mobilization    Manual therapy comments  sitting     Soft tissue mobilization  STM to R shoulder anterior, lateral, posterior muscualture - pt. denied ttp     Myofascial Release  TPR to R UT  PT Short Term Goals - 03/22/20 1448      PT SHORT TERM GOAL #1   Title  Patient will be independent with initial HEP    Status  Achieved   03/22/20       PT Long Term Goals - 04/01/20 1705      PT LONG TERM GOAL #1   Title  Patient will be independent with ongoing/advanced HEP    Status  Partially Met    Target Date  05/06/20      PT LONG TERM GOAL #2   Title  Patient to demonstrate ability to achieve and maintain good R shoulder alignment/posturing to improve GHJ mechancis    Status  Partially Met    Target Date  05/06/20      PT LONG TERM GOAL #3   Title  Patient to improve R shoulder AROM to [facility] without pain provocation    Status  On-going    Target Date  05/06/20      PT LONG TERM GOAL #4   Title  Patient will demonstrate improved R shoulder strength to >/= 4/5 for functional UE use    Status  On-going    Target Date  05/06/20      PT LONG TERM GOAL #5   Title  Patient to report ability to perform ADLs and daily household chores and activities without R shoulder pain provocation    Status  On-going    Target Date  05/06/20            Plan -  04/07/20 1730    Clinical Impression Statement  Kloee noting good improvement in R shoulder comfort over this past week.  Feels that manual therapy last session contributed to much improved pain.  Able to demo improved scapulohumeral rhythm with AAROM 0-110 dg seated shoulder flexion today without pain.  Did spent significant time in session instructing pt. in proper setup and operation of home TENS 7000 TENS unit as pt. has purchased this unit.  Pt. planning on bring this unit in to tx session soon for further review.  Ended tx pain free thus modalities deferred.    Comorbidities  DM, OSA, anxiety and depression, h/o R meniscal tear, L wrist fx, L tibia/ankle fx    Rehab Potential  Good    PT Frequency  2x / week    PT Treatment/Interventions  ADLs/Self Care Home Management;Cryotherapy;Electrical Stimulation;Iontophoresis 74m/ml Dexamethasone;Moist Heat;Ultrasound;Functional mobility training;Therapeutic activities;Therapeutic exercise;Neuromuscular re-education;Patient/family education;Manual techniques;Passive range of motion;Dry needling;Taping;Vasopneumatic Device;Joint Manipulations    PT Home Exercise Plan  5/13 - UT, LS and low doorway pec stretches, scap retraction; 5/20 - low/mid single arm doorway pec stretch; 5/24 - yellow TB rows/retraction, shoulder isometrics    Consulted and Agree with Plan of Care  Patient       Patient will benefit from skilled therapeutic intervention in order to improve the following deficits and impairments:  Decreased activity tolerance, Decreased mobility, Decreased range of motion, Decreased strength, Increased fascial restricitons, Increased muscle spasms, Impaired perceived functional ability, Impaired flexibility, Impaired UE functional use, Postural dysfunction, Improper body mechanics, Pain  Visit Diagnosis: Acute pain of right shoulder  Stiffness of right shoulder, not elsewhere classified  Muscle weakness (generalized)  Abnormal posture  Other  symptoms and signs involving the musculoskeletal system     Problem List Patient Active Problem List   Diagnosis Date Noted  . OSA (obstructive sleep apnea) 04/13/2014  . Allergic rhinitis 09/29/2012  . Situational stress 09/29/2012  . Hyperlipidemia 06/16/2011  .  Diabetes mellitus ([place]) 06/16/2011  . Anxiety and depression 06/16/2011    Bess Harvest, PTA 04/07/20 5:59 PM   [facility] [address]  [place], [place], [address] Phone: [phone]   Fax:  [phone]  Name: FATMATA LEGERE MRN: [id number] Date of Birth: Jul 03, 1946

## 2020-04-12 ENCOUNTER — Ambulatory Visit: Payer: Medicare Other

## 2020-04-15 ENCOUNTER — Encounter: Payer: Self-pay | Admitting: Physical Therapy

## 2020-04-15 ENCOUNTER — Other Ambulatory Visit: Payer: Self-pay

## 2020-04-15 ENCOUNTER — Ambulatory Visit: Payer: Medicare Other | Admitting: Physical Therapy

## 2020-04-15 DIAGNOSIS — M6281 Muscle weakness (generalized): Secondary | ICD-10-CM | POA: Diagnosis not present

## 2020-04-15 DIAGNOSIS — R293 Abnormal posture: Secondary | ICD-10-CM

## 2020-04-15 DIAGNOSIS — M25511 Pain in right shoulder: Secondary | ICD-10-CM

## 2020-04-15 DIAGNOSIS — R29898 Other symptoms and signs involving the musculoskeletal system: Secondary | ICD-10-CM

## 2020-04-15 DIAGNOSIS — M25611 Stiffness of right shoulder, not elsewhere classified: Secondary | ICD-10-CM | POA: Diagnosis not present

## 2020-04-15 NOTE — Therapy (Signed)
[facility] [place] [address]  [place], [place], [address] Phone: [phone]   Fax:  [phone]  Physical Therapy Treatment  Patient Details  Name: Diana George MRN: [id number] Date of Birth: 1946/01/22 Referring Provider (PT): Emeline General, MD   Encounter Date: 04/15/2020   PT End of Session - 04/15/20 1401    Visit Number 8    Number of Visits 16    Date for PT Re-Evaluation 05/06/20    Authorization Type BCBS Medicare    PT Start Time 1401    PT Stop Time 2585    PT Time Calculation (min) 46 min    Activity Tolerance Patient tolerated treatment well    Behavior During Therapy [facility] for tasks assessed/performed           Past Medical History:  Diagnosis Date   Anxiety    Depression    Diabetes mellitus    History of coronary rotational ablation 2006   Hyperlipidemia    Renal stone    Sleep apnea    uses cpap    Past Surgical History:  Procedure Laterality Date   CATARACT EXTRACTION W/ INTRAOCULAR LENS  IMPLANT, BILATERAL  2010   heart ablation  2010   LEG SURGERY Left 2011   fracture with hardware   STONE EXTRACTION WITH BASKET  2016, [place]    There were no vitals filed for this visit.   Subjective Assessment - 04/15/20 1404    Subjective Pt reports a mishap with the home TENS unit where she turned the intensity up too high.    Diagnostic tests X-ray 02/05/20: No evidence of displaced rib fracture or pneumothorax. Right calcific supraspinatus tendinosis.    Patient Stated Goals "R arm to stop hurting and be stronger"    Currently in Pain? Yes    Pain Score 3     Pain Location Shoulder    Pain Orientation Right;Upper    Pain Descriptors / Indicators Aching    Pain Type Acute pain    Pain Radiating Towards none recently    Pain Frequency Intermittent                             [facility] Adult PT Treatment/Exercise - 04/15/20 1401       Self-Care   Other Self-Care Comments  Reviewed home TENS unit use including mode selection, electrode placement (with photo taken of electrode placement for current pain reports) and intensity adjustment as pt reporting she "shocked" herself with too high of an intensity when she attempted to set up unit on her own at home.      Exercises   Exercises Shoulder      Shoulder Exercises: Standing   Extension Both;10 reps;Strengthening;Theraband    Theraband Level (Shoulder Extension) Level 1 (Yellow)    Extension Limitations VC for scap retraction & depression with 3-5" hold time    Row Both;10 reps;Strengthening;Theraband    Theraband Level (Shoulder Row) Level 1 (Yellow)      Shoulder Exercises: ROM/Strengthening   UBE (Upper Arm Bike) L1.0 x 6 min (3' fwd/3' back)      Shoulder Exercises: IT sales professional 30 seconds;2 reps    Warehouse manager Limitations R single arm low Academic librarian  R shoulder complex & uuper arm    Electrical Stimulation Action TENS    Electrical Stimulation Parameters SD1, intensity to pt tol x 12 min    Electrical Stimulation Goals Pain;Tone      Manual Therapy   Manual Therapy Soft tissue mobilization;Myofascial release    Manual therapy comments supine    Soft tissue mobilization STM & gentle DTM to R pecs, deltoids and posterior shoulder complex    Myofascial Release manual TPR to R anterior deltoid            Trigger Point Dry Needling - 04/15/20 1401    Consent Given? Yes    Education Handout Provided Yes    Muscles Treated Upper Quadrant Deltoid    Deltoid Response Twitch response elicited;Palpable increased muscle length   R anterior deltoid                 PT Short Term Goals - 03/22/20 1448      PT SHORT TERM GOAL #1   Title Patient will be independent with initial HEP    Status Achieved   03/22/20             PT Long Term Goals - 04/01/20 1705      PT LONG TERM GOAL #1   Title Patient will be independent with ongoing/advanced HEP    Status Partially Met    Target Date 05/06/20      PT LONG TERM GOAL #2   Title Patient to demonstrate ability to achieve and maintain good R shoulder alignment/posturing to improve GHJ mechancis    Status Partially Met    Target Date 05/06/20      PT LONG TERM GOAL #3   Title Patient to improve R shoulder AROM to [facility] without pain provocation    Status On-going    Target Date 05/06/20      PT LONG TERM GOAL #4   Title Patient will demonstrate improved R shoulder strength to >/= 4/5 for functional UE use    Status On-going    Target Date 05/06/20      PT LONG TERM GOAL #5   Title Patient to report ability to perform ADLs and daily household chores and activities without R shoulder pain provocation    Status On-going    Target Date 05/06/20                 Plan - 04/15/20 1447    Clinical Impression Statement Diana George reports continued sensation of a band squeezing around her R upper arm with taut bands/TPs identified in anterior deltoid today - addressed with manual therapy including introduction of DN upon informed pt consent followed by review of stretching for anterior shoulder and postural strengthening. Pt requesting review of TENS set-up for home use as she as uncertain of electrode placement and accidentally turned the intensity up to high when she tried to use it on her own at home - reviewed home TENS unit use including mode selection, electrode placement (with photo taken of electrode placement for current pain reports) and intensity adjustment with pt verbalizing better understanding and improved comfort with unit following review.    Personal Factors and Comorbidities Comorbidity 3+;Fitness;Time since onset of injury/illness/exacerbation    Comorbidities DM, OSA, anxiety and depression, h/o R meniscal tear, L wrist fx, L tibia/ankle fx     Examination-Activity Limitations Bathing;Bed Mobility;Caring for Others;Carry;Lift;Dressing;Hygiene/Grooming;Reach Overhead    Examination-Participation Restrictions Church;Cleaning;Community Activity;Driving;Laundry;Meal Prep;Shop;Yard Work    Designer, multimedia  PT Frequency 2x / week    PT Duration 8 weeks    PT Treatment/Interventions ADLs/Self Care Home Management;Cryotherapy;Electrical Stimulation;Iontophoresis '4mg'$ /ml Dexamethasone;Moist Heat;Ultrasound;Functional mobility training;Therapeutic activities;Therapeutic exercise;Neuromuscular re-education;Patient/family education;Manual techniques;Passive range of motion;Dry needling;Taping;Vasopneumatic Device;Joint Manipulations    PT Next Visit Plan Continue pain free AA/AROM, updating HEP as appropriate; modalities PRN for pain    PT Home Exercise Plan 5/13 - UT, LS and low doorway pec stretches, scap retraction; 5/20 - low/mid single arm doorway pec stretch; 5/24 - yellow TB rows/retraction, shoulder isometrics    Consulted and Agree with Plan of Care Patient           Patient will benefit from skilled therapeutic intervention in order to improve the following deficits and impairments:  Decreased activity tolerance, Decreased mobility, Decreased range of motion, Decreased strength, Increased fascial restricitons, Increased muscle spasms, Impaired perceived functional ability, Impaired flexibility, Impaired UE functional use, Postural dysfunction, Improper body mechanics, Pain  Visit Diagnosis: Acute pain of right shoulder  Stiffness of right shoulder, not elsewhere classified  Muscle weakness (generalized)  Abnormal posture  Other symptoms and signs involving the musculoskeletal system     Problem List Patient Active Problem List   Diagnosis Date Noted   OSA (obstructive sleep apnea) 04/13/2014   Allergic rhinitis 09/29/2012   Situational stress 09/29/2012   Hyperlipidemia 06/16/2011   Diabetes mellitus ([place])  06/16/2011   Anxiety and depression 06/16/2011    Percival Spanish, PT, MPT 04/15/2020, 6:56 PM  [facility] [place] [address]  [place], [place], [address] Phone: [phone]   Fax:  [phone]  Name: Diana George MRN: [id number] Date of Birth: May 04, 1946

## 2020-04-15 NOTE — Patient Instructions (Signed)

## 2020-04-19 ENCOUNTER — Encounter: Payer: Self-pay | Admitting: Physical Therapy

## 2020-04-19 ENCOUNTER — Other Ambulatory Visit: Payer: Self-pay

## 2020-04-19 ENCOUNTER — Ambulatory Visit: Payer: Medicare Other | Admitting: Physical Therapy

## 2020-04-19 DIAGNOSIS — M25511 Pain in right shoulder: Secondary | ICD-10-CM

## 2020-04-19 DIAGNOSIS — M25611 Stiffness of right shoulder, not elsewhere classified: Secondary | ICD-10-CM

## 2020-04-19 DIAGNOSIS — R29898 Other symptoms and signs involving the musculoskeletal system: Secondary | ICD-10-CM

## 2020-04-19 DIAGNOSIS — M6281 Muscle weakness (generalized): Secondary | ICD-10-CM | POA: Diagnosis not present

## 2020-04-19 DIAGNOSIS — R293 Abnormal posture: Secondary | ICD-10-CM

## 2020-04-19 NOTE — Patient Instructions (Signed)
    Home exercise program created by Vonda Harth, PT.  For questions, please contact Camri Molloy via phone at [phone] or email at [email or web address]  [facility] [place] [facility] [address]  [address] [place], [place], [address] Phone: [phone]   Fax:  [phone]    

## 2020-04-19 NOTE — Therapy (Signed)
[facility] [place] [address]  [place], [place], [address] Phone: [phone]   Fax:  [phone]  Physical Therapy Treatment  Patient Details  Name: Diana George MRN: [id number] Date of Birth: [birth year] Referring Provider (PT): Emeline General, MD   Encounter Date: 04/19/2020   PT End of Session - 04/19/20 1356    Visit Number 9    Number of Visits 16    Date for PT Re-Evaluation 05/06/20    Authorization Type BCBS Medicare    PT Start Time 0947    PT Stop Time 1442    PT Time Calculation (min) 46 min    Activity Tolerance Patient tolerated treatment well    Behavior During Therapy [facility] for tasks assessed/performed           Past Medical History:  Diagnosis Date  . Anxiety   . Depression   . Diabetes mellitus   . History of coronary rotational ablation 2006  . Hyperlipidemia   . Renal stone   . Sleep apnea    uses cpap    Past Surgical History:  Procedure Laterality Date  . CATARACT EXTRACTION W/ INTRAOCULAR LENS  IMPLANT, BILATERAL  2010  . heart ablation  2010  . LEG SURGERY Left 2011   fracture with hardware  . STONE EXTRACTION WITH BASKET  2016, 1977  . TUBAL LIGATION  1977    There were no vitals filed for this visit.   Subjective Assessment - 04/19/20 1359    Subjective Pt reports significant increased pain the day following the DN last visit, but states this has now resolved back to her baseline. She feels more comfortable with the TENS unit after review last session.    Diagnostic tests X-ray 02/05/20: No evidence of displaced rib fracture or pneumothorax. Right calcific supraspinatus tendinosis.    Patient Stated Goals "R arm to stop hurting and be stronger"    Currently in Pain? Yes    Pain Score 3     Pain Location Arm    Pain Orientation Right;Upper    Pain Descriptors / Indicators Aching    Pain Type Acute pain    Pain Frequency Intermittent                              [facility] Adult PT Treatment/Exercise - 04/19/20 1356      Exercises   Exercises Shoulder      Shoulder Exercises: Supine   Horizontal ABduction Both;10 reps;Strengthening;Theraband    Theraband Level (Shoulder Horizontal ABduction) Level 1 (Yellow)    Horizontal ABduction Limitations cues for scap retraction    External Rotation Both;AROM    External Rotation Limitations cues for scap retraction - attempted with yellow TB but TB deferred d/t increased pain    Diagonals Both;10 reps;Strengthening;Theraband    Theraband Level (Shoulder Diagonals) Level 1 (Yellow)    Diagonals Limitations cues for scap retraction      Shoulder Exercises: Seated   Horizontal ABduction Both;10 reps;Strengthening;Theraband    Theraband Level (Shoulder Horizontal ABduction) Level 1 (Yellow)    Horizontal ABduction Limitations low angle - cues for scap retraction    External Rotation Both;10 reps;Strengthening;Theraband    Theraband Level (Shoulder External Rotation) Level 1 (Yellow)    External Rotation Limitations cues for scap retraction      Shoulder Exercises: Standing   External Rotation Right;5 reps;Strengthening;Theraband    Theraband Level (Shoulder External Rotation)  Level 1 (Yellow)    External Rotation Limitations isometric step-outs    Internal Rotation Right;10 reps;Strengthening;Theraband    Theraband Level (Shoulder Internal Rotation) Level 1 (Yellow)    Internal Rotation Limitations neutral shoulder      Shoulder Exercises: ROM/Strengthening   UBE (Upper Arm Bike) L1.5 x 6 min (3' fwd/3' back)      Manual Therapy   Manual Therapy Soft tissue mobilization;Myofascial release;Scapular mobilization    Manual therapy comments supine & L side lying    Soft tissue mobilization STM & gentle DTM to R pecs, deltoids, biceps, triceps and posterior shoulder complex    Myofascial Release manual TPR to R anterior deltoid & biceps    Scapular Mobilization R  scapula - retraction & depression                  PT Education - 04/19/20 1444    Education Details HEP update - yellow TB scap retraction + hooklying horiz ABD & seated ER    Person(s) Educated Patient    Methods Explanation;Demonstration;Verbal cues;Handout    Comprehension Verbalized understanding;Returned demonstration;Verbal cues required;Need further instruction            PT Short Term Goals - 03/22/20 1448      PT SHORT TERM GOAL #1   Title Patient will be independent with initial HEP    Status Achieved   03/22/20            PT Long Term Goals - 04/19/20 1402      PT LONG TERM GOAL #1   Title Patient will be independent with ongoing/advanced HEP    Status Partially Met    Target Date 05/06/20      PT LONG TERM GOAL #2   Title Patient to demonstrate ability to achieve and maintain good R shoulder alignment/posturing to improve GHJ mechancis    Status Partially Met    Target Date 05/06/20      PT LONG TERM GOAL #3   Title Patient to improve R shoulder AROM to [facility] without pain provocation    Status On-going    Target Date 05/06/20      PT LONG TERM GOAL #4   Title Patient will demonstrate improved R shoulder strength to >/= 4/5 for functional UE use    Status On-going    Target Date 05/06/20      PT LONG TERM GOAL #5   Title Patient to report ability to perform ADLs and daily household chores and activities without R shoulder pain provocation    Status On-going    Target Date 05/06/20                 Plan - 04/19/20 1403    Clinical Impression Statement Leala reports significantly increased pain following DN last session but states pain now back to normal. Pain currently localized more in R upper arm than shoulder and most aggravated by horizontal abduction and ER. Continued taut bands/TPs identified in anterior deltoid and upper biceps today which were address with further manual therapy w/o DN with improving tolerance for ER and horiz ABD  following manual therapy. HEP updated to include continued emphasis on postural strengthening/stabilization with scapular retraction + light strengthening for horiz AB & ER as exercises well tolerated during session, but pt cautioned to defer at home if pain were to increase.    Personal Factors and Comorbidities Comorbidity 3+;Fitness;Time since onset of injury/illness/exacerbation    Comorbidities DM, OSA, anxiety and depression, h/o R  meniscal tear, L wrist fx, L tibia/ankle fx    Examination-Activity Limitations Bathing;Bed Mobility;Caring for Others;Carry;Lift;Dressing;Hygiene/Grooming;Reach Overhead    Examination-Participation Restrictions Church;Cleaning;Community Activity;Driving;Laundry;Meal Prep;Shop;Yard Work    Rehab Potential Good    PT Frequency 2x / week    PT Duration 8 weeks    PT Treatment/Interventions ADLs/Self Care Home Management;Cryotherapy;Electrical Stimulation;Iontophoresis 32m/ml Dexamethasone;Moist Heat;Ultrasound;Functional mobility training;Therapeutic activities;Therapeutic exercise;Neuromuscular re-education;Patient/family education;Manual techniques;Passive range of motion;Dry needling;Taping;Vasopneumatic Device;Joint Manipulations    PT Next Visit Plan Continue pain free AA/AROM, updating HEP as appropriate; modalities PRN for pain    PT Home Exercise Plan 5/13 - UT, LS and low doorway pec stretches, scap retraction; 5/20 - low/mid single arm doorway pec stretch; 5/24 - yellow TB rows/retraction, shoulder isometrics    Consulted and Agree with Plan of Care Patient           Patient will benefit from skilled therapeutic intervention in order to improve the following deficits and impairments:  Decreased activity tolerance, Decreased mobility, Decreased range of motion, Decreased strength, Increased fascial restricitons, Increased muscle spasms, Impaired perceived functional ability, Impaired flexibility, Impaired UE functional use, Postural dysfunction, Improper  body mechanics, Pain  Visit Diagnosis: Acute pain of right shoulder  Stiffness of right shoulder, not elsewhere classified  Muscle weakness (generalized)  Abnormal posture  Other symptoms and signs involving the musculoskeletal system     Problem List Patient Active Problem List   Diagnosis Date Noted  . OSA (obstructive sleep apnea) 04/13/2014  . Allergic rhinitis 09/29/2012  . Situational stress 09/29/2012  . Hyperlipidemia 06/16/2011  . Diabetes mellitus ([place] 06/16/2011  . Anxiety and depression 06/16/2011    JPercival Spanish PT, MPT 04/19/2020, 6:58 PM  [facility] [place] [address]Phone: [phone]  Fax:  [phone] Name: SKAYLEENA EKEMRN: [id number] of Birth: 127-Jan-1947

## 2020-04-22 ENCOUNTER — Ambulatory Visit: Payer: Medicare Other | Admitting: Physical Therapy

## 2020-04-22 ENCOUNTER — Other Ambulatory Visit: Payer: Self-pay

## 2020-04-22 ENCOUNTER — Encounter: Payer: Self-pay | Admitting: Physical Therapy

## 2020-04-22 DIAGNOSIS — R29898 Other symptoms and signs involving the musculoskeletal system: Secondary | ICD-10-CM

## 2020-04-22 DIAGNOSIS — M6281 Muscle weakness (generalized): Secondary | ICD-10-CM

## 2020-04-22 DIAGNOSIS — M25511 Pain in right shoulder: Secondary | ICD-10-CM

## 2020-04-22 DIAGNOSIS — M25611 Stiffness of right shoulder, not elsewhere classified: Secondary | ICD-10-CM | POA: Diagnosis not present

## 2020-04-22 DIAGNOSIS — R293 Abnormal posture: Secondary | ICD-10-CM | POA: Diagnosis not present

## 2020-04-23 NOTE — Therapy (Signed)
[facility] [address]  [place], [place], [address] Phone: [phone]   Fax:  [phone]  Physical Therapy Recert / Treatment  Patient Details  Name: Diana George MRN: [id number] Date of Birth: [birth year] Referring Provider (PT): Emeline General, MD  Progress Note  Reporting Period 03/11/2020 to 04/22/2020  See note below for Objective Data and Assessment of Progress/Goals.     Encounter Date: 04/22/2020   PT End of Session - 04/22/20 1406    Visit Number 10    Number of Visits 20    Date for PT Re-Evaluation 06/04/20    Authorization Type BCBS Medicare    PT Start Time 1406    PT Stop Time 1448    PT Time Calculation (min) 42 min    Activity Tolerance Patient tolerated treatment well    Behavior During Therapy WFL for tasks assessed/performed           Past Medical History:  Diagnosis Date  . Anxiety   . Depression   . Diabetes mellitus   . History of coronary rotational ablation 2006  . Hyperlipidemia   . Renal stone   . Sleep apnea    uses cpap    Past Surgical History:  Procedure Laterality Date  . CATARACT EXTRACTION W/ INTRAOCULAR LENS  IMPLANT, BILATERAL  2010  . heart ablation  2010  . LEG SURGERY Left 2011   fracture with hardware  . STONE EXTRACTION WITH BASKET  2016, 1977  . TUBAL LIGATION  1977    There were no vitals filed for this visit.   Subjective Assessment - 04/22/20 1410    Subjective Pt reports the last few days have been much better.    Diagnostic tests X-ray 02/05/20: No evidence of displaced rib fracture or pneumothorax. Right calcific supraspinatus tendinosis.    Patient Stated Goals "R arm to stop hurting and be stronger"    Currently in Pain? No/denies    Pain Score 0-No pain   up to 0/63 with certain motions, but no pain at rest   Pain Orientation Right;Upper    Pain Descriptors / Indicators Sharp   brief   Pain Type Acute pain    Pain Frequency  Intermittent              [facility] PT Assessment - 04/22/20 1406      Assessment   Medical Diagnosis R shoulder pain    Referring Provider (PT) Emeline General, MD    Onset Date/Surgical Date 01/24/20    Hand Dominance Right    Next MD Visit PRN for shoulder, annual physical 05/18/20      Observation/Other Assessments   Focus on Therapeutic Outcomes (FOTO)  Shoulder - 61% (39% limitation)      AROM   Right Shoulder Flexion 150 Degrees    Right Shoulder ABduction 149 Degrees    Right Shoulder Internal Rotation 85 Degrees   FIR to T10 - painful   Right Shoulder External Rotation 75 Degrees   FER to T3 - pain in anteiror shoulder (ant deltoid)   Left Shoulder Flexion 152 Degrees    Left Shoulder ABduction 158 Degrees    Left Shoulder Internal Rotation --   FIR to T7   Left Shoulder External Rotation --   FER to T3     Strength   Right Shoulder Flexion 4+/5    Right Shoulder ABduction 4/5    Right Shoulder Internal Rotation 4/5  Right Shoulder External Rotation 4/5    Left Shoulder Flexion 5/5    Left Shoulder ABduction 5/5    Left Shoulder Internal Rotation 5/5    Left Shoulder External Rotation 5/5                         [facility] Adult PT Treatment/Exercise - 04/22/20 1406      Shoulder Exercises: ROM/Strengthening   UBE (Upper Arm Bike) L1.5 x 6 min (3' fwd/3' back)      Manual Therapy   Manual Therapy Soft tissue mobilization;Myofascial release;Scapular mobilization    Manual therapy comments supine & L side lying    Soft tissue mobilization STM & gentle DTM to R pecs, deltoids, biceps, triceps and posterior shoulder complex    Myofascial Release manual TPR to R anterior & lateral deltoids    Scapular Mobilization R scapula - retraction & depression                    PT Short Term Goals - 03/22/20 1448      PT SHORT TERM GOAL #1   Title Patient will be independent with initial HEP    Status Achieved   03/22/20            PT Long  Term Goals - 04/22/20 1440      PT LONG TERM GOAL #1   Title Patient will be independent with ongoing/advanced HEP    Status Partially Met    Target Date 06/04/20      PT LONG TERM GOAL #2   Title Patient to demonstrate ability to achieve and maintain good R shoulder alignment/posturing to improve GHJ mechancis    Status Partially Met    Target Date 06/04/20      PT LONG TERM GOAL #3   Title Patient to improve R shoulder AROM to [facility] without pain provocation    Status Partially Met    Target Date 06/04/20      PT LONG TERM GOAL #4   Title Patient will demonstrate improved R shoulder strength to >/= 4/5 for functional UE use    Status Achieved   04/22/20     PT LONG TERM GOAL #5   Title Patient to report ability to perform ADLs and daily household chores and activities without R shoulder pain provocation    Status Partially Met    Target Date 06/04/20                 Plan - 04/22/20 1414    Clinical Impression Statement Diana George continues to demonstrate good progress with PT with improving R shoulder AROM nearing equivalency with L, although still with some pain/restriction at end ROM. R shoulder strength has improved from 3-/5 on eval to 4/5 for all motions exception flexion (4+/5). She reports improved postural awareness and has been able to better use R arm functionally with typical daily tasks although still experiences mild pain with some motions or activities such vacuuming. STG goal met with majority of LTGs now partially met. Diana George will benefit from continued skilled PT to further address pain, ROM and strength deficits to allow for restoration of normal functional use of R UE.    Personal Factors and Comorbidities Comorbidity 3+;Fitness;Time since onset of injury/illness/exacerbation    Comorbidities DM, OSA, anxiety and depression, h/o R meniscal tear, L wrist fx, L tibia/ankle fx    Examination-Activity Limitations Bathing;Bed Mobility;Caring for  Others;Carry;Lift;Dressing;Hygiene/Grooming;Reach Overhead    Examination-Participation Restrictions  Church;Cleaning;Community Activity;Driving;Laundry;Meal Prep;Shop;Yard Work    Rehab Potential Good    PT Frequency 2x / week    PT Duration 6 weeks    PT Treatment/Interventions ADLs/Self Care Home Management;Cryotherapy;Electrical Stimulation;Iontophoresis 84m/ml Dexamethasone;Moist Heat;Ultrasound;Functional mobility training;Therapeutic activities;Therapeutic exercise;Neuromuscular re-education;Patient/family education;Manual techniques;Passive range of motion;Dry needling;Taping;Vasopneumatic Device;Joint Manipulations    PT Next Visit Plan Continue pain free R shoulder AA/AROM and strengthening, updating HEP as appropriate; manual therapy to address increased muscle tension/ttp; modalities PRN for pain    PT Home Exercise Plan 5/13 - UT, LS and low doorway pec stretches, scap retraction; 5/20 - low/mid single arm doorway pec stretch; 5/24 - yellow TB rows/retraction, shoulder isometrics; 6/21 - yellow TB scap retraction + hooklying horiz ABD & seated ER    Consulted and Agree with Plan of Care Patient           Patient will benefit from skilled therapeutic intervention in order to improve the following deficits and impairments:  Decreased activity tolerance, Decreased mobility, Decreased range of motion, Decreased strength, Increased fascial restricitons, Increased muscle spasms, Impaired perceived functional ability, Impaired flexibility, Impaired UE functional use, Postural dysfunction, Improper body mechanics, Pain  Visit Diagnosis: Acute pain of right shoulder  Stiffness of right shoulder, not elsewhere classified  Muscle weakness (generalized)  Abnormal posture  Other symptoms and signs involving the musculoskeletal system     Problem List Patient Active Problem List   Diagnosis Date Noted  . OSA (obstructive sleep apnea) 04/13/2014  . Allergic rhinitis 09/29/2012  .  Situational stress 09/29/2012  . Hyperlipidemia 06/16/2011  . Diabetes mellitus ([place] 06/16/2011  . Anxiety and depression 06/16/2011    JPercival Spanish PT, MPT 04/22/2020, 7:19 PM  [facility] [place] [address]Phone: [phone]  Fax:  [phone] Name: SRYLEE HUESTISMRN: [id number] of Birth: [birth year]

## 2020-04-27 ENCOUNTER — Other Ambulatory Visit: Payer: Self-pay

## 2020-04-27 ENCOUNTER — Ambulatory Visit: Payer: Medicare Other | Admitting: Physical Therapy

## 2020-04-27 ENCOUNTER — Encounter: Payer: Self-pay | Admitting: Physical Therapy

## 2020-04-27 DIAGNOSIS — R293 Abnormal posture: Secondary | ICD-10-CM

## 2020-04-27 DIAGNOSIS — R29898 Other symptoms and signs involving the musculoskeletal system: Secondary | ICD-10-CM

## 2020-04-27 DIAGNOSIS — M25611 Stiffness of right shoulder, not elsewhere classified: Secondary | ICD-10-CM

## 2020-04-27 DIAGNOSIS — M6281 Muscle weakness (generalized): Secondary | ICD-10-CM

## 2020-04-27 DIAGNOSIS — M25511 Pain in right shoulder: Secondary | ICD-10-CM

## 2020-04-27 NOTE — Therapy (Signed)
[facility] [address]  [place], [place], [address] Phone: [phone]   Fax:  [phone]  Physical Therapy Treatment  Patient Details  Name: Diana George MRN: [id number] Date of Birth: 13-Feb-1946 Referring Provider (PT): Emeline General, MD   Encounter Date: 04/27/2020   PT End of Session - 04/27/20 0802    Visit Number 11    Number of Visits 20    Date for PT Re-Evaluation 06/04/20    Authorization Type BCBS Medicare    PT Start Time 0802    PT Stop Time 0854    PT Time Calculation (min) 52 min    Activity Tolerance Patient tolerated treatment well    Behavior During Therapy [facility] for tasks assessed/performed           Past Medical History:  Diagnosis Date  . Anxiety   . Depression   . Diabetes mellitus   . History of coronary rotational ablation 2006  . Hyperlipidemia   . Renal stone   . Sleep apnea    uses cpap    Past Surgical History:  Procedure Laterality Date  . CATARACT EXTRACTION W/ INTRAOCULAR LENS  IMPLANT, BILATERAL  2010  . heart ablation  2010  . LEG SURGERY Left 2011   fracture with hardware  . STONE EXTRACTION WITH BASKET  2016, 1977  . TUBAL LIGATION  1977    There were no vitals filed for this visit.   Subjective Assessment - 04/27/20 0806    Subjective Pt reports increased pain disrupting her sleep Thursday night. Tried placing her arm on a pillow the next night which seemed to help.    Diagnostic tests X-ray 02/05/20: No evidence of displaced rib fracture or pneumothorax. Right calcific supraspinatus tendinosis.    Patient Stated Goals "R arm to stop hurting and be stronger"    Currently in Pain? Yes    Pain Score 2     Pain Location Arm    Pain Orientation Right;Upper    Pain Descriptors / Indicators Sharp    Pain Type Acute pain    Pain Frequency Intermittent                             [facility] Adult PT Treatment/Exercise - 04/27/20 0802       Exercises   Exercises Shoulder      Shoulder Exercises: Supine   Protraction Both;10 reps;AROM;Strengthening;Theraband    Theraband Level (Shoulder Protraction) Level 1 (Yellow)    Protraction Weight (lbs) + horiz ABD isometric    Horizontal ABduction Both;10 reps;Strengthening;Theraband    Theraband Level (Shoulder Horizontal ABduction) Level 1 (Yellow)    Flexion Both;10 reps;AROM;Strengthening;Theraband    Theraband Level (Shoulder Flexion) Level 1 (Yellow)    Shoulder Flexion Weight (lbs) + horiz ABD isometric    Other Supine Exercises R shoulder CW/CCW circles at 90 flexion 1# x 10 each way      Shoulder Exercises: Sidelying   External Rotation Right;10 reps;AROM    External Rotation Limitations PT initially guiding motion to promote increased scap retraction + depression with pt able to take over motion in pain free range    ABduction Right;10 reps;AROM    ABduction Limitations 20-100 dg - PT initially guiding motion to promote proper GHJ rhythm with pt able to take over motion in pain free range    Other Sidelying Exercises R shoulder CW/CCW circles at 90 scaption/abduction 1#  x 10 each way      Shoulder Exercises: ROM/Strengthening   UBE (Upper Arm Bike) L1.5 x 6 min (3' fwd/3' back)    Cybex Row 10 reps    Cybex Row Limitations 10# - cues for scap retraction & depression      Modalities   Modalities Vasopneumatic      Vasopneumatic   Number Minutes Vasopneumatic  10 minutes    Vasopnuematic Location  Shoulder   Rt   Vasopneumatic Pressure Low    Vasopneumatic Temperature  34                    PT Short Term Goals - 03/22/20 1448      PT SHORT TERM GOAL #1   Title Patient will be independent with initial HEP    Status Achieved   03/22/20            PT Long Term Goals - 04/22/20 1440      PT LONG TERM GOAL #1   Title Patient will be independent with ongoing/advanced HEP    Status Partially Met    Target Date 06/04/20      PT LONG TERM GOAL #2     Title Patient to demonstrate ability to achieve and maintain good R shoulder alignment/posturing to improve GHJ mechancis    Status Partially Met    Target Date 06/04/20      PT LONG TERM GOAL #3   Title Patient to improve R shoulder AROM to [facility] without pain provocation    Status Partially Met    Target Date 06/04/20      PT LONG TERM GOAL #4   Title Patient will demonstrate improved R shoulder strength to >/= 4/5 for functional UE use    Status Achieved   04/22/20     PT LONG TERM GOAL #5   Title Patient to report ability to perform ADLs and daily household chores and activities without R shoulder pain provocation    Status Partially Met    Target Date 06/04/20                 Plan - 04/27/20 0809    Clinical Impression Statement Diana George reports pain remains "intermittent" but notes a "bad night" Thursday night where the pain kept her awake a good part of the night, but only mild occasional pain this morning. Therapeutic exercises focusing on AROM/strengthening in pain free ROM with PT providing VC and TC for scapular activation and proper GHJ rhythm. Able to progress theraband resistance with rows & scap retraction/shoulder extension to red TB with good control and no increased pain on rows but slight discomfort with fatigue for latter. Pt admitting to avoiding exercises when in pain at home, therefore encouraged pt to try exercises even if painful but use pain as guide to stop exercises if pain increases/worsens.    Personal Factors and Comorbidities Comorbidity 3+;Fitness;Time since onset of injury/illness/exacerbation    Comorbidities DM, OSA, anxiety and depression, h/o R meniscal tear, L wrist fx, L tibia/ankle fx    Examination-Activity Limitations Bathing;Bed Mobility;Caring for Others;Carry;Lift;Dressing;Hygiene/Grooming;Reach Overhead    Examination-Participation Restrictions Church;Cleaning;Community Activity;Driving;Laundry;Meal Prep;Shop;Yard Work    Rehab Potential  Good    PT Frequency 2x / week    PT Duration 6 weeks    PT Treatment/Interventions ADLs/Self Care Home Management;Cryotherapy;Electrical Stimulation;Iontophoresis '4mg'$ /ml Dexamethasone;Moist Heat;Ultrasound;Functional mobility training;Therapeutic activities;Therapeutic exercise;Neuromuscular re-education;Patient/family education;Manual techniques;Passive range of motion;Dry needling;Taping;Vasopneumatic Device;Joint Manipulations    PT Next Visit Plan Continue pain free  R shoulder AA/AROM and strengthening, updating HEP as appropriate; manual therapy to address increased muscle tension/ttp; modalities PRN for pain    PT Home Exercise Plan 5/13 - UT, LS and low doorway pec stretches, scap retraction; 5/20 - low/mid single arm doorway pec stretch; 5/24 - yellow TB rows/retraction, shoulder isometrics; 6/21 - yellow TB scap retraction + hooklying horiz ABD & seated ER    Consulted and Agree with Plan of Care Patient           Patient will benefit from skilled therapeutic intervention in order to improve the following deficits and impairments:  Decreased activity tolerance, Decreased mobility, Decreased range of motion, Decreased strength, Increased fascial restricitons, Increased muscle spasms, Impaired perceived functional ability, Impaired flexibility, Impaired UE functional use, Postural dysfunction, Improper body mechanics, Pain  Visit Diagnosis: Acute pain of right shoulder  Stiffness of right shoulder, not elsewhere classified  Muscle weakness (generalized)  Abnormal posture  Other symptoms and signs involving the musculoskeletal system     Problem List Patient Active Problem List   Diagnosis Date Noted  . OSA (obstructive sleep apnea) 04/13/2014  . Allergic rhinitis 09/29/2012  . Situational stress 09/29/2012  . Hyperlipidemia 06/16/2011  . Diabetes mellitus ([place]) 06/16/2011  . Anxiety and depression 06/16/2011    Percival Spanish, PT, MPT 04/27/2020, 1:17 PM  [facility] [address]  [place], [place], [address] Phone: [phone]   Fax:  [phone]  Name: Diana George MRN: [id number] Date of Birth: 01-26-1946

## 2020-05-07 ENCOUNTER — Ambulatory Visit: Payer: Medicare Other | Admitting: Physical Therapy

## 2020-05-10 ENCOUNTER — Encounter: Payer: Self-pay | Admitting: Physical Therapy

## 2020-05-10 ENCOUNTER — Other Ambulatory Visit: Payer: Self-pay

## 2020-05-10 ENCOUNTER — Ambulatory Visit: Payer: Medicare Other | Attending: Internal Medicine | Admitting: Physical Therapy

## 2020-05-10 DIAGNOSIS — M25611 Stiffness of right shoulder, not elsewhere classified: Secondary | ICD-10-CM | POA: Diagnosis not present

## 2020-05-10 DIAGNOSIS — R293 Abnormal posture: Secondary | ICD-10-CM | POA: Insufficient documentation

## 2020-05-10 DIAGNOSIS — R29898 Other symptoms and signs involving the musculoskeletal system: Secondary | ICD-10-CM

## 2020-05-10 DIAGNOSIS — M25511 Pain in right shoulder: Secondary | ICD-10-CM | POA: Insufficient documentation

## 2020-05-10 DIAGNOSIS — M6281 Muscle weakness (generalized): Secondary | ICD-10-CM | POA: Insufficient documentation

## 2020-05-10 NOTE — Patient Instructions (Signed)
    Home exercise program created by Isidor Bromell, PT.  For questions, please contact Amna Welker via phone at [phone] or email at [email or web address]  [facility] [address]  [address] [place], [place], [address] Phone: [phone]   Fax:  [phone]    

## 2020-05-10 NOTE — Therapy (Signed)
[facility] [address]  [place], [place], [address] Phone: [phone]   Fax:  [phone]  Physical Therapy Treatment  Patient Details  Name: Diana George MRN: [id number] Date of Birth: [birth year] Referring Provider (PT): Emeline General, MD   Encounter Date: 05/10/2020   PT End of Session - 05/10/20 1451    Visit Number 12    Number of Visits 20    Date for PT Re-Evaluation 06/04/20    Authorization Type BCBS Medicare    PT Start Time 9628    PT Stop Time 1529    PT Time Calculation (min) 38 min    Activity Tolerance Patient tolerated treatment well    Behavior During Therapy [facility] for tasks assessed/performed           Past Medical History:  Diagnosis Date   Anxiety    Depression    Diabetes mellitus    History of coronary rotational ablation 2006   Hyperlipidemia    Renal stone    Sleep apnea    uses cpap    Past Surgical History:  Procedure Laterality Date   CATARACT EXTRACTION W/ INTRAOCULAR LENS  IMPLANT, BILATERAL  2010   heart ablation  2010   LEG SURGERY Left 2011   fracture with hardware   STONE EXTRACTION WITH BASKET  2016, [place]    There were no vitals filed for this visit.   Subjective Assessment - 05/10/20 1453    Subjective Pt reports pain has been better, primarily only noting pain when she gets up in the morning which typically resolves once she gets moving. Pain does seem to disrupt sleep.    Diagnostic tests X-ray 02/05/20: No evidence of displaced rib fracture or pneumothorax. Right calcific supraspinatus tendinosis.    Patient Stated Goals "R arm to stop hurting and be stronger"    Currently in Pain? No/denies                             [facility] Adult PT Treatment/Exercise - 05/10/20 1451      Exercises   Exercises Shoulder      Shoulder Exercises: Standing   External Rotation Right;10 reps;Strengthening;Theraband     Theraband Level (Shoulder External Rotation) Level 1 (Yellow)    External Rotation Limitations neutral shoulder with towel under elbow - cues for scap retraction    Internal Rotation Right;10 reps;Strengthening;Theraband    Theraband Level (Shoulder Internal Rotation) Level 1 (Yellow)    Internal Rotation Limitations neutral shoulder with towel under elbow - cues to avoid fwd rounding of shoulder    Diagonals Right;10 reps;Strengthening;Theraband    Theraband Level (Shoulder Diagonals) Level 1 (Yellow)    Diagonals Limitations D1/D2 flexion/extension      Shoulder Exercises: Therapy Ball   Flexion Both;10 reps    Flexion Limitations orange Pball on wall + 1# R hand lift-off      Shoulder Exercises: ROM/Strengthening   UBE (Upper Arm Bike) L2.0 x 6 min (3' fwd/3' back)    Wall Pushups 10 reps   2 sets   Wall Pushups Limitations 2nd set with pushup plus    Ball on Wall R shoulder at 90 flexion & 90 scaption/abduciton - CW/CW circles x 10 each & (flexion only) ABCs x 1 set                   PT  Education - 05/10/20 1528    Education Details HEP update - yellow TB shoulder IR/ER, wall pushups (discontinued isometrics)    Person(s) Educated Patient    Methods Explanation;Demonstration;Verbal cues;Handout    Comprehension Verbalized understanding;Returned demonstration;Verbal cues required;Need further instruction            PT Short Term Goals - 03/22/20 1448      PT SHORT TERM GOAL #1   Title Patient will be independent with initial HEP    Status Achieved   03/22/20            PT Long Term Goals - 04/22/20 1440      PT LONG TERM GOAL #1   Title Patient will be independent with ongoing/advanced HEP    Status Partially Met    Target Date 06/04/20      PT LONG TERM GOAL #2   Title Patient to demonstrate ability to achieve and maintain good R shoulder alignment/posturing to improve GHJ mechancis    Status Partially Met    Target Date 06/04/20      PT LONG TERM  GOAL #3   Title Patient to improve R shoulder AROM to [facility] without pain provocation    Status Partially Met    Target Date 06/04/20      PT LONG TERM GOAL #4   Title Patient will demonstrate improved R shoulder strength to >/= 4/5 for functional UE use    Status Achieved   04/22/20     PT LONG TERM GOAL #5   Title Patient to report ability to perform ADLs and daily household chores and activities without R shoulder pain provocation    Status Partially Met    Target Date 06/04/20                 Plan - 05/10/20 1457    Clinical Impression Statement Diana George is cautiously optimistic today reporting no recent shoulder pain other than initially upon rising in the morning which typically resolves once she gets moving. Able to tolerate progression to RTC and functional strengthening with no increased pain today, but close monitoring and cueing necessary to ensure proper movement patterns and neutral shoulder alignment. HEP updated to include yellow TB shoulder IR/ER and wall pushups while discontinuing shoulder isometrics.    Personal Factors and Comorbidities Comorbidity 3+;Fitness;Time since onset of injury/illness/exacerbation    Comorbidities DM, OSA, anxiety and depression, h/o R meniscal tear, L wrist fx, L tibia/ankle fx    Examination-Activity Limitations Bathing;Bed Mobility;Caring for Others;Carry;Lift;Dressing;Hygiene/Grooming;Reach Overhead    Examination-Participation Restrictions Church;Cleaning;Community Activity;Driving;Laundry;Meal Prep;Shop;Yard Work    Rehab Potential Good    PT Frequency 2x / week    PT Duration 6 weeks    PT Treatment/Interventions ADLs/Self Care Home Management;Cryotherapy;Electrical Stimulation;Iontophoresis '4mg'$ /ml Dexamethasone;Moist Heat;Ultrasound;Functional mobility training;Therapeutic activities;Therapeutic exercise;Neuromuscular re-education;Patient/family education;Manual techniques;Passive range of motion;Dry needling;Taping;Vasopneumatic  Device;Joint Manipulations    PT Next Visit Plan Continue pain free R shoulder AROM and strengthening, updating HEP as appropriate; manual therapy to address increased muscle tension/ttp; modalities PRN for pain    PT Home Exercise Plan 5/13 - UT, LS and low doorway pec stretches, scap retraction; 5/20 - low/mid single arm doorway pec stretch; 5/24 - yellow TB rows/retraction, shoulder isometrics; 6/21 - yellow TB scap retraction + hooklying horiz ABD & seated ER; 7/12 - yellow TB shoulder IR/ER, wall pushups (discontinued isometrics)    Consulted and Agree with Plan of Care Patient           Patient will benefit from skilled  therapeutic intervention in order to improve the following deficits and impairments:  Decreased activity tolerance, Decreased mobility, Decreased range of motion, Decreased strength, Increased fascial restricitons, Increased muscle spasms, Impaired perceived functional ability, Impaired flexibility, Impaired UE functional use, Postural dysfunction, Improper body mechanics, Pain  Visit Diagnosis: Acute pain of right shoulder  Stiffness of right shoulder, not elsewhere classified  Muscle weakness (generalized)  Abnormal posture  Other symptoms and signs involving the musculoskeletal system     Problem List Patient Active Problem List   Diagnosis Date Noted   OSA (obstructive sleep apnea) 04/13/2014   Allergic rhinitis 09/29/2012   Situational stress 09/29/2012   Hyperlipidemia 06/16/2011   Diabetes mellitus ([place]) 06/16/2011   Anxiety and depression 06/16/2011    Percival Spanish, PT, MPT 05/10/2020, 4:02 PM  [facility] [place] [facility] [address]  [place], [place], [address] Phone: [phone]   Fax:  [phone]  Name: Diana George MRN: [id number] Date of Birth: 1946-09-17

## 2020-05-12 ENCOUNTER — Ambulatory Visit: Payer: Medicare Other

## 2020-05-12 ENCOUNTER — Other Ambulatory Visit: Payer: Self-pay

## 2020-05-12 DIAGNOSIS — M25611 Stiffness of right shoulder, not elsewhere classified: Secondary | ICD-10-CM | POA: Diagnosis not present

## 2020-05-12 DIAGNOSIS — M25511 Pain in right shoulder: Secondary | ICD-10-CM

## 2020-05-12 DIAGNOSIS — M6281 Muscle weakness (generalized): Secondary | ICD-10-CM | POA: Diagnosis not present

## 2020-05-12 DIAGNOSIS — R293 Abnormal posture: Secondary | ICD-10-CM

## 2020-05-12 DIAGNOSIS — R29898 Other symptoms and signs involving the musculoskeletal system: Secondary | ICD-10-CM

## 2020-05-12 NOTE — Therapy (Signed)
[facility] [address]  [place], [place], [address] Phone: [phone]   Fax:  [phone]  Physical Therapy Treatment  Patient Details  Name: Diana George MRN: [id number] Date of Birth: [birth year] Referring Provider (PT): Emeline General, MD   Encounter Date: 05/12/2020   PT End of Session - 05/12/20 1509    Visit Number 13    Number of Visits 20    Date for PT Re-Evaluation 06/04/20    Authorization Type BCBS Medicare    PT Start Time 1450    PT Stop Time 1537    PT Time Calculation (min) 47 min    Activity Tolerance Patient tolerated treatment well    Behavior During Therapy [facility] for tasks assessed/performed           Past Medical History:  Diagnosis Date  . Anxiety   . Depression   . Diabetes mellitus   . History of coronary rotational ablation 2006  . Hyperlipidemia   . Renal stone   . Sleep apnea    uses cpap    Past Surgical History:  Procedure Laterality Date  . CATARACT EXTRACTION W/ INTRAOCULAR LENS  IMPLANT, BILATERAL  2010  . heart ablation  2010  . LEG SURGERY Left 2011   fracture with hardware  . STONE EXTRACTION WITH BASKET  2016, 1977  . TUBAL LIGATION  1977    There were no vitals filed for this visit.   Subjective Assessment - 05/12/20 1458    Subjective Doing ok.    Diagnostic tests X-ray 02/05/20: No evidence of displaced rib fracture or pneumothorax. Right calcific supraspinatus tendinosis.    Patient Stated Goals "R arm to stop hurting and be stronger"    Currently in Pain? Yes    Pain Score 1     Pain Location Arm    Pain Orientation Right    Pain Descriptors / Indicators Aching    Pain Type Acute pain    Pain Onset More than a month ago    Pain Frequency Intermittent    Multiple Pain Sites No                             [facility] Adult PT Treatment/Exercise - 05/12/20 0001      Shoulder Exercises: Sidelying   ABduction Right;15 reps;AROM     ABduction Limitations 20-110 dg     Other Sidelying Exercises R horizontal abduction x 15 rpes       Shoulder Exercises: Standing   External Rotation Right;10 reps;Strengthening;Theraband   cueing for proper motion    Theraband Level (Shoulder External Rotation) Level 1 (Yellow)    Internal Rotation Right;10 reps;Strengthening;Theraband   cueing for proper motion    Theraband Level (Shoulder Internal Rotation) Level 1 (Yellow)      Shoulder Exercises: ROM/Strengthening   UBE (Upper Arm Bike) L2.0 x 6 min (3' fwd/3' back)      Shoulder Exercises: Stretch   Corner Stretch 30 seconds;2 reps   unable to feel an appropriate stretch on doorframe    Corner Stretch Limitations corner       Manual Therapy   Manual Therapy Soft tissue mobilization;Passive ROM    Manual therapy comments supine     Soft tissue mobilization STM to R biceps     Passive ROM Manual R pec strech  PT Short Term Goals - 03/22/20 1448      PT SHORT TERM GOAL #1   Title Patient will be independent with initial HEP    Status Achieved   03/22/20            PT Long Term Goals - 04/22/20 1440      PT LONG TERM GOAL #1   Title Patient will be independent with ongoing/advanced HEP    Status Partially Met    Target Date 06/04/20      PT LONG TERM GOAL #2   Title Patient to demonstrate ability to achieve and maintain good R shoulder alignment/posturing to improve GHJ mechancis    Status Partially Met    Target Date 06/04/20      PT LONG TERM GOAL #3   Title Patient to improve R shoulder AROM to [facility] without pain provocation    Status Partially Met    Target Date 06/04/20      PT LONG TERM GOAL #4   Title Patient will demonstrate improved R shoulder strength to >/= 4/5 for functional UE use    Status Achieved   04/22/20     PT LONG TERM GOAL #5   Title Patient to report ability to perform ADLs and daily household chores and activities without R shoulder pain provocation    Status  Partially Met    Target Date 06/04/20                 Plan - 05/12/20 1510    Clinical Impression Statement Diana George reporting some muscular soreness after last session which subsided next day.  Lifted a heavy box yesterday afternoon which irritated her R biceps.  MT addressed increased tension in R biceps and shoulder with good response.  Pt. able to perform RTC strengthening and postural strengthening activities in session however did require instruction in proper positioning for standing IR, ER band activities with improved technique following instruction.    Comorbidities DM, OSA, anxiety and depression, h/o R meniscal tear, L wrist fx, L tibia/ankle fx    Rehab Potential Good    PT Frequency 2x / week    PT Treatment/Interventions ADLs/Self Care Home Management;Cryotherapy;Electrical Stimulation;Iontophoresis 47m/ml Dexamethasone;Moist Heat;Ultrasound;Functional mobility training;Therapeutic activities;Therapeutic exercise;Neuromuscular re-education;Patient/family education;Manual techniques;Passive range of motion;Dry needling;Taping;Vasopneumatic Device;Joint Manipulations    PT Next Visit Plan Continue pain free R shoulder AROM and strengthening, updating HEP as appropriate; manual therapy to address increased muscle tension/ttp; modalities PRN for pain    PT Home Exercise Plan 5/13 - UT, LS and low doorway pec stretches, scap retraction; 5/20 - low/mid single arm doorway pec stretch; 5/24 - yellow TB rows/retraction, shoulder isometrics; 6/21 - yellow TB scap retraction + hooklying horiz ABD & seated ER; 7/12 - yellow TB shoulder IR/ER, wall pushups (discontinued isometrics)    Consulted and Agree with Plan of Care Patient           Patient will benefit from skilled therapeutic intervention in order to improve the following deficits and impairments:  Decreased activity tolerance, Decreased mobility, Decreased range of motion, Decreased strength, Increased fascial restricitons,  Increased muscle spasms, Impaired perceived functional ability, Impaired flexibility, Impaired UE functional use, Postural dysfunction, Improper body mechanics, Pain  Visit Diagnosis: Acute pain of right shoulder  Stiffness of right shoulder, not elsewhere classified  Muscle weakness (generalized)  Abnormal posture  Other symptoms and signs involving the musculoskeletal system     Problem List Patient Active Problem List   Diagnosis Date Noted  .  OSA (obstructive sleep apnea) 04/13/2014  . Allergic rhinitis 09/29/2012  . Situational stress 09/29/2012  . Hyperlipidemia 06/16/2011  . Diabetes mellitus ([place]) 06/16/2011  . Anxiety and depression 06/16/2011    Bess Harvest, PTA 05/12/20 3:54 PM   [facility] [address]  [place], [place], [address] Phone: [phone]   Fax:  [phone]  Name: Diana George MRN: [id number] Date of Birth: 1946-01-03

## 2020-05-13 ENCOUNTER — Ambulatory Visit: Payer: Medicare Other

## 2020-05-14 ENCOUNTER — Other Ambulatory Visit: Payer: Self-pay

## 2020-05-14 ENCOUNTER — Other Ambulatory Visit: Payer: Medicare Other | Admitting: Internal Medicine

## 2020-05-14 DIAGNOSIS — N1831 Chronic kidney disease, stage 3a: Secondary | ICD-10-CM | POA: Diagnosis not present

## 2020-05-14 DIAGNOSIS — E782 Mixed hyperlipidemia: Secondary | ICD-10-CM

## 2020-05-14 DIAGNOSIS — E119 Type 2 diabetes mellitus without complications: Secondary | ICD-10-CM

## 2020-05-14 DIAGNOSIS — G4733 Obstructive sleep apnea (adult) (pediatric): Secondary | ICD-10-CM

## 2020-05-14 DIAGNOSIS — K580 Irritable bowel syndrome with diarrhea: Secondary | ICD-10-CM

## 2020-05-14 DIAGNOSIS — Z0000 Encounter for general adult medical examination without abnormal findings: Secondary | ICD-10-CM

## 2020-05-15 LAB — CBC WITH DIFFERENTIAL/PLATELET
Absolute Monocytes: 475 cells/uL (ref 200–950)
Basophils Absolute: 53 cells/uL (ref 0–200)
Basophils Relative: 0.8 %
Eosinophils Absolute: 290 cells/uL (ref 15–500)
Eosinophils Relative: 4.4 %
HCT: 39.5 % (ref 35.0–45.0)
Hemoglobin: 12.9 g/dL (ref 11.7–15.5)
Lymphs Abs: 2059 cells/uL (ref 850–3900)
MCH: 31.4 pg (ref 27.0–33.0)
MCHC: 32.7 g/dL (ref 32.0–36.0)
MCV: 96.1 fL (ref 80.0–100.0)
MPV: 12 fL (ref 7.5–12.5)
Monocytes Relative: 7.2 %
Neutro Abs: 3722 cells/uL (ref 1500–7800)
Neutrophils Relative %: 56.4 %
Platelets: 303 10*3/uL (ref 140–400)
RBC: 4.11 10*6/uL (ref 3.80–5.10)
RDW: 12.2 % (ref 11.0–15.0)
Total Lymphocyte: 31.2 %
WBC: 6.6 10*3/uL (ref 3.8–10.8)

## 2020-05-15 LAB — COMPLETE METABOLIC PANEL WITH GFR
AG Ratio: 2 (calc) (ref 1.0–2.5)
ALT: 14 U/L (ref 6–29)
AST: 16 U/L (ref 10–35)
Albumin: 4.2 g/dL (ref 3.6–5.1)
Alkaline phosphatase (APISO): 63 U/L (ref 37–153)
BUN/Creatinine Ratio: 19 (calc) (ref 6–22)
BUN: 18 mg/dL (ref 7–25)
CO2: 24 mmol/L (ref 20–32)
Calcium: 9.2 mg/dL (ref 8.6–10.4)
Chloride: 108 mmol/L (ref 98–110)
Creat: 0.96 mg/dL — ABNORMAL HIGH (ref 0.60–0.93)
GFR, Est African American: 68 mL/min/{1.73_m2} (ref >=60)
GFR, Est Non African American: 59 mL/min/{1.73_m2} — ABNORMAL LOW (ref >=60)
Globulin: 2.1 g/dL (calc) (ref 1.9–3.7)
Glucose, Bld: 141 mg/dL — ABNORMAL HIGH (ref 65–99)
Potassium: 4.5 mmol/L (ref 3.5–5.3)
Sodium: 142 mmol/L (ref 135–146)
Total Bilirubin: 0.6 mg/dL (ref 0.2–1.2)
Total Protein: 6.3 g/dL (ref 6.1–8.1)

## 2020-05-15 LAB — LIPID PANEL
Cholesterol: 144 mg/dL (ref <200)
HDL: 53 mg/dL (ref >=50)
LDL Cholesterol (Calc): 72 mg/dL (calc)
Non-HDL Cholesterol (Calc): 91 mg/dL (calc) (ref <130)
Total CHOL/HDL Ratio: 2.7 (calc) (ref <5.0)
Triglycerides: 104 mg/dL (ref <150)

## 2020-05-15 LAB — HEMOGLOBIN A1C
Hgb A1c MFr Bld: 6.3 % of total Hgb — ABNORMAL HIGH (ref <5.7)
Mean Plasma Glucose: 134 (calc)
eAG (mmol/L): 7.4 (calc)

## 2020-05-15 LAB — TSH: TSH: 2.34 mIU/L (ref 0.40–4.50)

## 2020-05-17 ENCOUNTER — Ambulatory Visit: Payer: Medicare Other

## 2020-05-17 ENCOUNTER — Other Ambulatory Visit: Payer: Self-pay

## 2020-05-17 DIAGNOSIS — M25611 Stiffness of right shoulder, not elsewhere classified: Secondary | ICD-10-CM

## 2020-05-17 DIAGNOSIS — M25511 Pain in right shoulder: Secondary | ICD-10-CM | POA: Diagnosis not present

## 2020-05-17 DIAGNOSIS — R29898 Other symptoms and signs involving the musculoskeletal system: Secondary | ICD-10-CM

## 2020-05-17 DIAGNOSIS — M6281 Muscle weakness (generalized): Secondary | ICD-10-CM | POA: Diagnosis not present

## 2020-05-17 DIAGNOSIS — R293 Abnormal posture: Secondary | ICD-10-CM

## 2020-05-17 NOTE — Therapy (Signed)
[facility] [address]  [place], [place], [address] Phone: [phone]   Fax:  [phone]  Physical Therapy Treatment  Patient Details  Name: Diana George MRN: [id number] Date of Birth: April 26, 1946 Referring Provider (PT): Emeline General, MD   Encounter Date: 05/17/2020   PT End of Session - 05/17/20 1458    Visit Number 14    Number of Visits 20    Date for PT Re-Evaluation 06/04/20    Authorization Type BCBS Medicare    PT Start Time 1696    PT Stop Time 1529    PT Time Calculation (min) 38 min    Activity Tolerance Patient tolerated treatment well    Behavior During Therapy [facility] for tasks assessed/performed           Past Medical History:  Diagnosis Date  . Anxiety   . Depression   . Diabetes mellitus   . History of coronary rotational ablation 2006  . Hyperlipidemia   . Renal stone   . Sleep apnea    uses cpap    Past Surgical History:  Procedure Laterality Date  . CATARACT EXTRACTION W/ INTRAOCULAR LENS  IMPLANT, BILATERAL  2010  . heart ablation  2010  . LEG SURGERY Left 2011   fracture with hardware  . STONE EXTRACTION WITH BASKET  2016, 1977  . TUBAL LIGATION  1977    There were no vitals filed for this visit.   Subjective Assessment - 05/17/20 1455    Subjective Pt. seeing MD tomorrow.    Diagnostic tests X-ray 02/05/20: No evidence of displaced rib fracture or pneumothorax. Right calcific supraspinatus tendinosis.    Patient Stated Goals "R arm to stop hurting and be stronger"    Currently in Pain? No/denies    Pain Score 0-No pain   pain rising to 3-4/10 at most   Pain Location Arm    Pain Orientation Right    Pain Descriptors / Indicators Aching    Pain Type Acute pain    Pain Onset More than a month ago    Pain Frequency Intermittent    Multiple Pain Sites No              [facility] PT Assessment - 05/17/20 0001      AROM   AROM Assessment Site Shoulder    Right/Left  Shoulder Right    Right Shoulder Flexion 155 Degrees    Right Shoulder ABduction 151 Degrees   lateral shoulder pain at end ROM    Right Shoulder Internal Rotation --   T10 - pain free    Right Shoulder External Rotation --   FER T4 - pain free      Strength   Strength Assessment Site Shoulder    Right/Left Shoulder Right;Left    Right Shoulder Flexion 4+/5    Right Shoulder ABduction 4+/5    Right Shoulder Internal Rotation 4/5    Right Shoulder External Rotation 4/5    Left Shoulder Flexion 5/5    Left Shoulder ABduction 5/5    Left Shoulder Internal Rotation 5/5    Left Shoulder External Rotation 5/5                         [facility] Adult PT Treatment/Exercise - 05/17/20 0001      Shoulder Exercises: Sidelying   ABduction Right;AROM;10 reps;Strengthening    ABduction Weight (lbs) 1    ABduction Limitations 20-110 dg  Shoulder Exercises: Standing   External Rotation Right;12 reps;Strengthening    Theraband Level (Shoulder External Rotation) Level 1 (Yellow)    Internal Rotation Right;12 reps;Strengthening;Theraband    Theraband Level (Shoulder Internal Rotation) Level 1 (Yellow)    Flexion Both;10 reps    Flexion Limitations some visible R shouldre "hiking"    ABduction Both;10 reps;Strengthening    ABduction Limitations scaption     Other Standing Exercises R shoulder reaching flexion to 3rd cabinet       Shoulder Exercises: ROM/Strengthening   UBE (Upper Arm Bike) L2.0 x 6 min (3' fwd/3' back)      Shoulder Exercises: Stretch   Internal Rotation Stretch Limitations 5" x 10 reps                   PT Education - 05/17/20 1758    Education Details HEP update; AAROM wand IR stretch behind back    Person(s) Educated Patient    Methods Explanation;Demonstration;Verbal cues;Handout    Comprehension Verbalized understanding;Returned demonstration;Verbal cues required            PT Short Term Goals - 03/22/20 1448      PT SHORT TERM GOAL #1    Title Patient will be independent with initial HEP    Status Achieved   03/22/20            PT Long Term Goals - 05/17/20 1508      PT LONG TERM GOAL #1   Title Patient will be independent with ongoing/advanced HEP    Status Partially Met      PT LONG TERM GOAL #2   Title Patient to demonstrate ability to achieve and maintain good R shoulder alignment/posturing to improve GHJ mechancis    Status Partially Met   07/19: some excessive R scapular elevation still visible at end ROM flexion     PT LONG TERM GOAL #3   Title Patient to improve R shoulder AROM to [facility] without pain provocation    Status Partially Met   07/19: limited in behind back FIR reaching to bra strap     PT LONG TERM GOAL #4   Title Patient will demonstrate improved R shoulder strength to >/= 4/5 for functional UE use    Status Achieved   04/22/20     PT LONG TERM GOAL #5   Title Patient to report ability to perform ADLs and daily household chores and activities without R shoulder pain provocation    Status Partially Met   07/19; pt. only limited now with washing windows                Plan - 05/17/20 1459    Clinical Impression Statement Pt. noting ~ 80% improvement in overall pain levels starting therapy.  Pt. with visible improvement in R scapulohumeral mechanics with elevation activities however still with some excessive scapular elevation with flexion.  LTG #2 partially achieved.  Patients R shoulder AROM progressing now most limited in behind back FIR.  LTG #3 partially achieved.  LTG #4 met as pt. able to demo 4/5-4+/5 R shoulder strength with MMT.  Notes no limitation from shoulder pain with household ADLs or chores now with exception of washing windows.  LTG #5 partially achieved.  Session focused on therex to improve scapulohumeral mechanics with intermittent therapist scapular guidance provided.  Pt. pain free with therex and progressing well toward remaining goals.  Sees MD tomorrow for f/u.     Comorbidities DM, OSA, anxiety and depression, h/o  R meniscal tear, L wrist fx, L tibia/ankle fx    Rehab Potential Good    PT Frequency 2x / week    PT Treatment/Interventions ADLs/Self Care Home Management;Cryotherapy;Electrical Stimulation;Iontophoresis '4mg'$ /ml Dexamethasone;Moist Heat;Ultrasound;Functional mobility training;Therapeutic activities;Therapeutic exercise;Neuromuscular re-education;Patient/family education;Manual techniques;Passive range of motion;Dry needling;Taping;Vasopneumatic Device;Joint Manipulations    PT Next Visit Plan Continue pain free R shoulder AROM and strengthening, updating HEP as appropriate; manual therapy to address increased muscle tension/ttp; modalities PRN for pain    PT Home Exercise Plan 5/13 - UT, LS and low doorway pec stretches, scap retraction; 5/20 - low/mid single arm doorway pec stretch; 5/24 - yellow TB rows/retraction, shoulder isometrics; 6/21 - yellow TB scap retraction + hooklying horiz ABD & seated ER; 7/12 - yellow TB shoulder IR/ER, wall pushups (discontinued isometrics); 07/19 - AAROM wand IR stretch behind back    Consulted and Agree with Plan of Care Patient           Patient will benefit from skilled therapeutic intervention in order to improve the following deficits and impairments:  Decreased activity tolerance, Decreased mobility, Decreased range of motion, Decreased strength, Increased fascial restricitons, Increased muscle spasms, Impaired perceived functional ability, Impaired flexibility, Impaired UE functional use, Postural dysfunction, Improper body mechanics, Pain  Visit Diagnosis: Acute pain of right shoulder  Stiffness of right shoulder, not elsewhere classified  Muscle weakness (generalized)  Abnormal posture  Other symptoms and signs involving the musculoskeletal system     Problem List Patient Active Problem List   Diagnosis Date Noted  . OSA (obstructive sleep apnea) 04/13/2014  . Allergic rhinitis 09/29/2012    . Situational stress 09/29/2012  . Hyperlipidemia 06/16/2011  . Diabetes mellitus ([place]) 06/16/2011  . Anxiety and depression 06/16/2011    Bess Harvest, PTA 05/17/20 6:01 PM   [facility] [address]  [place], [place], [address] Phone: [phone]   Fax:  [phone]  Name: Diana George MRN: [id number] Date of Birth: June 13, 1946

## 2020-05-18 ENCOUNTER — Encounter: Payer: Self-pay | Admitting: Internal Medicine

## 2020-05-18 ENCOUNTER — Ambulatory Visit (INDEPENDENT_AMBULATORY_CARE_PROVIDER_SITE_OTHER): Payer: Medicare Other | Admitting: Internal Medicine

## 2020-05-18 VITALS — BP 110/80 | HR 92 | Ht 65.0 in | Wt 191.0 lb

## 2020-05-18 DIAGNOSIS — N1831 Chronic kidney disease, stage 3a: Secondary | ICD-10-CM

## 2020-05-18 DIAGNOSIS — G4733 Obstructive sleep apnea (adult) (pediatric): Secondary | ICD-10-CM | POA: Diagnosis not present

## 2020-05-18 DIAGNOSIS — Z0000 Encounter for general adult medical examination without abnormal findings: Secondary | ICD-10-CM | POA: Diagnosis not present

## 2020-05-18 DIAGNOSIS — M79621 Pain in right upper arm: Secondary | ICD-10-CM | POA: Diagnosis not present

## 2020-05-18 DIAGNOSIS — F419 Anxiety disorder, unspecified: Secondary | ICD-10-CM

## 2020-05-18 DIAGNOSIS — Z87442 Personal history of urinary calculi: Secondary | ICD-10-CM

## 2020-05-18 DIAGNOSIS — M898X2 Other specified disorders of bone, upper arm: Secondary | ICD-10-CM

## 2020-05-18 DIAGNOSIS — E785 Hyperlipidemia, unspecified: Secondary | ICD-10-CM

## 2020-05-18 DIAGNOSIS — F329 Major depressive disorder, single episode, unspecified: Secondary | ICD-10-CM

## 2020-05-18 DIAGNOSIS — F32A Depression, unspecified: Secondary | ICD-10-CM

## 2020-05-18 DIAGNOSIS — W19XXXD Unspecified fall, subsequent encounter: Secondary | ICD-10-CM

## 2020-05-18 DIAGNOSIS — E1169 Type 2 diabetes mellitus with other specified complication: Secondary | ICD-10-CM

## 2020-05-18 LAB — POCT URINALYSIS DIPSTICK
Appearance: NEGATIVE
Bilirubin, UA: NEGATIVE
Blood, UA: NEGATIVE
Glucose, UA: NEGATIVE
Ketones, UA: NEGATIVE
Leukocytes, UA: NEGATIVE
Nitrite, UA: NEGATIVE
Odor: NEGATIVE
Protein, UA: NEGATIVE
Spec Grav, UA: 1.025 (ref 1.010–1.025)
Urobilinogen, UA: 0.2 E.U./dL
pH, UA: 6 (ref 5.0–8.0)

## 2020-05-18 MED ORDER — MELOXICAM 15 MG PO TABS: 15.0000 mg | ORAL_TABLET | Freq: Every day | ORAL | 1 refills | Status: DC

## 2020-05-18 NOTE — Progress Notes (Signed)
Subjective:    Patient ID: Diana George, female    DOB: 03/21/1946, 74 y.o.   MRN: [id number]  HPI  74 year old Female seen for Medicare wellness, health maintenance exam and evaluation of medical issues.  She has a history of diabetes mellitus, hyperlipidemia and anxiety depression.  Had lithotripsy for kidney stones right renal pelvis and right lower pole of kidney 02/19/15.  Abnormal Cardiolite study 02/18/01.  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  Study was done for syncope.  She had mild mitral regurgitation.  She was seen by Dr. 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  She then had a catheter ablation in September 2005.  History of chronic kidney disease followed by [facility] and stable.  History of bilateral tubal ligation 1980, stone basket extraction for kidney stones in the mid 1970s.  Fractured left wrist November 2007 due to a fall in addition to right anterior cruciate ligament tear or meniscal tear.  History of cataract extraction left eye March 2011.  She is allergic to amoxicillin-it causes a rash.  She had allergy evaluation February 18, 2001 with reaction to molds.  No food allergies.  Spirometry was normal.  She had reactions to tree pollens, house dust and dust mites.  Social history: She is divorced.  Resides alone.  She is an only child.  2 adult children.  Does not smoke or consume alcohol.  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  Family history: In 04/22/2014her father died of septicemia with history of MI, coronary artery disease and hematoma of the brain.  He was 74 years old.  In 08-22-2014her mother died with history of dementia and urinary tract infection at age 69.  She fell in her home in 02-19-23.  She struck her right rib cage and right mid back area.  Says she hit very hard when she fell.  X-rays were negative of the ribs and thoracic spine.  She subsequently went to physical  therapy and has been in physical therapy since mid May.  Now has acute pain of her right arm which has not improved despite physical therapy.  Her trunk pain has improved.  She takes meloxicam as needed.  Hgb AIC stable with history of Diabetes mellitus.  She is on Metformin 500 mg twice daily and Januvia 100 mg daily.  Hemoglobin A1c stable at 6.3%  History of hyperlipidemia treated with statin medication and lipid panel is entirely normal as are liver functions.  Longstanding history of anxiety and depression treated with Wellbutrin and Zoloft in addition to Tranxene 3.75 mg twice daily as needed.  Has had 2 Covid-19 vaccines  Colonoscopy due 02/18/25  Review of Systems -complained of pain in mid right humerus not improved with PT.     Objective:   Physical Exam BP 110/80 pulse 92, pulse ox 97% BMI 31.78 Skin: warm and dry. Nodes none.  TMs clear.  No JVD thyromegaly or carotid bruits.  Chest clear to auscultation.  Cardiac exam regular rate and rhythm.  Breasts without masses.  Abdomen soft nondistended without hepatosplenomegaly masses or tenderness.  None lower extremity edema or deformity.  Neuro intact without focal deficits.  Affect thought and judgment are normal.      Assessment & Plan:  Controlled type 2 diabetes mellitus-stable diabetic control with hemoglobin A1c 6.3%.  Fasting glucose 141.  No proteinuria.  Hyperlipidemia-stable on lipid-lowering medication  Anxiety depression-stable  on current medications  History of kidney stones  Chronic kidney disease followed by [facility].  Currently creatinine is nearly normal at 0.96  History of SVT status post catheter ablation 2005  Allergic rhinitis  Right humerus pain status post fall at home in the Spring  Plan: Continue current medications.  See orthopedist regarding right humerus pain.  Follow-up in 6 months.  Needs mammogram.  Colonoscopy is up-to-date.  Was done in 2016 with 10-year follow-up  recommended.  Subjective:   Patient presents for Medicare Annual/Subsequent preventive examination.  Review Past Medical/Family/Social: See above   Risk Factors  Current exercise habits: Not able to exercise much since I fell at home Dietary issues discussed: Low-fat low carbohydrate  Cardiac risk factors: Hyperlipidemia and diabetes  Depression Screen  (Note: if answer to either of the following is "Yes", a more complete depression screening is indicated)   Over the past two weeks, have you felt down, depressed or hopeless? Yes because my arm hurts Over the past two weeks, have you felt little interest or pleasure in doing things? No Have you lost interest or pleasure in daily life? No Do you often feel hopeless? No Do you cry easily over simple problems? No   Activities of Daily Living  In your present state of health, do you have any difficulty performing the following activities?:   Driving? No  Managing money? No  Feeding yourself? No  Getting from bed to chair? No  Climbing a flight of stairs? No  Preparing food and eating?: No  Bathing or showering? No  Getting dressed: No  Getting to the toilet? No  Using the toilet:No  Moving around from place to place: No  In the past year have you fallen or had a near fall?:No  Are you sexually active? No  Do you have more than one partner? N/A  Hearing Difficulties: No  Do you often ask people to speak up or repeat themselves? No  Do you experience ringing or noises in your ears? No  Do you have difficulty understanding soft or whispered voices? No  Do you feel that you have a problem with memory? Sometimes do you often misplace items? Sometimes   Home Safety:  Do you have a smoke alarm at your residence? Yes Do you have grab bars in the bathroom? Yes Do you have throw rugs in your house? Yes   Cognitive Testing  Alert? Yes Normal Appearance?Yes  Oriented to person? Yes Place? Yes  Time? Yes  Recall of three  objects? Yes  Can perform simple calculations? Yes  Displays appropriate judgment?Yes  Can read the correct time from a watch face?Yes   List the Names of Other Physician/Practitioners you currently use:  See referral list for the physicians patient is currently seeing.     Review of Systems: See above   Objective:     General appearance: Appears stated age and mildly obese  Head: Normocephalic, without obvious abnormality, atraumatic  Eyes: conj clear, EOMi PEERLA  Ears: normal TM's and external ear canals both ears  Nose: Nares normal. Septum midline. Mucosa normal. No drainage or sinus tenderness.  Throat: lips, mucosa, and tongue normal; teeth and gums normal  Neck: no adenopathy, no carotid bruit, no JVD, supple, symmetrical, trachea midline and thyroid not enlarged, symmetric, no tenderness/mass/nodules  No CVA tenderness.  Lungs: clear to auscultation bilaterally  Breasts: normal appearance, no masses or tenderness Heart: regular rate and rhythm, S1, S2 normal, no murmur, click, rub or  gallop  Abdomen: soft, non-tender; bowel sounds normal; no masses, no organomegaly  Musculoskeletal: ROM normal in all joints, no crepitus, no deformity, Normal muscle strengthen. Back  is symmetric, no curvature. Skin: Skin color, texture, turgor normal. No rashes or lesions  Lymph nodes: Cervical, supraclavicular, and axillary nodes normal.  Neurologic: CN 2 -12 Normal, Normal symmetric reflexes. Normal coordination and gait  Psych: Alert & Oriented x 3, Mood appear stable.    Assessment:    Annual wellness medicare exam   Plan:    During the course of the visit the patient was educated and counseled about appropriate screening and preventive services including:   She has had 2 COVID-19 vaccines, tetanus immunization is up-to-date but will expire in 2022  Has had one Shingrix vaccine in January 2020 and can go ahead and get the other one  Pneumococcal vaccines up-to-date  Gets  annual flu vaccine    Patient Instructions (the written plan) was given to the patient.  Medicare Attestation  I have personally reviewed:  The patient's medical and social history  Their use of alcohol, tobacco or illicit drugs  Their current medications and supplements  The patient's functional ability including ADLs,fall risks, home safety risks, cognitive, and hearing and visual impairment  Diet and physical activities  Evidence for depression or mood disorders  The patient's weight, height, BMI, and visual acuity have been recorded in the chart. I have made referrals, counseling, and provided education to the patient based on review of the above and I have provided the patient with a written personalized care plan for preventive services.

## 2020-05-18 NOTE — Patient Instructions (Addendum)
Patient to contact Dr. Yisroel Ramming regarding right mid humerus pain. Continue diet and exercise effforts. RTC in 6 months. Refilled meloxicam. Continue current medications. It was a pleasure to see you today.

## 2020-05-20 ENCOUNTER — Other Ambulatory Visit: Payer: Self-pay

## 2020-05-20 ENCOUNTER — Encounter: Payer: Self-pay | Admitting: Physical Therapy

## 2020-05-20 ENCOUNTER — Ambulatory Visit: Payer: Medicare Other | Admitting: Physical Therapy

## 2020-05-20 DIAGNOSIS — M6281 Muscle weakness (generalized): Secondary | ICD-10-CM | POA: Diagnosis not present

## 2020-05-20 DIAGNOSIS — M25511 Pain in right shoulder: Secondary | ICD-10-CM | POA: Diagnosis not present

## 2020-05-20 DIAGNOSIS — R293 Abnormal posture: Secondary | ICD-10-CM | POA: Diagnosis not present

## 2020-05-20 DIAGNOSIS — M25611 Stiffness of right shoulder, not elsewhere classified: Secondary | ICD-10-CM

## 2020-05-20 DIAGNOSIS — R29898 Other symptoms and signs involving the musculoskeletal system: Secondary | ICD-10-CM

## 2020-05-20 NOTE — Therapy (Signed)
[facility] [place] [address]  [place], [place], [address] Phone: [phone]   Fax:  [phone]  Physical Therapy Treatment  Patient Details  Name: Diana George MRN: [id number] Date of Birth: 1946-01-17 Referring Provider (PT): Emeline General, MD   Encounter Date: 05/20/2020   PT End of Session - 05/20/20 1400    Visit Number 15    Number of Visits 20    Date for PT Re-Evaluation 06/04/20    Authorization Type BCBS Medicare    PT Start Time 1400    PT Stop Time 1441    PT Time Calculation (min) 41 min    Activity Tolerance Patient tolerated treatment well    Behavior During Therapy [facility] for tasks assessed/performed           Past Medical History:  Diagnosis Date  . Anxiety   . Depression   . Diabetes mellitus   . History of coronary rotational ablation 2006  . Hyperlipidemia   . Renal stone   . Sleep apnea    uses cpap    Past Surgical History:  Procedure Laterality Date  . CATARACT EXTRACTION W/ INTRAOCULAR LENS  IMPLANT, BILATERAL  2010  . heart ablation  2010  . LEG SURGERY Left 2011   fracture with hardware  . STONE EXTRACTION WITH BASKET  2016, 1977  . TUBAL LIGATION  1977    There were no vitals filed for this visit.   Subjective Assessment - 05/20/20 1404    Subjective Pt reports she saw her referring MD on Tuesday who would like her to see an orthopedist, but she is unsure if the pain is bad enough to warrant this. Overall pain has been much better (even woke up w/o pain today) but she still feels like she favors the R arm.    Diagnostic tests X-ray 02/05/20: No evidence of displaced rib fracture or pneumothorax. Right calcific supraspinatus tendinosis.    Patient Stated Goals "R arm to stop hurting and be stronger"    Currently in Pain? No/denies    Pain Onset More than a month ago                             [facility] Adult PT Treatment/Exercise - 05/20/20 1400       Exercises   Exercises Shoulder      Shoulder Exercises: Standing   Horizontal ABduction Both;10 reps;Strengthening;Theraband    Theraband Level (Shoulder Horizontal ABduction) Level 1 (Yellow)    External Rotation Right;10 reps;Strengthening;Theraband    Theraband Level (Shoulder External Rotation) Level 2 (Red)    External Rotation Limitations neutral shoulder with towel under elbow - cues for scap retraction &  keeping thumb pointing up    Internal Rotation Right;10 reps;Strengthening;Theraband    Theraband Level (Shoulder Internal Rotation) Level 2 (Red)    Internal Rotation Limitations neutral shoulder with towel under elbow - cues to avoid fwd rounding of shoulder    Extension Both;10 reps;Strengthening;Theraband    Theraband Level (Shoulder Extension) Level 2 (Red)    Extension Limitations VC for scap retraction & depression with 3-5" hold time    Row Both;10 reps;Strengthening;Theraband    Theraband Level (Shoulder Row) Level 2 (Red)    Row Limitations cues to avoid R shoulder shrug    Other Standing Exercises B yellow TB shoulder ER + scap retraction 10 x 3" - cues to keep thumbs up  Shoulder Exercises: ROM/Strengthening   UBE (Upper Arm Bike) L2.5 x 6 min (3' fwd/3' back)      Shoulder Exercises: Stretch   Internal Rotation Stretch Limitations 5" x 10 reps - towel stretch                    PT Short Term Goals - 03/22/20 1448      PT SHORT TERM GOAL #1   Title Patient will be independent with initial HEP    Status Achieved   03/22/20            PT Long Term Goals - 05/17/20 1508      PT LONG TERM GOAL #1   Title Patient will be independent with ongoing/advanced HEP    Status Partially Met      PT LONG TERM GOAL #2   Title Patient to demonstrate ability to achieve and maintain good R shoulder alignment/posturing to improve GHJ mechancis    Status Partially Met   07/19: some excessive R scapular elevation still visible at end ROM flexion     PT  LONG TERM GOAL #3   Title Patient to improve R shoulder AROM to [facility] without pain provocation    Status Partially Met   07/19: limited in behind back FIR reaching to bra strap     PT LONG TERM GOAL #4   Title Patient will demonstrate improved R shoulder strength to >/= 4/5 for functional UE use    Status Achieved   04/22/20     PT LONG TERM GOAL #5   Title Patient to report ability to perform ADLs and daily household chores and activities without R shoulder pain provocation    Status Partially Met   07/19; pt. only limited now with washing windows                Plan - 05/20/20 1451    Clinical Impression Statement Diana George reports f/u with MD went well but MD wanting her to see an orthopedist - she is no sure this is necessary as her pain and function seems so much better. Pt nearing end of current POC therefore initiated HEP review and progressed resistance with rows/extension and IR/ER to red TB with good tolerance but keep B ER and horiz ABD + retraction with yellow TB in standing. Cues necessary with some exercise to correct alignment or positioning but no pain reports and pt continues to note good benefit from theraband exercises. With pt reluctant to see orthopedist, encouraged her to continue with HEP and if things do not continue to improve, then consider going forward with orthopedist consult.    Comorbidities DM, OSA, anxiety and depression, h/o R meniscal tear, L wrist fx, L tibia/ankle fx    Rehab Potential Good    PT Frequency 2x / week    PT Duration 6 weeks    PT Treatment/Interventions ADLs/Self Care Home Management;Cryotherapy;Electrical Stimulation;Iontophoresis 39m/ml Dexamethasone;Moist Heat;Ultrasound;Functional mobility training;Therapeutic activities;Therapeutic exercise;Neuromuscular re-education;Patient/family education;Manual techniques;Passive range of motion;Dry needling;Taping;Vasopneumatic Device;Joint Manipulations    PT Next Visit Plan Final HEP review  addressing any pt concern from update 7/22; pain free R shoulder AROM and strengthening; manual therapy to address increased muscle tension/ttp; modalities PRN for pain    PT Home Exercise Plan 5/13 - UT, LS and low doorway pec stretches, scap retraction; 5/20 - low/mid single arm doorway pec stretch; 5/24 - yellow TB rows/retraction, shoulder isometrics; 6/21 - yellow TB scap retraction + hooklying horiz ABD & seated ER; 7/12 -  yellow TB shoulder IR/ER, wall pushups (discontinued isometrics); 7/19 - AAROM wand (or towel) IR stretch behind back    Consulted and Agree with Plan of Care Patient           Patient will benefit from skilled therapeutic intervention in order to improve the following deficits and impairments:  Decreased activity tolerance, Decreased mobility, Decreased range of motion, Decreased strength, Increased fascial restricitons, Increased muscle spasms, Impaired perceived functional ability, Impaired flexibility, Impaired UE functional use, Postural dysfunction, Improper body mechanics, Pain  Visit Diagnosis: Acute pain of right shoulder  Stiffness of right shoulder, not elsewhere classified  Muscle weakness (generalized)  Abnormal posture  Other symptoms and signs involving the musculoskeletal system     Problem List Patient Active Problem List   Diagnosis Date Noted  . OSA (obstructive sleep apnea) 04/13/2014  . Allergic rhinitis 09/29/2012  . Situational stress 09/29/2012  . Hyperlipidemia 06/16/2011  . Diabetes mellitus ([place]) 06/16/2011  . Anxiety and depression 06/16/2011    Percival Spanish, PT, MPT 05/20/2020, 2:54 PM  [facility] [address]  [place], [place], [address] Phone: [phone]   Fax:  [phone]  Name: Diana George MRN: [id number] Date of Birth: [birth year]

## 2020-05-24 ENCOUNTER — Ambulatory Visit: Payer: Medicare Other

## 2020-05-27 ENCOUNTER — Other Ambulatory Visit: Payer: Self-pay

## 2020-05-27 ENCOUNTER — Encounter: Payer: Self-pay | Admitting: Physical Therapy

## 2020-05-27 ENCOUNTER — Ambulatory Visit: Payer: Medicare Other | Admitting: Physical Therapy

## 2020-05-27 DIAGNOSIS — M25611 Stiffness of right shoulder, not elsewhere classified: Secondary | ICD-10-CM

## 2020-05-27 DIAGNOSIS — M25511 Pain in right shoulder: Secondary | ICD-10-CM | POA: Diagnosis not present

## 2020-05-27 DIAGNOSIS — R293 Abnormal posture: Secondary | ICD-10-CM

## 2020-05-27 DIAGNOSIS — R29898 Other symptoms and signs involving the musculoskeletal system: Secondary | ICD-10-CM | POA: Diagnosis not present

## 2020-05-27 DIAGNOSIS — M6281 Muscle weakness (generalized): Secondary | ICD-10-CM

## 2020-05-27 NOTE — Patient Instructions (Signed)
    Home exercise program created by Geselle Cardosa, PT.  For questions, please contact Avalie Oconnor via phone at [phone] or email at [email or web address]  [facility] [place] [facility] [address] [place] [address]  [address] [place], [place], [address] Phone: [phone]   Fax:  [phone]    

## 2020-05-27 NOTE — Therapy (Addendum)
[facility] [place] [address]  [place], [place], [address] Phone: [phone]   Fax:  [phone]  Physical Therapy Treatment  Patient Details  Name: Diana George MRN: [id number] Date of Birth: May 05, 1946 Referring Provider (PT): Emeline General, MD   Encounter Date: 05/27/2020   PT End of Session - 05/27/20 1357    Visit Number 16    Number of Visits 20    Date for PT Re-Evaluation 06/04/20    Authorization Type BCBS Medicare    PT Start Time 5277    PT Stop Time 1445    PT Time Calculation (min) 48 min    Activity Tolerance Patient tolerated treatment well    Behavior During Therapy [facility] for tasks assessed/performed           Past Medical History:  Diagnosis Date  . Anxiety   . Depression   . Diabetes mellitus   . History of coronary rotational ablation 2006  . Hyperlipidemia   . Renal stone   . Sleep apnea    uses cpap    Past Surgical History:  Procedure Laterality Date  . CATARACT EXTRACTION W/ INTRAOCULAR LENS  IMPLANT, BILATERAL  2010  . heart ablation  2010  . LEG SURGERY Left 2011   fracture with hardware  . STONE EXTRACTION WITH BASKET  2016, 1977  . TUBAL LIGATION  1977    There were no vitals filed for this visit.   Subjective Assessment - 05/27/20 1359    Subjective Pt reports her shoulder has been good - still notes some pain when waking up most mornings, but pain goes away as she gets moving.    Diagnostic tests X-ray 02/05/20: No evidence of displaced rib fracture or pneumothorax. Right calcific supraspinatus tendinosis.    Patient Stated Goals "R arm to stop hurting and be stronger"    Currently in Pain? No/denies    Pain Onset More than a month ago                             [facility] Adult PT Treatment/Exercise - 05/27/20 1357      Exercises   Exercises Shoulder      Shoulder Exercises: Prone   Extension Both;10 reps;AROM;Strengthening    Extension  Limitations I's leaning over green Pball on mat table    External Rotation Both;10 reps;AROM;Strengthening    External Rotation Limitations W's leaning over green Pball on mat table    Horizontal ABduction 1 Both;10 reps;AROM;Strengthening    Horizontal ABduction 1 Limitations T's leaning over orange Pball on mat table    Horizontal ABduction 2 Both;10 reps;AROM;Strengthening    Horizontal ABduction 2 Limitations Y's leaning over green Pball on mat table      Shoulder Exercises: Standing   Horizontal ABduction Both;12 reps;Strengthening;Theraband    Theraband Level (Shoulder Horizontal ABduction) Level 1 (Yellow)    Horizontal ABduction Limitations standing with back along doorframe for tactile feedback with scapular retraction    External Rotation Right;10 reps;Strengthening;Theraband    Theraband Level (Shoulder External Rotation) Level 2 (Red)    Internal Rotation Right;12 reps;Strengthening;Theraband    Theraband Level (Shoulder Internal Rotation) Level 2 (Red)    Internal Rotation Limitations neutral shoulder with towel under elbow - cues to avoid fwd rounding of shoulder    Extension Both;10 reps;Strengthening;Theraband    Theraband Level (Shoulder Extension) Level 2 (Red)    Extension Limitations improved scapular  activation    Row Both;10 reps;Strengthening;Theraband    Theraband Level (Shoulder Row) Level 2 (Red)    Row Limitations cues for desired ROM avoiding excessive shoulder extension or upward rotation of arms/elbows    Other Standing Exercises B yellow TB shoulder ER + scap retraction 10 x 3" - cues to keep thumbs up; standing with back along doorframe for tactile feedback with scapular retraction      Shoulder Exercises: Therapy Ball   Flexion Both;10 reps    Flexion Limitations green Pball on wall + R hand lift-off      Shoulder Exercises: ROM/Strengthening   UBE (Upper Arm Bike) L2.5 x 6 min (3' fwd/3' back)    Wall Pushups 10 reps    Wall Pushups Limitations on  green Pball                  PT Education - 05/27/20 1445    Education Details HEP update - Pball exercises    Person(s) Educated Patient    Methods Explanation;Demonstration;Handout    Comprehension Verbalized understanding;Returned demonstration            PT Short Term Goals - 03/22/20 1448      PT SHORT TERM GOAL #1   Title Patient will be independent with initial HEP    Status Achieved   03/22/20            PT Long Term Goals - 05/17/20 1508      PT LONG TERM GOAL #1   Title Patient will be independent with ongoing/advanced HEP    Status Partially Met      PT LONG TERM GOAL #2   Title Patient to demonstrate ability to achieve and maintain good R shoulder alignment/posturing to improve GHJ mechancis    Status Partially Met   07/19: some excessive R scapular elevation still visible at end ROM flexion     PT LONG TERM GOAL #3   Title Patient to improve R shoulder AROM to [facility] without pain provocation    Status Partially Met   07/19: limited in behind back FIR reaching to bra strap     PT LONG TERM GOAL #4   Title Patient will demonstrate improved R shoulder strength to >/= 4/5 for functional UE use    Status Achieved   04/22/20     PT LONG TERM GOAL #5   Title Patient to report ability to perform ADLs and daily household chores and activities without R shoulder pain provocation    Status Partially Met   07/19; pt. only limited now with washing windows                Plan - 05/27/20 1404    Clinical Impression Statement Sary reports no issues with HEP TB progression to red TB other than she felt red TB was "too much" with B ER and abduction - reminded pt that these 2 exercises were still intended to be performed with yellow TB as of last review. Pt reports she has a Physioball at home that she would like to be able to incorporate into her HEP, therefore provided instruction Pball exercises for HEP. Pt will return for final visit next week for formal  goal assessment, at which time we anticipate transition to HEP with probable 30-day hold.    Comorbidities DM, OSA, anxiety and depression, h/o R meniscal tear, L wrist fx, L tibia/ankle fx    Rehab Potential Good    PT Frequency 2x / week  PT Duration 6 weeks    PT Treatment/Interventions ADLs/Self Care Home Management;Cryotherapy;Electrical Stimulation;Iontophoresis 76m/ml Dexamethasone;Moist Heat;Ultrasound;Functional mobility training;Therapeutic activities;Therapeutic exercise;Neuromuscular re-education;Patient/family education;Manual techniques;Passive range of motion;Dry needling;Taping;Vasopneumatic Device;Joint Manipulations    PT Next Visit Plan Discharge assessment with anticipated transition to HEP +/- 30-day hold    PT Home Exercise Plan 5/13 - UT, LS and low doorway pec stretches, scap retraction; 5/20 - low/mid single arm doorway pec stretch; 5/24 - yellow TB rows/retraction, shoulder isometrics; 6/21 - yellow TB scap retraction + hooklying horiz ABD & seated ER; 7/12 - yellow TB shoulder IR/ER, wall pushups (discontinued isometrics); 7/19 - AAROM wand (or towel) IR stretch behind back; 7/29 - prone Pball I's, T's, Y's & W's, Pball wall walk + lift off and wall pushup    Consulted and Agree with Plan of Care Patient           Patient will benefit from skilled therapeutic intervention in order to improve the following deficits and impairments:  Decreased activity tolerance, Decreased mobility, Decreased range of motion, Decreased strength, Increased fascial restricitons, Increased muscle spasms, Impaired perceived functional ability, Impaired flexibility, Impaired UE functional use, Postural dysfunction, Improper body mechanics, Pain  Visit Diagnosis: Acute pain of right shoulder  Stiffness of right shoulder, not elsewhere classified  Muscle weakness (generalized)  Abnormal posture  Other symptoms and signs involving the musculoskeletal system     Problem List Patient  Active Problem List   Diagnosis Date Noted  . OSA (obstructive sleep apnea) 04/13/2014  . Allergic rhinitis 09/29/2012  . Situational stress 09/29/2012  . Hyperlipidemia 06/16/2011  . Diabetes mellitus ([place] 06/16/2011  . Anxiety and depression 06/16/2011    JPercival Spanish PT, MPT 05/27/2020, 5:42 PM  [facility] [place] [address]Phone: [phone]  Fax:  [phone] Name: SSHARANYA TEMPLINMRN: [id number] of Birth: 111-27-1947

## 2020-05-31 ENCOUNTER — Encounter: Payer: Self-pay | Admitting: Physical Therapy

## 2020-05-31 ENCOUNTER — Ambulatory Visit: Payer: Medicare Other | Attending: Internal Medicine | Admitting: Physical Therapy

## 2020-05-31 ENCOUNTER — Other Ambulatory Visit: Payer: Self-pay

## 2020-05-31 DIAGNOSIS — M25611 Stiffness of right shoulder, not elsewhere classified: Secondary | ICD-10-CM | POA: Insufficient documentation

## 2020-05-31 DIAGNOSIS — R293 Abnormal posture: Secondary | ICD-10-CM | POA: Diagnosis not present

## 2020-05-31 DIAGNOSIS — R29898 Other symptoms and signs involving the musculoskeletal system: Secondary | ICD-10-CM | POA: Insufficient documentation

## 2020-05-31 DIAGNOSIS — M6281 Muscle weakness (generalized): Secondary | ICD-10-CM | POA: Diagnosis not present

## 2020-05-31 DIAGNOSIS — M25511 Pain in right shoulder: Secondary | ICD-10-CM

## 2020-05-31 NOTE — Therapy (Addendum)
[facility] [address]  [place], [place], [address] Phone: [phone]   Fax:  [phone]  Physical Therapy Treatment / Discharge Summary  Patient Details  Name: Diana George MRN: [id number] Date of Birth: 26-Aug-1946 Referring Provider (PT): Emeline General, MD   Encounter Date: 05/31/2020   PT End of Session - 05/31/20 1530    Visit Number 17    Number of Visits 20    Date for PT Re-Evaluation 06/04/20    Authorization Type BCBS Medicare    PT Start Time 1530    PT Stop Time 1610    PT Time Calculation (min) 40 min    Activity Tolerance Patient tolerated treatment well    Behavior During Therapy [facility] for tasks assessed/performed           Past Medical History:  Diagnosis Date  . Anxiety   . Depression   . Diabetes mellitus   . History of coronary rotational ablation 2006  . Hyperlipidemia   . Renal stone   . Sleep apnea    uses cpap    Past Surgical History:  Procedure Laterality Date  . CATARACT EXTRACTION W/ INTRAOCULAR LENS  IMPLANT, BILATERAL  2010  . heart ablation  2010  . LEG SURGERY Left 2011   fracture with hardware  . STONE EXTRACTION WITH BASKET  2016, 1977  . TUBAL LIGATION  1977    There were no vitals filed for this visit.   Subjective Assessment - 05/31/20 1535    Subjective Pt preparing to leave for vacation at the beach for a week. Feels ready to try on her own with HEP but would like to remain on 30-day hold. Denies pain currrenlty but states her shoulder just didn't feel good yesterday - not sure why and not bad enough to take pain meds.    Diagnostic tests X-ray 02/05/20: No evidence of displaced rib fracture or pneumothorax. Right calcific supraspinatus tendinosis.    Patient Stated Goals "R arm to stop hurting and be stronger"    Currently in Pain? No/denies    Pain Onset More than a month ago              [facility] PT Assessment - 05/31/20 1530      Assessment    Medical Diagnosis R shoulder pain    Referring Provider (PT) Emeline General, MD    Onset Date/Surgical Date 01/24/20    Hand Dominance Right    Next MD Visit PRN      Observation/Other Assessments   Focus on Therapeutic Outcomes (FOTO)  Shoulder - 74% (26% limitation)      AROM   Right Shoulder Flexion 157 Degrees    Right Shoulder ABduction 151 Degrees    Right Shoulder Internal Rotation 81 Degrees   FIR to T10    Right Shoulder External Rotation 77 Degrees   FER to T3      Strength   Right Shoulder Flexion 5/5    Right Shoulder ABduction 4+/5    Right Shoulder Internal Rotation 4+/5    Right Shoulder External Rotation 4+/5    Left Shoulder Flexion 5/5    Left Shoulder ABduction 5/5    Left Shoulder Internal Rotation 5/5    Left Shoulder External Rotation 5/5                         [facility] Adult PT Treatment/Exercise - 05/31/20 1530  Self-Care   Self-Care Posture    Posture Reviewed sleeping position to promote better shoulder alignment.    Other Self-Care Comments  Discussed proper technique with vacuuming, walking with vacuum rather than reaching to push/pull vacuum to minimize shoulder strain.      Exercises   Exercises Shoulder      Shoulder Exercises: ROM/Strengthening   UBE (Upper Arm Bike) L2.5 x 6 min (3' fwd/3' back)                    PT Short Term Goals - 03/22/20 1448      PT SHORT TERM GOAL #1   Title Patient will be independent with initial HEP    Status Achieved   03/22/20            PT Long Term Goals - 05/31/20 1538      PT LONG TERM GOAL #1   Title Patient will be independent with ongoing/advanced HEP    Status Achieved   05/31/20     PT LONG TERM GOAL #2   Title Patient to demonstrate ability to achieve and maintain good R shoulder alignment/posturing to improve GHJ mechanics    Status Achieved   07/19: some excessive R scapular elevation still visible at end ROM flexion     PT LONG TERM GOAL #3   Title  Patient to improve R shoulder AROM to [facility] without pain provocation    Status Achieved   05/31/20     PT LONG TERM GOAL #4   Title Patient will demonstrate improved R shoulder strength to >/= 4/5 for functional UE use    Status Achieved   04/22/20     PT LONG TERM GOAL #5   Title Patient to report ability to perform ADLs and daily household chores and activities without R shoulder pain provocation    Status Partially Met   05/31/20 - only notes difficulty with taking something off high shelf & vacuuming                Plan - 05/31/20 1535    Clinical Impression Statement Diana George notes significant reduction in pain and improvement in functional use of R UE since start of PT. R shoulder ROM now [facility] and nearly symmetrical to L with only mild limitation in FIR still noted. R shoulder strength now 4+/5 to 5/5. She reports improved postural awareness and only notes limitation with lifting something off a high shelf along with increased discomfort when vacuuming - discussed using body to walk vacuum fwd/back rather than pushing/pulling vacuum by reaching fwd and pulling back to minimize shoulder strain. All goals met except LTG#5 only partially met due to above functional limitations. Diana George feels comfortable with her ongoing HEP and is ready to try transitioning to her HEP on her own at this time, but would like to remain on hold for 30 days in the event that further issues arise necessitating return to PT.    Comorbidities DM, OSA, anxiety and depression, h/o R meniscal tear, L wrist fx, L tibia/ankle fx    Rehab Potential Good    PT Frequency --    PT Duration --    PT Treatment/Interventions ADLs/Self Care Home Management;Cryotherapy;Electrical Stimulation;Iontophoresis '4mg'$ /ml Dexamethasone;Moist Heat;Ultrasound;Functional mobility training;Therapeutic activities;Therapeutic exercise;Neuromuscular re-education;Patient/family education;Manual techniques;Passive range of motion;Dry  needling;Taping;Vasopneumatic Device;Joint Manipulations    PT Next Visit Plan transition to HEP + 30-day hold    PT Home Exercise Plan 5/13 - UT, LS and low doorway pec stretches, scap retraction;  5/20 - low/mid single arm doorway pec stretch; 5/24 - yellow TB rows/retraction, shoulder isometrics; 6/21 - yellow TB scap retraction + hooklying horiz ABD & seated ER; 7/12 - yellow TB shoulder IR/ER, wall pushups (discontinued isometrics); 7/19 - AAROM wand (or towel) IR stretch behind back; 7/29 - prone Pball I's, T's, Y's & W's, Pball wall walk + lift off and wall pushup    Consulted and Agree with Plan of Care Patient           Patient will benefit from skilled therapeutic intervention in order to improve the following deficits and impairments:  Decreased activity tolerance, Decreased mobility, Decreased range of motion, Decreased strength, Increased fascial restricitons, Increased muscle spasms, Impaired perceived functional ability, Impaired flexibility, Impaired UE functional use, Postural dysfunction, Improper body mechanics, Pain  Visit Diagnosis: Acute pain of right shoulder  Stiffness of right shoulder, not elsewhere classified  Muscle weakness (generalized)  Abnormal posture  Other symptoms and signs involving the musculoskeletal system     Problem List Patient Active Problem List   Diagnosis Date Noted  . OSA (obstructive sleep apnea) 04/13/2014  . Allergic rhinitis 09/29/2012  . Situational stress 09/29/2012  . Hyperlipidemia 06/16/2011  . Diabetes mellitus ([place]) 06/16/2011  . Anxiety and depression 06/16/2011    Percival Spanish, PT, MPT 05/31/2020, 7:37 PM  [facility] [address]  [place], [place], [address] Phone: [phone]   Fax:  [phone]  Name: Diana George MRN: [id number] Date of Birth: [birth year]  PHYSICAL THERAPY DISCHARGE SUMMARY  Visits from Start of Care: 17  Current  functional level related to goals / functional outcomes:   Refer to above clinical impression for status as of last visit on 05/31/2020. Patient was placed on hold for 30 days and has not needed to return to PT, therefore will proceed with discharge from PT for this episode.   Remaining deficits:   As above.   Education / Equipment:   HEP  Plan: Patient agrees to discharge.  Patient goals were mostly met. Patient is being discharged due to being pleased with the current functional level.  ?????     Percival Spanish, PT, MPT 07/12/20, 9:44 AM  [facility] [place], [place], [address] Phone: [phone]   Fax:  [phone]

## 2020-06-03 ENCOUNTER — Encounter: Payer: Medicare Other | Admitting: Physical Therapy

## 2020-06-10 ENCOUNTER — Encounter: Payer: Medicare Other | Admitting: Physical Therapy

## 2020-06-17 ENCOUNTER — Encounter: Payer: Medicare Other | Admitting: Physical Therapy

## 2020-06-21 DIAGNOSIS — Z20828 Contact with and (suspected) exposure to other viral communicable diseases: Secondary | ICD-10-CM | POA: Diagnosis not present

## 2020-06-24 ENCOUNTER — Encounter: Payer: Medicare Other | Admitting: Physical Therapy

## 2020-06-28 DIAGNOSIS — M8588 Other specified disorders of bone density and structure, other site: Secondary | ICD-10-CM | POA: Diagnosis not present

## 2020-06-28 DIAGNOSIS — N958 Other specified menopausal and perimenopausal disorders: Secondary | ICD-10-CM | POA: Diagnosis not present

## 2020-07-01 ENCOUNTER — Encounter: Payer: Medicare Other | Admitting: Physical Therapy

## 2020-07-08 ENCOUNTER — Encounter: Payer: Medicare Other | Admitting: Physical Therapy

## 2020-07-19 ENCOUNTER — Encounter: Payer: Self-pay | Admitting: Internal Medicine

## 2020-07-19 DIAGNOSIS — H52223 Regular astigmatism, bilateral: Secondary | ICD-10-CM | POA: Diagnosis not present

## 2020-07-19 LAB — HM DIABETES EYE EXAM

## 2020-07-28 DIAGNOSIS — M8580 Other specified disorders of bone density and structure, unspecified site: Secondary | ICD-10-CM | POA: Diagnosis not present

## 2020-08-04 ENCOUNTER — Other Ambulatory Visit: Payer: Self-pay

## 2020-08-04 ENCOUNTER — Encounter: Payer: Self-pay | Admitting: Internal Medicine

## 2020-08-04 ENCOUNTER — Ambulatory Visit (INDEPENDENT_AMBULATORY_CARE_PROVIDER_SITE_OTHER): Payer: Medicare Other | Admitting: Internal Medicine

## 2020-08-04 VITALS — BP 110/80 | HR 70 | Temp 97.8°F | Ht 65.0 in | Wt 191.0 lb

## 2020-08-04 DIAGNOSIS — Z23 Encounter for immunization: Secondary | ICD-10-CM

## 2020-08-04 NOTE — Patient Instructions (Signed)
Patient received a flu vaccine IM L deltoid, AV, CMA  

## 2020-08-04 NOTE — Progress Notes (Signed)
Flu vaccine given by CMA 

## 2020-10-08 ENCOUNTER — Emergency Department (HOSPITAL_BASED_OUTPATIENT_CLINIC_OR_DEPARTMENT_OTHER)
Admission: EM | Admit: 2020-10-08 | Discharge: 2020-10-08 | Disposition: A | Discharge status: Home / Self Care | Payer: Medicare Other | Attending: Emergency Medicine | Admitting: Emergency Medicine

## 2020-10-08 ENCOUNTER — Emergency Department (HOSPITAL_BASED_OUTPATIENT_CLINIC_OR_DEPARTMENT_OTHER): Payer: Medicare Other

## 2020-10-08 ENCOUNTER — Encounter (HOSPITAL_BASED_OUTPATIENT_CLINIC_OR_DEPARTMENT_OTHER): Payer: Self-pay | Admitting: Emergency Medicine

## 2020-10-08 ENCOUNTER — Other Ambulatory Visit: Payer: Self-pay

## 2020-10-08 DIAGNOSIS — Y9301 Activity, walking, marching and hiking: Secondary | ICD-10-CM | POA: Diagnosis not present

## 2020-10-08 DIAGNOSIS — Y9222 Religious institution as the place of occurrence of the external cause: Secondary | ICD-10-CM | POA: Diagnosis not present

## 2020-10-08 DIAGNOSIS — R079 Chest pain, unspecified: Secondary | ICD-10-CM | POA: Insufficient documentation

## 2020-10-08 DIAGNOSIS — Z7984 Long term (current) use of oral hypoglycemic drugs: Secondary | ICD-10-CM | POA: Insufficient documentation

## 2020-10-08 DIAGNOSIS — M7918 Myalgia, other site: Secondary | ICD-10-CM

## 2020-10-08 DIAGNOSIS — M79642 Pain in left hand: Secondary | ICD-10-CM | POA: Diagnosis not present

## 2020-10-08 DIAGNOSIS — R0789 Other chest pain: Secondary | ICD-10-CM | POA: Diagnosis not present

## 2020-10-08 DIAGNOSIS — M791 Myalgia, unspecified site: Secondary | ICD-10-CM | POA: Insufficient documentation

## 2020-10-08 DIAGNOSIS — E119 Type 2 diabetes mellitus without complications: Secondary | ICD-10-CM | POA: Diagnosis not present

## 2020-10-08 DIAGNOSIS — W1830XA Fall on same level, unspecified, initial encounter: Secondary | ICD-10-CM | POA: Insufficient documentation

## 2020-10-08 DIAGNOSIS — W19XXXA Unspecified fall, initial encounter: Secondary | ICD-10-CM

## 2020-10-08 MED ORDER — ACETAMINOPHEN 325 MG PO TABS
650.0000 mg | ORAL_TABLET | Freq: Once | ORAL | Status: AC
  Administered 2020-10-08: 650 mg via ORAL
  Filled 2020-10-08: qty 2

## 2020-10-08 NOTE — ED Triage Notes (Signed)
Patient reports she fell going around her car at church yesterday.  Having pain in left wrist, left shoulder and left ribcage.  Ambulatory at triage.

## 2020-10-08 NOTE — ED Provider Notes (Signed)
[facility] EMERGENCY DEPARTMENT Provider Note   CSN: [id number] Arrival date & time: 10/08/20  1749     History Chief Complaint  Patient presents with  . Fall    Diana George is a 74 y.o. female.  Presents of a fall midday yesterday complaining of left-sided chest and left hand pain.  States she was walking around her car when she lost her balance and fell denies loss of consciousness denies headache or neck pain or back pain.  States the pain was not so bad yesterday when she woke up today the pain felt worse so she presents to the ER.        Past Medical History:  Diagnosis Date  . Anxiety   . Depression   . Diabetes mellitus   . History of coronary rotational ablation 2006  . Hyperlipidemia   . Renal stone   . Sleep apnea    uses cpap    Patient Active Problem List   Diagnosis Date Noted  . OSA (obstructive sleep apnea) 04/13/2014  . Allergic rhinitis 09/29/2012  . Situational stress 09/29/2012  . Hyperlipidemia 06/16/2011  . Diabetes mellitus ([facility]) 06/16/2011  . Anxiety and depression 06/16/2011    Past Surgical History:  Procedure Laterality Date  . CATARACT EXTRACTION W/ INTRAOCULAR LENS  IMPLANT, BILATERAL  2010  . heart ablation  2010  . LEG SURGERY Left 2011   fracture with hardware  . STONE EXTRACTION WITH BASKET  2016, 1977  . TUBAL LIGATION  1977     OB History   No obstetric history on file.     Family History  Problem Relation Age of Onset  . Alzheimer's disease Mother   . Alzheimer's disease Father   . Heart disease Father   . Colon cancer Father 61    Social History   Tobacco Use  . Smoking status: Never Smoker  . Smokeless tobacco: Never Used  Substance Use Topics  . Alcohol use: No    Alcohol/week: 0.0 standard drinks  . Drug use: No    Home Medications Prior to Admission medications   Medication Sig Start Date End Date Taking? Authorizing Provider  buPROPion (WELLBUTRIN XL) 150 MG 24 hr tablet Take 1  tablet (150 mg total) by mouth daily. 02/03/20   Margaree Mackintosh, MD  Cholecalciferol (VITAMIN D) 2000 UNITS CAPS Take 1 capsule by mouth 2 (two) times daily.     [provider]  clorazepate (TRANXENE) 3.75 MG tablet TAKE 1 TABLET BY MOUTH TWICE DAILY AS NEEDED FOR ANXIETY. GENERIC EQUIVALENT FOR TRANXENE. 02/03/20   Margaree Mackintosh, MD  Hyoscyamine Sulfate SL (LEVSIN/SL) 0.125 MG SUBL Place 0.125 tablets under the tongue 3 (three) times daily before meals. Patient taking differently: Place 0.125 tablets under the tongue as needed.  04/11/18   Margaree Mackintosh, MD  meloxicam (MOBIC) 15 MG tablet Take 1 tablet (15 mg total) by mouth daily. 05/18/20   Margaree Mackintosh, MD  metFORMIN (GLUCOPHAGE) 500 MG tablet Take 1 tablet (500 mg total) by mouth 2 (two) times daily. 02/03/20   Margaree Mackintosh, MD  sertraline (ZOLOFT) 100 MG tablet TAKE 1 TABLET BY MOUTH DAILY GENERIC EQUIVALENT FOR ZOLOFT 06/30/19   Margaree Mackintosh, MD  simvastatin (ZOCOR) 20 MG tablet Take 1 tablet (20 mg total) by mouth daily. 03/31/20   Margaree Mackintosh, MD  sitaGLIPtin (JANUVIA) 100 MG tablet Take 1 tablet (100 mg total) by mouth daily. 02/03/20   Margaree Mackintosh,  MD    Allergies    Amoxicillin  Review of Systems   Review of Systems  Constitutional: Negative for fever.  HENT: Negative for ear pain.   Eyes: Negative for pain.  Respiratory: Negative for cough.   Gastrointestinal: Negative for abdominal pain.  Genitourinary: Negative for flank pain.  Musculoskeletal: Negative for back pain.  Skin: Negative for rash.  Neurological: Negative for headaches.    Physical Exam Updated Vital Signs BP 132/78 (BP Location: Right Arm)   Pulse 77   Temp 98 F (36.7 C) (Oral)   Resp 18   Ht 5' 6.5" (1.689 m)   Wt 89.6 kg   SpO2 98%   BMI 31.42 kg/m   Physical Exam Constitutional:      General: She is not in acute distress.    Appearance: Normal appearance.  HENT:     Head: Normocephalic.     Nose: Nose normal.  Eyes:      Extraocular Movements: Extraocular movements intact.  Cardiovascular:     Rate and Rhythm: Normal rate.  Pulmonary:     Effort: Pulmonary effort is normal.  Abdominal:     Palpations: Abdomen is soft.     Tenderness: There is no abdominal tenderness.  Musculoskeletal:        General: Normal range of motion.     Cervical back: Normal range of motion.     Comments: Mild swelling to the left MCP joints.  Tenderness palpation the left metacarpal bones.  No wrist tenderness.  No step-off tenderness. Neurovascularly intact and compartments are soft.  No C/T/L-spine tenderness or step-offs noted.  Mild left mid chest wall tenderness palpation no crepitus noted.    Skin:    General: Skin is warm.  Neurological:     General: No focal deficit present.     Mental Status: She is alert.     ED Results / Procedures / Treatments   Labs (all labs ordered are listed, but only abnormal results are displayed) Labs Reviewed - No data to display  EKG None  Radiology DG Ribs Unilateral W/Chest Left  Result Date: 10/08/2020 CLINICAL DATA:  Larey Seat yesterday. Left wrist, left shoulder and left-sided chest pain. EXAM: LEFT RIBS AND CHEST - 3+ VIEW COMPARISON:  03/04/2020 FINDINGS: No fracture or bone lesion. Cardiac silhouette is normal in size. Small hiatal hernia. No mediastinal or hilar masses. Clear lungs.  No pleural effusion or pneumothorax. IMPRESSION: 1. No rib fracture or rib lesion. 2. No active cardiopulmonary disease. Electronically Signed   By: Amie Portland M.D.   On: 10/08/2020 10:19   DG Hand Complete Left  Result Date: 10/08/2020 CLINICAL DATA:  Larey Seat yesterday. Left wrist, left shoulder and left-sided chest pain. EXAM: LEFT HAND - COMPLETE 3+ VIEW COMPARISON:  None. FINDINGS: No fracture or bone lesion. Joints are normally spaced and aligned. Soft tissues are unremarkable. Skeletal structures are demineralized. IMPRESSION: No fracture or dislocation. Electronically Signed   By: Amie Portland M.D.   On: 10/08/2020 10:18    Procedures Procedures (including critical care time)  Medications Ordered in ED Medications  acetaminophen (TYLENOL) tablet 650 mg (650 mg Oral Given 10/08/20 0936)    ED Course  I have reviewed the triage vital signs and the nursing notes.  Pertinent labs & imaging results that were available during my care of the patient were reviewed by me and considered in my medical decision making (see chart for details).    MDM Rules/Calculators/A&P  No evidence of acute fracture on x-ray imaging.  Patient encouraged to take Tylenol and Motrin as needed around-the-clock for her pain.  Advise follow-up with primary care doctor in 2 to 3 days for repeat evaluation as needed.  Advised immediate return for new numbness weakness worsening pain or any additional concerns.   Final Clinical Impression(s) / ED Diagnoses Final diagnoses:  Musculoskeletal pain    Rx / DC Orders ED Discharge Orders    None       Cheryll Cockayne, MD 10/08/20 1057

## 2020-10-08 NOTE — Discharge Instructions (Addendum)
Call your primary care doctor or specialist as discussed in the next 2-3 days.   Return immediately back to the ER if:  Your symptoms worsen within the next 12-24 hours. You develop new symptoms such as new fevers, persistent vomiting, new pain, shortness of breath, or new weakness or numbness, or if you have any other concerns.  

## 2020-10-11 ENCOUNTER — Telehealth: Payer: Self-pay | Admitting: Internal Medicine

## 2020-10-11 ENCOUNTER — Other Ambulatory Visit: Payer: Self-pay | Admitting: Internal Medicine

## 2020-10-11 MED ORDER — SIMVASTATIN 20 MG PO TABS: 20.0000 mg | ORAL_TABLET | Freq: Every day | ORAL | 1 refills | Status: DC

## 2020-10-11 NOTE — Telephone Encounter (Signed)
Received Fax RX request from  Pharmacy -  [facility]) [facility] - [place], [place] - [address] Phone:  [phone]  Fax:  [phone]       Medication - simvastatin (ZOCOR) 20 MG tablet   Last Refill -   Last OV - 05/18/2020  Last CPE - 05/18/20  Next Appointment - 11/17/2019

## 2020-10-11 NOTE — Telephone Encounter (Signed)
Refill Zocor as requested

## 2020-10-12 ENCOUNTER — Other Ambulatory Visit: Payer: Self-pay

## 2020-10-12 MED ORDER — SIMVASTATIN 20 MG PO TABS: 20.0000 mg | ORAL_TABLET | Freq: Every day | ORAL | 1 refills | Status: DC

## 2020-10-15 ENCOUNTER — Other Ambulatory Visit: Payer: Self-pay

## 2020-10-15 MED ORDER — CLORAZEPATE DIPOTASSIUM 3.75 MG PO TABS: ORAL_TABLET | ORAL | 1 refills | Status: DC

## 2020-10-15 MED ORDER — CLORAZEPATE DIPOTASSIUM 3.75 MG PO TABS
ORAL_TABLET | ORAL | 1 refills | Status: DC
Start: 2020-10-15 — End: 2021-04-28

## 2020-10-18 ENCOUNTER — Telehealth: Payer: Self-pay | Admitting: Internal Medicine

## 2020-10-18 MED ORDER — SITAGLIPTIN PHOSPHATE 100 MG PO TABS: 100.0000 mg | ORAL_TABLET | Freq: Every day | ORAL | 0 refills | Status: DC

## 2020-10-18 MED ORDER — BUPROPION HCL ER (XL) 150 MG PO TB24: ORAL_TABLET | ORAL | 0 refills | Status: DC

## 2020-10-18 MED ORDER — METFORMIN HCL 500 MG PO TABS: ORAL_TABLET | ORAL | 0 refills | Status: DC

## 2020-10-18 MED ORDER — SERTRALINE HCL 100 MG PO TABS: ORAL_TABLET | ORAL | 0 refills | Status: DC

## 2020-10-18 NOTE — Telephone Encounter (Signed)
Patient has checked with mail order pharmacy several times and her medications are somewhere in transit, and they do not know when she is going to get them. They are supposedly in [place], for last couple of days. I called to let her know prescriptions were being sent to local pharmacy.

## 2020-10-18 NOTE — Telephone Encounter (Signed)
Please have her call mail order pharmacy and see where the order is before we do this.

## 2020-10-18 NOTE — Telephone Encounter (Signed)
Sen to local pharmacy.

## 2020-10-18 NOTE — Telephone Encounter (Signed)
Diana George [phone]  [name] called to say she has not received any of her medications from her mail order pharmacy, is there any way to send them to her local pharmacy below to get her thru till she receives them from mail order. She would like [place] to call and let her know if we can do this, because she is completely out of her medications.  buPROPion (WELLBUTRIN XL) 150 MG 24 hr tablet JANUVIA 100 MG tablet metFORMIN (GLUCOPHAGE) 500 MG tablet sertraline (ZOLOFT) 100 MG tablet     [id number] - [place], [place] - [address] AT [facility] [address] Phone:  [phone]  Fax:  [phone]

## 2020-11-03 DIAGNOSIS — L578 Other skin changes due to chronic exposure to nonionizing radiation: Secondary | ICD-10-CM | POA: Diagnosis not present

## 2020-11-03 DIAGNOSIS — L814 Other melanin hyperpigmentation: Secondary | ICD-10-CM | POA: Diagnosis not present

## 2020-11-03 DIAGNOSIS — L821 Other seborrheic keratosis: Secondary | ICD-10-CM | POA: Diagnosis not present

## 2020-11-03 DIAGNOSIS — D225 Melanocytic nevi of trunk: Secondary | ICD-10-CM | POA: Diagnosis not present

## 2020-11-15 ENCOUNTER — Other Ambulatory Visit: Payer: Medicare Other | Admitting: Internal Medicine

## 2020-11-16 ENCOUNTER — Ambulatory Visit: Payer: Medicare Other | Admitting: Internal Medicine

## 2020-11-23 ENCOUNTER — Other Ambulatory Visit: Payer: Medicare Other | Admitting: Internal Medicine

## 2020-11-23 ENCOUNTER — Other Ambulatory Visit: Payer: Self-pay

## 2020-11-23 DIAGNOSIS — E1169 Type 2 diabetes mellitus with other specified complication: Secondary | ICD-10-CM | POA: Diagnosis not present

## 2020-11-23 DIAGNOSIS — G4733 Obstructive sleep apnea (adult) (pediatric): Secondary | ICD-10-CM

## 2020-11-23 DIAGNOSIS — E785 Hyperlipidemia, unspecified: Secondary | ICD-10-CM | POA: Diagnosis not present

## 2020-11-23 DIAGNOSIS — E782 Mixed hyperlipidemia: Secondary | ICD-10-CM

## 2020-11-23 DIAGNOSIS — F32A Depression, unspecified: Secondary | ICD-10-CM

## 2020-11-23 DIAGNOSIS — K580 Irritable bowel syndrome with diarrhea: Secondary | ICD-10-CM

## 2020-11-23 DIAGNOSIS — N183 Chronic kidney disease, stage 3 unspecified: Secondary | ICD-10-CM

## 2020-11-23 DIAGNOSIS — Z6832 Body mass index (BMI) 32.0-32.9, adult: Secondary | ICD-10-CM

## 2020-11-23 DIAGNOSIS — E119 Type 2 diabetes mellitus without complications: Secondary | ICD-10-CM

## 2020-11-23 DIAGNOSIS — G250 Essential tremor: Secondary | ICD-10-CM

## 2020-11-23 DIAGNOSIS — J90 Pleural effusion, not elsewhere classified: Secondary | ICD-10-CM

## 2020-11-23 DIAGNOSIS — N1831 Chronic kidney disease, stage 3a: Secondary | ICD-10-CM

## 2020-11-23 DIAGNOSIS — Z0000 Encounter for general adult medical examination without abnormal findings: Secondary | ICD-10-CM

## 2020-11-24 LAB — HEPATIC FUNCTION PANEL
AG Ratio: 2.2 (calc) (ref 1.0–2.5)
ALT: 19 U/L (ref 6–29)
AST: 19 U/L (ref 10–35)
Albumin: 4.3 g/dL (ref 3.6–5.1)
Alkaline phosphatase (APISO): 76 U/L (ref 37–153)
Bilirubin, Direct: 0.1 mg/dL (ref 0.0–0.2)
Globulin: 2 g/dL (calc) (ref 1.9–3.7)
Indirect Bilirubin: 0.4 mg/dL (calc) (ref 0.2–1.2)
Total Bilirubin: 0.5 mg/dL (ref 0.2–1.2)
Total Protein: 6.3 g/dL (ref 6.1–8.1)

## 2020-11-24 LAB — MICROALBUMIN / CREATININE URINE RATIO
Creatinine, Urine: 207 mg/dL (ref 20–275)
Microalb Creat Ratio: 15 mcg/mg creat (ref <30)
Microalb, Ur: 3.1 mg/dL

## 2020-11-24 LAB — LIPID PANEL
Cholesterol: 138 mg/dL (ref <200)
HDL: 48 mg/dL — ABNORMAL LOW (ref >=50)
LDL Cholesterol (Calc): 68 mg/dL (calc)
Non-HDL Cholesterol (Calc): 90 mg/dL (calc) (ref <130)
Total CHOL/HDL Ratio: 2.9 (calc) (ref <5.0)
Triglycerides: 140 mg/dL (ref <150)

## 2020-11-24 LAB — HEMOGLOBIN A1C
Hgb A1c MFr Bld: 8 % of total Hgb — ABNORMAL HIGH (ref <5.7)
Mean Plasma Glucose: 183 mg/dL
eAG (mmol/L): 10.1 mmol/L

## 2020-11-25 ENCOUNTER — Telehealth: Payer: Self-pay | Admitting: Internal Medicine

## 2020-11-25 ENCOUNTER — Telehealth (INDEPENDENT_AMBULATORY_CARE_PROVIDER_SITE_OTHER): Payer: Medicare Other | Admitting: Internal Medicine

## 2020-11-25 ENCOUNTER — Encounter: Payer: Self-pay | Admitting: Internal Medicine

## 2020-11-25 ENCOUNTER — Ambulatory Visit: Payer: Medicare Other | Admitting: Internal Medicine

## 2020-11-25 VITALS — Temp 90.5°F | Ht 65.0 in

## 2020-11-25 DIAGNOSIS — E785 Hyperlipidemia, unspecified: Secondary | ICD-10-CM

## 2020-11-25 DIAGNOSIS — E1169 Type 2 diabetes mellitus with other specified complication: Secondary | ICD-10-CM

## 2020-11-25 DIAGNOSIS — Z20822 Contact with and (suspected) exposure to covid-19: Secondary | ICD-10-CM | POA: Diagnosis not present

## 2020-11-25 MED ORDER — AZITHROMYCIN 250 MG PO TABS: ORAL_TABLET | ORAL | 0 refills | Status: DC

## 2020-11-25 NOTE — Progress Notes (Addendum)
   Subjective:    Patient ID: ITZAE MCCURDY, female    DOB: [birth year], 75 y.o.   MRN: [id number]  HPI 75 year old Female called with complaint of sore throat onset yesterday, postnasal drip, no energy, weakness.  On Tuesday, she came here to have labs drawn, went to [facility] and then went to work at the [employer].  Son had Covid 2 weeks ago.  She took a Covid test last night and it was negative.  It may be too soon to turn positive.  I recommended a PCR test today.  We attempted virtual connection today but VPN global connect was down and we could not connect by video.  We had to continue with telephone only.  She is identified using 2 identifiers as Ilayda Toda. Hintz, a patient in this practice.  She is agreeable to visit in this format today.  She is at her home and I am at my office.    Review of Systems --see above- has respiratory congestion     Objective:   Physical Exam  I do not think her temp at home is correct. She should recheck temp. She was advised to purchase pulse oximeter, walk about the house and quarantine until PCR test is back.   reports nasal congestion and headache    Assessment & Plan:  Possible Covid-19  Plan: Quarantine at home after getting PCR test at test center.Monitor pulse ox. Monitor accuchecks. Drink plenty of fluids.. Take Zithromax Z pack 2 tabs day 1 followed by one tab daily days 2-5.  Time spent reviewing records, speaking with and assessing patient via phone, medical decision making and e-scribing medication is 15 minutes.   Addendum November 27, 2020: called patient she did not get PCR test as requested. Sounds nasally congested but says she is feeling better on antibiotics. Took second home test and it was negative. Advise PCR test this weekend or Monday.

## 2020-11-25 NOTE — Telephone Encounter (Addendum)
Gregoria Selvy [phone]  Diana George had 6 month follow up this morning that we canceled because started getting yesterday. She took at home COVID test it was negative, but she sleep all day, sore throat, coughing weak, no fever had COVID booster in October

## 2020-11-25 NOTE — Telephone Encounter (Signed)
Scheduled

## 2020-11-25 NOTE — Patient Instructions (Signed)
Take Zithromax 2 tabs day 1 followed by one tab days 2-5. Monitor pulse ox at home. Tylenol for fever or muscle aches. Stay well hydrated and quarantine at home for 5-7 days. Need PCR test today.

## 2020-12-01 NOTE — Telephone Encounter (Signed)
Patient called to say she had gone to [facility] for a COVID test and it has come back negative, so she was very happy about that. She is feeling much better. She also said Thank You very much for calling and checking on her over the weekend.

## 2020-12-21 ENCOUNTER — Other Ambulatory Visit: Payer: Self-pay

## 2020-12-21 MED ORDER — SIMVASTATIN 20 MG PO TABS: 20.0000 mg | ORAL_TABLET | Freq: Every day | ORAL | 1 refills | Status: DC

## 2020-12-28 ENCOUNTER — Encounter: Payer: Self-pay | Admitting: Internal Medicine

## 2020-12-28 ENCOUNTER — Telehealth: Payer: Self-pay | Admitting: Internal Medicine

## 2020-12-28 ENCOUNTER — Ambulatory Visit (INDEPENDENT_AMBULATORY_CARE_PROVIDER_SITE_OTHER): Payer: Medicare Other | Admitting: Internal Medicine

## 2020-12-28 ENCOUNTER — Other Ambulatory Visit: Payer: Self-pay

## 2020-12-28 VITALS — BP 120/80 | HR 90 | Ht 65.0 in | Wt 189.0 lb

## 2020-12-28 DIAGNOSIS — N1831 Chronic kidney disease, stage 3a: Secondary | ICD-10-CM | POA: Diagnosis not present

## 2020-12-28 DIAGNOSIS — E119 Type 2 diabetes mellitus without complications: Secondary | ICD-10-CM

## 2020-12-28 DIAGNOSIS — E785 Hyperlipidemia, unspecified: Secondary | ICD-10-CM

## 2020-12-28 DIAGNOSIS — Z9889 Other specified postprocedural states: Secondary | ICD-10-CM

## 2020-12-28 DIAGNOSIS — F32A Depression, unspecified: Secondary | ICD-10-CM

## 2020-12-28 DIAGNOSIS — E782 Mixed hyperlipidemia: Secondary | ICD-10-CM

## 2020-12-28 DIAGNOSIS — E1169 Type 2 diabetes mellitus with other specified complication: Secondary | ICD-10-CM | POA: Diagnosis not present

## 2020-12-28 DIAGNOSIS — F419 Anxiety disorder, unspecified: Secondary | ICD-10-CM

## 2020-12-28 DIAGNOSIS — J301 Allergic rhinitis due to pollen: Secondary | ICD-10-CM

## 2020-12-28 DIAGNOSIS — Z87442 Personal history of urinary calculi: Secondary | ICD-10-CM

## 2020-12-28 DIAGNOSIS — G4733 Obstructive sleep apnea (adult) (pediatric): Secondary | ICD-10-CM

## 2020-12-28 DIAGNOSIS — J3089 Other allergic rhinitis: Secondary | ICD-10-CM

## 2020-12-28 NOTE — Telephone Encounter (Signed)
Requested Medical records , Office notes, 2 years, All Bone Densities, All Mammograms from Dr Zelphia Cairo [phone], phone [phone]

## 2020-12-28 NOTE — Progress Notes (Signed)
   Subjective:    Patient ID: Diana George, female    DOB: 1946/09/06, 75 y.o.   MRN: [id number]  HPI 75 year old Female for 6 month follow up on DM and hyperlipidemia.  Hgb AIC is elevated at 7.4%. Says she was out of medication for about 3 weeks. There was an issue with mail order pharmacy apparently.  Hyperlipidemia treated with statin medication.  Lipid panel is normal except for low HDL of 48 and previously was 53 in July 2021.  She has history of kidney stones right right kidney requiring lithotripsy in 2016.  In 2002 had an episode of syncope.  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  She also had mild mitral regurgitation.  History of anxiety and depression longstanding.  Was seen by Dr. 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  History of chronic kidney disease followed by [facility] and stable.  History of allergic rhinitis with reaction to molds, tree pollens, house dust and dust mite.  Spirometry was normal.   She says she needs to see sleep specialist. Has had issues with her CPAP apparatus. She did go to [facility] for this years ago she says and will be referred back there for evaluation.  Review of Systems see above has chronic issues with anxiety and depression longstanding.     Objective:   Physical Exam Blood pressure 120/80 pulse 90 pulse oximetry 95% weight 189 pounds BMI 31.45  Skin is warm and dry.  No cervical adenopathy.  No thyromegaly.  No carotid bruits.  Chest is clear to auscultation.  Cardiac exam: Regular rate and rhythm normal S1 and S2.  No lower extremity pitting edema.       Assessment & Plan:  History of sleep apnea-referral back to all of our pulmonary for evaluation of CPAP device/adjustments  Anxiety depression-continue current treatment  Type 2 diabetes mellitus-was out of medication.  Needs to get back on track with diet and  exercise  History of mixed hyperlipidemia-stable on statin medication  Anxiety depression treated with Zoloft and Wellbutrin  History of SVT status post ablation in the remote past  Stage IIIa chronic kidney disease followed by Nephrology  Plan: She will return here in July for Medicare wellness and health maintenance exam.  She will see pulmonary for evaluation of CPAP device.  Encourage getting back on track with diet and exercise to control type 2 diabetes mellitus.  Continue statin medication.

## 2020-12-29 LAB — HEMOGLOBIN A1C
Hgb A1c MFr Bld: 7.4 % of total Hgb — ABNORMAL HIGH (ref <5.7)
Mean Plasma Glucose: 166 mg/dL
eAG (mmol/L): 9.2 mmol/L

## 2020-12-30 ENCOUNTER — Encounter: Payer: Self-pay | Admitting: Internal Medicine

## 2020-12-30 NOTE — Telephone Encounter (Signed)
Records received

## 2021-01-06 NOTE — Patient Instructions (Signed)
Get back on track with diet and exercise regimen.  Continue medications for type 2 diabetes mellitus and hyperlipidemia.  Continue Zoloft and Wellbutrin for anxiety/depression.  Continue follow-up with nephrology for stage III chronic kidney disease.  Referral to pulmonary regarding sleep apnea device.  Return here in July for Medicare wellness exam and health maintenance exam.

## 2021-01-19 ENCOUNTER — Ambulatory Visit (INDEPENDENT_AMBULATORY_CARE_PROVIDER_SITE_OTHER): Payer: Medicare Other | Admitting: Pulmonary Disease

## 2021-01-19 ENCOUNTER — Encounter: Payer: Self-pay | Admitting: Pulmonary Disease

## 2021-01-19 ENCOUNTER — Other Ambulatory Visit: Payer: Self-pay

## 2021-01-19 VITALS — BP 116/80 | HR 83 | Temp 98.0°F | Ht 66.0 in | Wt 191.0 lb

## 2021-01-19 DIAGNOSIS — Z9989 Dependence on other enabling machines and devices: Secondary | ICD-10-CM

## 2021-01-19 DIAGNOSIS — G4733 Obstructive sleep apnea (adult) (pediatric): Secondary | ICD-10-CM | POA: Diagnosis not present

## 2021-01-19 NOTE — Progress Notes (Signed)
Diana George    [id number]    August 06, 1946  Primary Care Physician:Baxley, Luanna Cole, MD  Referring Physician: Margaree Mackintosh, MD [address] [place],  [place] [address]  Chief complaint:   Patient has been evaluated for obstructive sleep apnea  HPI:  Diagnosis obstructive sleep apnea in 2005 Has been using CPAP since then Repeat study in 2015 did reveal severe obstructive sleep apnea, initial study was moderate obstructive sleep apnea  She currently uses CPAP on a nightly basis, she is compliant with use but does not like using it Usually falls asleep between 12 and 1230 Takes only a few minutes to fall asleep but very significantly No significant awakening Final wake up time about 4 AM  She does not feel she is restored whenever she wakes up in the morning takes about an hour to get herself going Never smoker No family history of sleep apnea  Currently uses nasal pillows  She has heart rhythm problems, diabetes,  Outpatient Encounter Medications as of 01/19/2021  Medication Sig  . buPROPion (WELLBUTRIN XL) 150 MG 24 hr tablet TAKE 1 TABLET BY MOUTH DAILY, GENERIC EQUIVALENT FOR WELLBUTRIN XL  . Cholecalciferol (VITAMIN D) 2000 UNITS CAPS Take 1 capsule by mouth 2 (two) times daily.  . clorazepate (TRANXENE) 3.75 MG tablet TAKE 1 TABLET po TWICE DAILY prn ANXIETY. GENERIC EQUIVALENT FOR TRANXENE.  Marland Kitchen Hyoscyamine Sulfate SL (LEVSIN/SL) 0.125 MG SUBL Place 0.125 tablets under the tongue 3 (three) times daily before meals. (Patient taking differently: Place 0.125 tablets under the tongue as needed.)  . meloxicam (MOBIC) 15 MG tablet Take 1 tablet (15 mg total) by mouth daily.  . metFORMIN (GLUCOPHAGE) 500 MG tablet TAKE 1 TABLET BY MOUTH TWICE DAILY, GENERIC EQUIVALENT FOR GLUCOPHAGE  . sertraline (ZOLOFT) 100 MG tablet TAKE 1 TABLET BY MOUTH DAILY GENERIC EQUIVALENT FOR ZOLOFT  . simvastatin (ZOCOR) 20 MG tablet Take 1 tablet (20 mg total) by mouth daily.  .  sitaGLIPtin (JANUVIA) 100 MG tablet Take 1 tablet (100 mg total) by mouth daily.   No facility-administered encounter medications on file as of 01/19/2021.    Allergies as of 01/19/2021 - Review Complete 01/19/2021  Allergen Reaction Noted  . Amoxicillin Rash 06/09/2011    Past Medical History:  Diagnosis Date  . Anxiety   . Depression   . Diabetes mellitus   . History of coronary rotational ablation 2006  . Hyperlipidemia   . Renal stone   . Sleep apnea    uses cpap    Past Surgical History:  Procedure Laterality Date  . CATARACT EXTRACTION W/ INTRAOCULAR LENS  IMPLANT, BILATERAL  2010  . heart ablation  2010  . LEG SURGERY Left 2011   fracture with hardware  . STONE EXTRACTION WITH BASKET  2016, 1977  . TUBAL LIGATION  1977    Family History  Problem Relation Age of Onset  . Alzheimer's disease Mother   . Alzheimer's disease Father   . Heart disease Father   . Colon cancer Father 44    Social History   Socioeconomic History  . Marital status: Divorced    Spouse name: Not on file  . Number of children: Not on file  . Years of education: Not on file  . Highest education level: Not on file  Occupational History  . Occupation: retired  Tobacco Use  . Smoking status: Never Smoker  . Smokeless tobacco: Never Used  Substance and Sexual Activity  .  Alcohol use: No    Alcohol/week: 0.0 standard drinks  . Drug use: No  . Sexual activity: Not on file  Other Topics Concern  . Not on file  Social History Narrative  . Not on file   Social Determinants of Health   Financial Resource Strain: Not on file  Food Insecurity: Not on file  Transportation Needs: Not on file  Physical Activity: Not on file  Stress: Not on file  Social Connections: Not on file  Intimate Partner Violence: Not on file    Review of Systems  Respiratory: Positive for apnea.   Psychiatric/Behavioral: Positive for sleep disturbance.    Vitals:   01/19/21 1452  BP: 116/80  Pulse: 83   Temp: 98 F (36.7 C)  SpO2: 96%     Physical Exam Constitutional:      Appearance: She is obese.  HENT:     Head: Normocephalic and atraumatic.     Nose: No congestion.     Mouth/Throat:     Mouth: Mucous membranes are moist.     Comments: Mallampati 3, crowded oropharynx Eyes:     General: No scleral icterus.       Right eye: No discharge.        Left eye: No discharge.     Pupils: Pupils are equal, round, and reactive to light.  Cardiovascular:     Rate and Rhythm: Normal rate and regular rhythm.     Heart sounds: No murmur heard. No friction rub.  Pulmonary:     Effort: No respiratory distress.     Breath sounds: No stridor. No wheezing or rhonchi.  Musculoskeletal:     Cervical back: No rigidity or tenderness.  Neurological:     Mental Status: She is alert.  Psychiatric:        Mood and Affect: Mood normal.     Data Reviewed: Compliance data shows 84% compliance average use of 6 hours 31 minutes Machine set between 5 and 12 Residual AHI of 3.6  Assessment:  Obstructive sleep apnea -Adequately treated with CPAP therapy  Pathophysiology of sleep disordered breathing reviewed with the patient  Treatment options for sleep disordered breathing discussed with the patient  Had a discussion with patient about an inspire device  Plan/Recommendations: Follow-up in 6 months  Will continue using CPAP  Graded exercise as tolerated  Encouraged to call with any significant concerns  I spent 45 minutes dedicated to the care of this patient on the date of this encounter to include previsit review of records, face-to-face time with the patient discussing conditions above, post visit ordering of testing, clinical documentation with electronic health record and communicated necessary findings to members of the patient's care team  Virl Diamond MD [facility] 01/19/2021, 3:16 PM  CC: Margaree Mackintosh, MD

## 2021-01-19 NOTE — Patient Instructions (Signed)
Severe obstructive sleep apnea -Continue using CPAP on a nightly basis  Regular exercises will help sleep quality  Weight loss efforts will help  Call with significant concerns  I will see you back in 6 months

## 2021-02-16 DIAGNOSIS — Z124 Encounter for screening for malignant neoplasm of cervix: Secondary | ICD-10-CM | POA: Diagnosis not present

## 2021-02-16 DIAGNOSIS — Z6830 Body mass index (BMI) 30.0-30.9, adult: Secondary | ICD-10-CM | POA: Diagnosis not present

## 2021-02-16 DIAGNOSIS — Z01419 Encounter for gynecological examination (general) (routine) without abnormal findings: Secondary | ICD-10-CM | POA: Diagnosis not present

## 2021-02-16 DIAGNOSIS — Z1231 Encounter for screening mammogram for malignant neoplasm of breast: Secondary | ICD-10-CM | POA: Diagnosis not present

## 2021-03-19 ENCOUNTER — Other Ambulatory Visit: Payer: Self-pay | Admitting: Internal Medicine

## 2021-03-21 ENCOUNTER — Other Ambulatory Visit: Payer: Self-pay

## 2021-03-21 MED ORDER — BUPROPION HCL ER (XL) 150 MG PO TB24: ORAL_TABLET | ORAL | 0 refills | Status: DC

## 2021-04-14 ENCOUNTER — Other Ambulatory Visit: Payer: Self-pay | Admitting: Internal Medicine

## 2021-04-14 NOTE — Telephone Encounter (Signed)
Received Fax RX request from  Pharmacy -  [facility]) [facility] - [place], [place] - [address] Phone:  [phone]  Fax:  [phone]      Medication - clorazepate (TRANXENE) 3.75 MG tablet  Last Refill -   Last OV -  12/28/20  Last CPE - 05/18/21  Next Appointment - 05/20/21

## 2021-04-28 ENCOUNTER — Telehealth: Payer: Self-pay | Admitting: Internal Medicine

## 2021-04-28 MED ORDER — HYOSCYAMINE SULFATE SL 0.125 MG SL SUBL: 0.1250 | SUBLINGUAL_TABLET | SUBLINGUAL | 1 refills | Status: DC | PRN

## 2021-04-28 MED ORDER — CLORAZEPATE DIPOTASSIUM 3.75 MG PO TABS: ORAL_TABLET | ORAL | 1 refills | Status: DC

## 2021-04-28 NOTE — Addendum Note (Signed)
Addended by: Gregery Na on: 04/28/2021 03:11 PM   Modules accepted: Orders

## 2021-04-28 NOTE — Telephone Encounter (Signed)
Pended. Please advise.

## 2021-04-28 NOTE — Telephone Encounter (Signed)
Refill Levsin locally patient request. Refill Tranxene for 6 months to mail order pharmacy

## 2021-04-28 NOTE — Telephone Encounter (Signed)
Patient called about this medication pharmacy said they had never received refill.

## 2021-04-28 NOTE — Telephone Encounter (Signed)
Diana George [phone]  [name] called to check on a refill and she wanted to also inquire about getting the medication that goes under her tongue that she got back in 2019. She said it really helped and she has misplaced the bottle she had.

## 2021-05-16 ENCOUNTER — Other Ambulatory Visit: Payer: Self-pay

## 2021-05-16 ENCOUNTER — Other Ambulatory Visit: Payer: Medicare Other | Admitting: Internal Medicine

## 2021-05-16 DIAGNOSIS — E119 Type 2 diabetes mellitus without complications: Secondary | ICD-10-CM | POA: Diagnosis not present

## 2021-05-16 DIAGNOSIS — Z0000 Encounter for general adult medical examination without abnormal findings: Secondary | ICD-10-CM | POA: Diagnosis not present

## 2021-05-16 DIAGNOSIS — F32A Depression, unspecified: Secondary | ICD-10-CM

## 2021-05-16 DIAGNOSIS — E785 Hyperlipidemia, unspecified: Secondary | ICD-10-CM | POA: Diagnosis not present

## 2021-05-16 DIAGNOSIS — G4733 Obstructive sleep apnea (adult) (pediatric): Secondary | ICD-10-CM

## 2021-05-16 DIAGNOSIS — Z6832 Body mass index (BMI) 32.0-32.9, adult: Secondary | ICD-10-CM

## 2021-05-16 DIAGNOSIS — F419 Anxiety disorder, unspecified: Secondary | ICD-10-CM

## 2021-05-16 DIAGNOSIS — E1169 Type 2 diabetes mellitus with other specified complication: Secondary | ICD-10-CM

## 2021-05-16 DIAGNOSIS — K580 Irritable bowel syndrome with diarrhea: Secondary | ICD-10-CM

## 2021-05-16 DIAGNOSIS — M7918 Myalgia, other site: Secondary | ICD-10-CM

## 2021-05-16 DIAGNOSIS — N1831 Chronic kidney disease, stage 3a: Secondary | ICD-10-CM

## 2021-05-16 DIAGNOSIS — E782 Mixed hyperlipidemia: Secondary | ICD-10-CM

## 2021-05-17 LAB — LIPID PANEL
Cholesterol: 147 mg/dL (ref <200)
HDL: 46 mg/dL — ABNORMAL LOW (ref >=50)
LDL Cholesterol (Calc): 80 mg/dL (calc)
Non-HDL Cholesterol (Calc): 101 mg/dL (calc) (ref <130)
Total CHOL/HDL Ratio: 3.2 (calc) (ref <5.0)
Triglycerides: 112 mg/dL (ref <150)

## 2021-05-17 LAB — CBC WITH DIFFERENTIAL/PLATELET
Absolute Monocytes: 490 cells/uL (ref 200–950)
Basophils Absolute: 57 cells/uL (ref 0–200)
Basophils Relative: 0.8 %
Eosinophils Absolute: 142 cells/uL (ref 15–500)
Eosinophils Relative: 2 %
HCT: 40.9 % (ref 35.0–45.0)
Hemoglobin: 13.2 g/dL (ref 11.7–15.5)
Lymphs Abs: 2237 cells/uL (ref 850–3900)
MCH: 31.1 pg (ref 27.0–33.0)
MCHC: 32.3 g/dL (ref 32.0–36.0)
MCV: 96.2 fL (ref 80.0–100.0)
MPV: 11.6 fL (ref 7.5–12.5)
Monocytes Relative: 6.9 %
Neutro Abs: 4175 cells/uL (ref 1500–7800)
Neutrophils Relative %: 58.8 %
Platelets: 314 10*3/uL (ref 140–400)
RBC: 4.25 10*6/uL (ref 3.80–5.10)
RDW: 12.1 % (ref 11.0–15.0)
Total Lymphocyte: 31.5 %
WBC: 7.1 10*3/uL (ref 3.8–10.8)

## 2021-05-17 LAB — COMPLETE METABOLIC PANEL WITH GFR
AG Ratio: 2.3 (calc) (ref 1.0–2.5)
ALT: 21 U/L (ref 6–29)
AST: 20 U/L (ref 10–35)
Albumin: 4.5 g/dL (ref 3.6–5.1)
Alkaline phosphatase (APISO): 51 U/L (ref 37–153)
BUN/Creatinine Ratio: 16 (calc) (ref 6–22)
BUN: 19 mg/dL (ref 7–25)
CO2: 24 mmol/L (ref 20–32)
Calcium: 9.1 mg/dL (ref 8.6–10.4)
Chloride: 109 mmol/L (ref 98–110)
Creat: 1.16 mg/dL — ABNORMAL HIGH (ref 0.60–1.00)
Globulin: 2 g/dL (calc) (ref 1.9–3.7)
Glucose, Bld: 110 mg/dL — ABNORMAL HIGH (ref 65–99)
Potassium: 4.5 mmol/L (ref 3.5–5.3)
Sodium: 142 mmol/L (ref 135–146)
Total Bilirubin: 0.7 mg/dL (ref 0.2–1.2)
Total Protein: 6.5 g/dL (ref 6.1–8.1)
eGFR: 49 mL/min/{1.73_m2} — ABNORMAL LOW (ref >=60)

## 2021-05-17 LAB — HEMOGLOBIN A1C
Hgb A1c MFr Bld: 6.1 % of total Hgb — ABNORMAL HIGH (ref <5.7)
Mean Plasma Glucose: 128 mg/dL
eAG (mmol/L): 7.1 mmol/L

## 2021-05-17 LAB — TSH: TSH: 2.07 mIU/L (ref 0.40–4.50)

## 2021-05-20 ENCOUNTER — Encounter: Payer: Self-pay | Admitting: Internal Medicine

## 2021-05-20 ENCOUNTER — Other Ambulatory Visit: Payer: Self-pay

## 2021-05-20 ENCOUNTER — Ambulatory Visit (INDEPENDENT_AMBULATORY_CARE_PROVIDER_SITE_OTHER): Payer: Medicare Other | Admitting: Internal Medicine

## 2021-05-20 VITALS — BP 110/80 | HR 96 | Ht 66.0 in | Wt 176.0 lb

## 2021-05-20 DIAGNOSIS — Z0000 Encounter for general adult medical examination without abnormal findings: Secondary | ICD-10-CM

## 2021-05-20 DIAGNOSIS — E1169 Type 2 diabetes mellitus with other specified complication: Secondary | ICD-10-CM

## 2021-05-20 DIAGNOSIS — J301 Allergic rhinitis due to pollen: Secondary | ICD-10-CM

## 2021-05-20 DIAGNOSIS — N1831 Chronic kidney disease, stage 3a: Secondary | ICD-10-CM | POA: Diagnosis not present

## 2021-05-20 DIAGNOSIS — G4733 Obstructive sleep apnea (adult) (pediatric): Secondary | ICD-10-CM | POA: Diagnosis not present

## 2021-05-20 DIAGNOSIS — E782 Mixed hyperlipidemia: Secondary | ICD-10-CM | POA: Diagnosis not present

## 2021-05-20 DIAGNOSIS — J3089 Other allergic rhinitis: Secondary | ICD-10-CM

## 2021-05-20 DIAGNOSIS — E785 Hyperlipidemia, unspecified: Secondary | ICD-10-CM

## 2021-05-20 DIAGNOSIS — Z87442 Personal history of urinary calculi: Secondary | ICD-10-CM

## 2021-05-20 NOTE — Progress Notes (Signed)
Subjective:    Patient ID: Diana George, female    DOB: [birth year], 75 y.o.   MRN: [id number]  HPI 75 year old Female seen for Medicare wellness, health maintenance exam and evaluation of medical issues.  She has diabetes mellitus, hyperlipidemia, anxiety and depression.  Had lithotripsy for kidney stones right renal pelvis and right lower pole of the kidney in 2016.  History of abnormal Cardiolite study 2002.  Echocardiogram at that time showed mild prolapse of the anterior leaflet of the mitral valve.  Study was done for syncope.  She has mild mitral regurgitation.  Was seen by Dr. 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  She then had a catheter ablation for this in September 2005.  History of chronic kidney disease followed by [facility] and stable.  History of bilateral tubal ligation 1980.  Stone basket extraction for kidney stones in the mid 1970s.  History of fractured left wrist November 2007 due to a fall in addition to right anterior cruciate ligament tear anterior and posterior horn of medial meniscus.   In April 2022 she was seen by Dr. Renaldo Fiddler, GYN physician and bone density study showed osteopenia with T score -2.4 in the left femoral neck, right femoral neck and right forearm.  She was started on Boniva.  Was advised to take vitamin D supplement and exercise. Review of Systems Colonoscopy 2016 with 10 year follow up.     Objective:   Physical Exam    BP 110/80, pulse 96,  pulse ox 97% ,weight 176 pounds, BMI 28.41  Weight last year was 191 pounds  Skin: Warm and dry.  Nodes none.  TMs clear.  Neck supple.  No carotid bruits.  No thyromegaly.  Chest is clear.  Cardiac exam: Regular rate and rhythm without ectopy.  Abdomen soft nondistended without hepatosplenomegaly, masses or tenderness.  There is no lower extremity edema or significant deformity.  Neuro is intact without focal deficits.  Affect, thought, and judgment are normal.         Assessment & Plan:  Hyperlipidemia with normal total cholesterol, triglycerides and LDL cholesterol.  Currently on Zocor 20 mg daily  Low HDL of 46-used to be better a couple of years ago in the 47s and should try to increase her exercise.  This will help her HDL  History of mild chronic kidney disease stage IIIa-stable estimated GFR 49 cc/min  History of anxiety and depression treated with Tranxene, Zoloft and Wellbutrin  Type 2 diabetes mellitus treated with Januvia and metformin  Hyperlipidemia treated with simvastatin  Musculoskeletal pain has been treated with meloxicam  Irritable bowel syndrome has been treated with Levsin sublingual  Osteopenia treated with Boniva- T score -2.1.  Sandrea Hammond was restarted in 2021 by GYN Dr. Renaldo Fiddler.  I agree with this treatment.  Plan: Continue current medications and follow-up in 6 months.  Encouraged diet and exercise. COVID booster due in the Fall.   Subjective:   Patient presents for Medicare Annual/Subsequent preventive examination.  Review Past Medical/Family/Social: See above   Risk Factors  Current exercise habits: Mild exercise Dietary issues discussed: Low-fat low carbohydrate  Cardiac risk factors: Diabetes, hyperlipidemia  Depression Screen  (Note: if answer to either of the following is "Yes", a more complete depression screening is indicated)   Over the past two weeks, have you felt down, depressed or hopeless? No  Over the past two weeks, have you felt little interest or pleasure in doing things? No Have  you lost interest or pleasure in daily life? No Do you often feel hopeless? No Do you cry easily over simple problems? No   Activities of Daily Living  In your present state of health, do you have any difficulty performing the following activities?:   Driving? No  Managing money? No  Feeding yourself? No  Getting from bed to chair? No  Climbing a flight of stairs? No  Preparing food and eating?: No  Bathing or  showering? No  Getting dressed: No  Getting to the toilet? No  Using the toilet:No  Moving around from place to place: No  In the past year have you fallen or had a near fall?:No  Are you sexually active? No  Do you have more than one partner? No   Hearing Difficulties: No  Do you often ask people to speak up or repeat themselves? No  Do you experience ringing or noises in your ears?  Yes Do you have difficulty understanding soft or whispered voices? No  Do you feel that you have a problem with memory?  Sometimes Do you often misplace items? No    Home Safety:  Do you have a smoke alarm at your residence? Yes Do you have grab bars in the bathroom?  Yes Do you have throw rugs in your house?  Yes   Cognitive Testing  Alert? Yes Normal Appearance?Yes  Oriented to person? Yes Place? Yes  Time? Yes  Recall of three objects? Yes  Can perform simple calculations? Yes  Displays appropriate judgment?Yes  Can read the correct time from a watch face?Yes   List the Names of Other Physician/Practitioners you currently use:  See referral list for the physicians patient is currently seeing.  GYN is Dr. Renaldo Fiddler   Review of Systems: See above   Objective:     General appearance: Appears younger than stated age Head: Normocephalic, without obvious abnormality, atraumatic  Eyes: conj clear, EOMi PEERLA  Ears: normal TM's and external ear canals both ears  Nose: Nares normal. Septum midline. Mucosa normal. No drainage or sinus tenderness.  Throat: lips, mucosa, and tongue normal; teeth and gums normal  Neck: no adenopathy, no carotid bruit, no JVD, supple, symmetrical, trachea midline and thyroid not enlarged, symmetric, no tenderness/mass/nodules  No CVA tenderness.  Lungs: clear to auscultation bilaterally  Breasts: normal appearance, no masses or tenderness Heart: regular rate and rhythm, S1, S2 normal, no murmur, click, rub or gallop  Abdomen: soft, non-tender; bowel sounds  normal; no masses, no organomegaly  Musculoskeletal: ROM normal in all joints, no crepitus, no deformity, Normal muscle strengthen. Back  is symmetric, no curvature. Skin: Skin color, texture, turgor normal. No rashes or lesions  Lymph nodes: Cervical, supraclavicular, and axillary nodes normal.  Neurologic: CN 2 -12 Normal, Normal symmetric reflexes. Normal coordination and gait  Psych: Alert & Oriented x 3, Mood appear stable.    Assessment:    Annual wellness medicare exam   Plan:    During the course of the visit the patient was educated and counseled about appropriate screening and preventive services including:   Tetanus immunization is due but Medicare will not pay for it unless she injures herself.  Have COVID booster in the fall.  Other immunizations are up-to-date.     Patient Instructions (the written plan) was given to the patient.  Medicare Attestation  I have personally reviewed:  The patient's medical and social history  Their use of alcohol, tobacco or illicit drugs  Their current medications  and supplements  The patient's functional ability including ADLs,fall risks, home safety risks, cognitive, and hearing and visual impairment  Diet and physical activities  Evidence for depression or mood disorders  The patient's weight, height, BMI, and visual acuity have been recorded in the chart. I have made referrals, counseling, and provided education to the patient based on review of the above and I have provided the patient with a written personalized care plan for preventive services.

## 2021-06-28 NOTE — Patient Instructions (Signed)
It was a pleasure to see you today.  Keep up the good work with management of lipids and hemoglobin A1c.  Return in 6 months.  Try to get a bit more exercise.

## 2021-07-04 ENCOUNTER — Telehealth: Payer: Self-pay | Admitting: Internal Medicine

## 2021-07-04 ENCOUNTER — Other Ambulatory Visit: Payer: Self-pay | Admitting: Internal Medicine

## 2021-07-04 ENCOUNTER — Encounter: Payer: Self-pay | Admitting: Internal Medicine

## 2021-07-04 MED ORDER — BUPROPION HCL ER (XL) 150 MG PO TB24: ORAL_TABLET | ORAL | 3 refills | Status: DC

## 2021-07-04 NOTE — Telephone Encounter (Signed)
Refilled Wellbutrin XL(Bupropion)150 mg #90 with 3 refills

## 2021-07-11 ENCOUNTER — Other Ambulatory Visit: Payer: Self-pay

## 2021-07-11 ENCOUNTER — Telehealth: Payer: Self-pay

## 2021-07-11 ENCOUNTER — Telehealth (INDEPENDENT_AMBULATORY_CARE_PROVIDER_SITE_OTHER): Payer: Medicare Other | Admitting: Internal Medicine

## 2021-07-11 ENCOUNTER — Encounter: Payer: Self-pay | Admitting: Internal Medicine

## 2021-07-11 VITALS — HR 95 | Temp 97.7°F | Ht 66.0 in | Wt 176.0 lb

## 2021-07-11 DIAGNOSIS — N1831 Chronic kidney disease, stage 3a: Secondary | ICD-10-CM | POA: Diagnosis not present

## 2021-07-11 DIAGNOSIS — E119 Type 2 diabetes mellitus without complications: Secondary | ICD-10-CM

## 2021-07-11 DIAGNOSIS — G4733 Obstructive sleep apnea (adult) (pediatric): Secondary | ICD-10-CM

## 2021-07-11 DIAGNOSIS — F32A Depression, unspecified: Secondary | ICD-10-CM

## 2021-07-11 DIAGNOSIS — E782 Mixed hyperlipidemia: Secondary | ICD-10-CM

## 2021-07-11 DIAGNOSIS — F419 Anxiety disorder, unspecified: Secondary | ICD-10-CM

## 2021-07-11 DIAGNOSIS — U071 COVID-19: Secondary | ICD-10-CM

## 2021-07-11 MED ORDER — NIRMATRELVIR/RITONAVIR (PAXLOVID) TABLET (RENAL DOSING): 2.0000 | ORAL_TABLET | Freq: Two times a day (BID) | ORAL | 0 refills | Status: AC

## 2021-07-11 NOTE — Telephone Encounter (Signed)
Appointment scheduled.

## 2021-07-11 NOTE — Progress Notes (Signed)
   Subjective:    Patient ID: TORII ROYSE, female    DOB: Apr 24, 1946, 75 y.o.   MRN: [id number]  HPI 75 year old Female seen today by interactive audio and video communications due to the Coronavirus pandemic.  Patient is agreeable to visit in this format today.  She is identified using 2 identifiers as Christan Ciccarelli. Hostler, a patient in this practice.  She is at her home and I am at my office.  She called today to say she had tested positive for COVID-19.  I records indicates she has had at least 3 COVID-19 immunizations.  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  No one was wearing a mask.  Several people have tested positive.  She has no fever.  She has malaise and fatigue.  Patient has a history of diabetes mellitus, hyperlipidemia, anxiety and depression.  She has obstructive sleep apnea and stage IIIa chronic kidney disease.  Patient is interested in Paxlovid therapy.  Review of Systems see above     Objective:   Physical Exam Patient reports temperature to be 97.7 pulse oximetry 96% and pulse of 95.  She looks pale but is able to give a clear concise history.  She looks fatigued.       Assessment & Plan:  Acute COVID-19 virus infection.  Plan: She has history of chronic kidney disease and needs to take renal failure dose of Paxlovid 2 tabs twice daily for 5 days.  She will call with progress report in a few days.  She is to stay well-hydrated.  Is to walk some to prevent atelectasis and is to monitor her vital signs including her temp. And pulse ox

## 2021-07-11 NOTE — Telephone Encounter (Signed)
Patient tested positive for covid today.  Current symptoms include cough, SOB, fatigue.

## 2021-07-13 NOTE — Telephone Encounter (Signed)
Diana George called today to say she is feeling better, not coughing as much, she has sleep a lot for the last 3 days. Her daughter wanted her to ask about the infusion. I let her know she got the oral dose of medication instead of getting the infusion that you do not get both. She did state she feels like the medicine has really helped.

## 2021-07-19 ENCOUNTER — Telehealth: Payer: Self-pay | Admitting: Internal Medicine

## 2021-07-19 NOTE — Telephone Encounter (Signed)
Faxed Positive Home COVID results to [facility] [phone], phone [phone]  Positive 07/11/2021 

## 2021-07-20 NOTE — Patient Instructions (Addendum)
Patient is to stay well-hydrated.  Take Paxlovid as directed.  Walk some to prevent atelectasis.  Monitor vital signs including temp and pulse oximetry.  Call if symptoms worsen.

## 2021-07-25 ENCOUNTER — Encounter: Payer: Self-pay | Admitting: Internal Medicine

## 2021-07-25 ENCOUNTER — Telehealth (INDEPENDENT_AMBULATORY_CARE_PROVIDER_SITE_OTHER): Payer: Medicare Other | Admitting: Internal Medicine

## 2021-07-25 DIAGNOSIS — E119 Type 2 diabetes mellitus without complications: Secondary | ICD-10-CM | POA: Diagnosis not present

## 2021-07-25 DIAGNOSIS — U071 COVID-19: Secondary | ICD-10-CM | POA: Diagnosis not present

## 2021-07-25 NOTE — Telephone Encounter (Signed)
Diana George called to say she had tested negative for COVID, however now she started feeling bad again and has tested positive again. Her voice is hoarse again, she is sneezing a lot, runny nose, just does not feel good, has been in the bed since the 24th. Would like to do a virtual visit.

## 2021-07-25 NOTE — Telephone Encounter (Addendum)
I connected by telephone with Diana George. Mcinerney, a patient in this practice.  She is at her home and I am at my office.  She is agreeable to visit in this format today.  She is identified using 2 identifiers as sharing [birth year], a patient in this practice for many years.  Patient had a video visit on September 12 indicating she had tested positive for COVID-19.  Our records indicated that she had received at least 3 COVID-19 immunizations.  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  No one was wearing a mask and several people tested positive.  She had no fever but complains of malaise and fatigue.  She has a history of diabetes mellitus, hyperlipidemia, anxiety and depression as well as obstructive sleep apnea and stage IIIa chronic kidney disease.  She was treated with Paxlovid.  Today she says that she tested negative last Wednesday, September 21.  She went to a meeting and then 2 days later tested positive once again.  She has been sneezing.  Has some coughing but no discolored sputum.  No documented fever.  Some malaise and is tired of being ill.  No shaking chills.  No nausea vomiting or diarrhea.  She sounds hoarse when she speaks.  Not heard to be coughing during the interview.  It is my opinion that she has Paxlovid rebound.  Her main complaint is just hoarseness and respiratory congestion.  I have suggested that she try over-the-counter antihistamine such as Zyrtec 10 mg daily.  Rest and drink plenty of fluids.  I think symptoms will improve within a few days.  I see no reason to use antibiotics or steroids.  She does have diabetes mellitus and I think steroids would increase her blood sugar.  She is agreeable to trying over-the-counter antihistamine.  She will stay well-hydrated and walk some and monitor her pulse oximetry which she says is normal today.  She will notify me if not improving in a couple of days.  Time spent with patient on the phone  assessing her symptoms and medical decision making is  15 minutes.

## 2021-08-22 ENCOUNTER — Other Ambulatory Visit: Payer: Self-pay

## 2021-08-22 ENCOUNTER — Ambulatory Visit (INDEPENDENT_AMBULATORY_CARE_PROVIDER_SITE_OTHER): Payer: Medicare Other

## 2021-08-22 DIAGNOSIS — Z23 Encounter for immunization: Secondary | ICD-10-CM | POA: Diagnosis not present

## 2021-08-22 NOTE — Telephone Encounter (Signed)
Patient also received a letter from insurance that they will no longer be making her medication clorazepate. It is on back order with no release date. She will need a medication to take place of that. Please advise.

## 2021-08-22 NOTE — Telephone Encounter (Signed)
scheduled

## 2021-08-22 NOTE — Telephone Encounter (Signed)
Patient states that she hasnt felt good since she had covid. No energy. She doesn't feel like doing anything at all. She just doesn't feel like herself. She had a shooting pain down her right leg on Sunday and it made it so she couldn't walk without her cane all day. She used ibuprofen and that helped. That is the only episode that she had. She states that people are staring to ask her whats wrong. She would like your advice.

## 2021-08-25 ENCOUNTER — Ambulatory Visit (INDEPENDENT_AMBULATORY_CARE_PROVIDER_SITE_OTHER): Payer: Medicare Other | Admitting: Internal Medicine

## 2021-08-25 ENCOUNTER — Other Ambulatory Visit: Payer: Self-pay

## 2021-08-25 DIAGNOSIS — M7918 Myalgia, other site: Secondary | ICD-10-CM

## 2021-08-25 DIAGNOSIS — F32A Depression, unspecified: Secondary | ICD-10-CM

## 2021-08-25 DIAGNOSIS — E119 Type 2 diabetes mellitus without complications: Secondary | ICD-10-CM

## 2021-08-25 DIAGNOSIS — R5383 Other fatigue: Secondary | ICD-10-CM | POA: Diagnosis not present

## 2021-08-25 DIAGNOSIS — E1169 Type 2 diabetes mellitus with other specified complication: Secondary | ICD-10-CM

## 2021-08-25 DIAGNOSIS — N1831 Chronic kidney disease, stage 3a: Secondary | ICD-10-CM

## 2021-08-25 DIAGNOSIS — R6889 Other general symptoms and signs: Secondary | ICD-10-CM | POA: Diagnosis not present

## 2021-08-25 DIAGNOSIS — R29898 Other symptoms and signs involving the musculoskeletal system: Secondary | ICD-10-CM | POA: Diagnosis not present

## 2021-08-25 DIAGNOSIS — E785 Hyperlipidemia, unspecified: Secondary | ICD-10-CM

## 2021-08-25 DIAGNOSIS — F419 Anxiety disorder, unspecified: Secondary | ICD-10-CM

## 2021-08-25 DIAGNOSIS — G4733 Obstructive sleep apnea (adult) (pediatric): Secondary | ICD-10-CM

## 2021-08-25 NOTE — Patient Instructions (Addendum)
We are going to do extensive lab work today.  Please check with your mail order pharmacy to see what options are for you to remain on Tranxene such as using a local pharmacy.  Please see counselor.  Labs drawn and pending.

## 2021-08-25 NOTE — Progress Notes (Signed)
    Subjective:    Patient ID: Diana George , female    DOB: 11-21-1945, 75 y.o.    MRN: [id number]   75 y.o. Female presents today for complaint of fatigue status post COVID-19.  She was diagnosed with COVID-19 in September.  She had a mild case and due to her medical issues was treated with Paxlovid. She has a history of depression, hyperlipidemia, type 2 diabetes mellitus, anxiety, osteopenia and musculoskeletal pain.  Admits she is depressed. Son and daughter-in law are getting a divorce.  She has been spending time with her grandchildren.  She says her legs are weak and she does not understand why.  Last week she had a severe pain in her right lateral leg just below her knee.  It lasted for some time but has improved.  She does not recall any type of physical overexertion to cause her leg to hurt.  Current medications include Wellbutrin, Januvia, Zocor, metformin, Boniva, Tranxene, Levsin if needed for irritable bowel syndrome and sotalol.  Takes meloxicam for musculoskeletal pain.  She had Medicare annual wellness visit in July just 3 months ago and her lab work was stable.  Hemoglobin A1c had improved from 7.4% in March to 6.1%.  TSH was normal.  Fasting glucose was 110.  Creatinine was 1.16.  She is concerned because mail-order pharmacy may not be able to get Tranxene.  I think she should check with local pharmacy to see if she can get Tranxene that way.  It is generic and should not be all that expensive.  She is not clear why she received that letter about a shortage of Tranxene.  I had not heard this previously from other sources.  She has been on it a long time and I am hesitant to change it.  Has not been sleeping all that well and one night did not get sleep before 5 AM.  Main complaint is fatigue, anxiety and depression in addition to musculoskeletal pain/leg weakness    ROS see above      Objective:   T 98.1 Weight 175 pounds T 98.1 Ht 65.25 inches-has only lost 1 pound  since July  Physical Exam skin warm and dry.  Nodes none.  No thyromegaly.  Chest clear.  Cardiac exam: Regular rate and rhythm without ectopy.  Straight leg raising on the right leg is negative.  Muscle strength is normal in the lower extremities.  No pitting edema.  Affect is a bit flat and she looks fatigued.      Assessment & Plan:  Fatigue-etiology unclear  Situational stress  Type 2 diabetes mellitus  History of COVID-19-resolved  History of mild chronic kidney disease with sometimes slight elevation of creatinine.  Medications include Wellbutrin XL 150 mg daily, Januvia, simvastatin, metformin, Boniva, Levsin if needed for irritable bowel, Zolof, meloxicam.  Labs drawn today include rheumatoid factor, CCP, ANA, TSH, free T4, hemoglobin A1c, iron and iron binding capacity, B12 and folate  Margaree Mackintosh, MD

## 2021-08-26 ENCOUNTER — Telehealth: Payer: Self-pay | Admitting: Internal Medicine

## 2021-08-26 ENCOUNTER — Encounter: Payer: Self-pay | Admitting: Internal Medicine

## 2021-08-26 MED ORDER — CLORAZEPATE DIPOTASSIUM 3.75 MG PO TABS: ORAL_TABLET | ORAL | 3 refills | Status: DC

## 2021-08-26 NOTE — Telephone Encounter (Signed)
Faxed referral

## 2021-08-26 NOTE — Telephone Encounter (Signed)
Diana George [phone]  Shanti called to get an appointment with Diana George and she needs a referral.

## 2021-08-26 NOTE — Telephone Encounter (Signed)
Diana George has called in today to say that [facility] at [place] says that the can get below medication.  clorazepate (TRANXENE) 3.75 MG tablet

## 2021-08-26 NOTE — Addendum Note (Signed)
Addended by: Jama Flavors on: 08/26/2021 02:00 PM   Modules accepted: Orders

## 2021-08-28 LAB — CBC WITH DIFFERENTIAL/PLATELET
Absolute Monocytes: 622 cells/uL (ref 200–950)
Basophils Absolute: 59 cells/uL (ref 0–200)
Basophils Relative: 0.7 %
Eosinophils Absolute: 202 cells/uL (ref 15–500)
Eosinophils Relative: 2.4 %
HCT: 41.1 % (ref 35.0–45.0)
Hemoglobin: 13.6 g/dL (ref 11.7–15.5)
Lymphs Abs: 2302 cells/uL (ref 850–3900)
MCH: 31.3 pg (ref 27.0–33.0)
MCHC: 33.1 g/dL (ref 32.0–36.0)
MCV: 94.7 fL (ref 80.0–100.0)
MPV: 11.3 fL (ref 7.5–12.5)
Monocytes Relative: 7.4 %
Neutro Abs: 5216 cells/uL (ref 1500–7800)
Neutrophils Relative %: 62.1 %
Platelets: 367 10*3/uL (ref 140–400)
RBC: 4.34 10*6/uL (ref 3.80–5.10)
RDW: 12 % (ref 11.0–15.0)
Total Lymphocyte: 27.4 %
WBC: 8.4 10*3/uL (ref 3.8–10.8)

## 2021-08-28 LAB — TSH: TSH: 2.14 mIU/L (ref 0.40–4.50)

## 2021-08-28 LAB — COMPLETE METABOLIC PANEL WITH GFR
AG Ratio: 1.8 (calc) (ref 1.0–2.5)
ALT: 17 U/L (ref 6–29)
AST: 17 U/L (ref 10–35)
Albumin: 4.3 g/dL (ref 3.6–5.1)
Alkaline phosphatase (APISO): 56 U/L (ref 37–153)
BUN/Creatinine Ratio: 14 (calc) (ref 6–22)
BUN: 15 mg/dL (ref 7–25)
CO2: 25 mmol/L (ref 20–32)
Calcium: 9.6 mg/dL (ref 8.6–10.4)
Chloride: 107 mmol/L (ref 98–110)
Creat: 1.05 mg/dL — ABNORMAL HIGH (ref 0.60–1.00)
Globulin: 2.4 g/dL (calc) (ref 1.9–3.7)
Glucose, Bld: 116 mg/dL — ABNORMAL HIGH (ref 65–99)
Potassium: 4.3 mmol/L (ref 3.5–5.3)
Sodium: 142 mmol/L (ref 135–146)
Total Bilirubin: 0.6 mg/dL (ref 0.2–1.2)
Total Protein: 6.7 g/dL (ref 6.1–8.1)
eGFR: 56 mL/min/{1.73_m2} — ABNORMAL LOW (ref >=60)

## 2021-08-28 LAB — HEMOGLOBIN A1C
Hgb A1c MFr Bld: 6 % of total Hgb — ABNORMAL HIGH (ref <5.7)
Mean Plasma Glucose: 126 mg/dL
eAG (mmol/L): 7 mmol/L

## 2021-08-28 LAB — IRON,TIBC AND FERRITIN PANEL
%SAT: 34 % (calc) (ref 16–45)
Ferritin: 43 ng/mL (ref 16–288)
Iron: 111 ug/dL (ref 45–160)
TIBC: 331 mcg/dL (calc) (ref 250–450)

## 2021-08-28 LAB — B12 AND FOLATE PANEL
Folate: 12.2 ng/mL
Vitamin B-12: 275 pg/mL (ref 200–1100)

## 2021-08-28 LAB — ANA: Anti Nuclear Antibody (ANA): NEGATIVE

## 2021-08-28 LAB — CYCLIC CITRUL PEPTIDE ANTIBODY, IGG: Cyclic Citrullin Peptide Ab: 25 UNITS — ABNORMAL HIGH

## 2021-08-28 LAB — RHEUMATOID FACTOR: Rheumatoid fact SerPl-aCnc: <14 IU/mL (ref <14)

## 2021-08-28 LAB — T4, FREE: Free T4: 1.1 ng/dL (ref 0.8–1.8)

## 2021-09-09 DIAGNOSIS — F4320 Adjustment disorder, unspecified: Secondary | ICD-10-CM | POA: Diagnosis not present

## 2021-09-09 DIAGNOSIS — F331 Major depressive disorder, recurrent, moderate: Secondary | ICD-10-CM | POA: Diagnosis not present

## 2021-09-09 LAB — HM DIABETES EYE EXAM

## 2021-09-13 ENCOUNTER — Telehealth: Payer: Self-pay | Admitting: Internal Medicine

## 2021-09-13 NOTE — Telephone Encounter (Signed)
Faxed most recent labs.

## 2021-09-16 DIAGNOSIS — G4733 Obstructive sleep apnea (adult) (pediatric): Secondary | ICD-10-CM | POA: Diagnosis not present

## 2021-09-20 DIAGNOSIS — H524 Presbyopia: Secondary | ICD-10-CM | POA: Diagnosis not present

## 2021-09-26 ENCOUNTER — Other Ambulatory Visit: Payer: Self-pay

## 2021-09-26 MED ORDER — SITAGLIPTIN PHOSPHATE 100 MG PO TABS: 100.0000 mg | ORAL_TABLET | Freq: Every day | ORAL | 0 refills | Status: DC

## 2021-09-26 MED ORDER — METFORMIN HCL 500 MG PO TABS: ORAL_TABLET | ORAL | 0 refills | Status: DC

## 2021-09-29 ENCOUNTER — Other Ambulatory Visit: Payer: Self-pay

## 2021-09-29 ENCOUNTER — Other Ambulatory Visit: Payer: Medicare Other | Admitting: Internal Medicine

## 2021-09-29 DIAGNOSIS — E538 Deficiency of other specified B group vitamins: Secondary | ICD-10-CM

## 2021-09-29 NOTE — Progress Notes (Signed)
Lab only 

## 2021-09-30 ENCOUNTER — Ambulatory Visit (INDEPENDENT_AMBULATORY_CARE_PROVIDER_SITE_OTHER): Payer: Medicare Other | Admitting: Internal Medicine

## 2021-09-30 ENCOUNTER — Encounter: Payer: Self-pay | Admitting: Internal Medicine

## 2021-09-30 VITALS — BP 118/72 | HR 91 | Temp 97.8°F | Resp 16 | Ht 66.0 in | Wt 189.0 lb

## 2021-09-30 DIAGNOSIS — R768 Other specified abnormal immunological findings in serum: Secondary | ICD-10-CM

## 2021-09-30 DIAGNOSIS — F439 Reaction to severe stress, unspecified: Secondary | ICD-10-CM

## 2021-09-30 DIAGNOSIS — E538 Deficiency of other specified B group vitamins: Secondary | ICD-10-CM | POA: Diagnosis not present

## 2021-09-30 DIAGNOSIS — R2681 Unsteadiness on feet: Secondary | ICD-10-CM | POA: Diagnosis not present

## 2021-09-30 LAB — VITAMIN B12: Vitamin B-12: 336 pg/mL (ref 200–1100)

## 2021-09-30 MED ORDER — CYANOCOBALAMIN 1000 MCG/ML IJ SOLN
1000.0000 ug | Freq: Once | INTRAMUSCULAR | Status: AC
  Administered 2021-09-30: 1000 ug via INTRAMUSCULAR

## 2021-09-30 NOTE — Patient Instructions (Signed)
1 cc of vitamin B12 IM given today.  Will receive another dose in 4 weeks with nurse visit and I will see her again in 8 weeks for B12 level and office visit.  Neurology consultation for fine hand tremor and feeling off balance.  Check on Rheumatology referral which was placed previously for positive CCP (low titer)  Continue counseling with Ellis Savage.  Wellbutrin was increased from 150 XL to 300 XL by Ms. Poulos.

## 2021-09-30 NOTE — Progress Notes (Signed)
   Subjective:    Patient ID: Diana George, female    DOB: [birth year], 75 y.o.   MRN: [id number]  HPI here to follow-up on mild B12 deficiency and other medical concerns.  Has not heard from Rheumatologist about referral.  History of positive CCP- weakly positive at 25 with complaint of musculoskeletal pain.  Rheumatoid factor was negative.  ANA and TSH were normal.  Free T4 normal.  These labs were done in October.  We placed her on oral vitamin B12 in October as her level was 275 when checked a month ago.  Normal B12 value being between 200 and 1100.  Level is now 336.  Says she has been taking oral supplement.  I gave her an injection of B12 today 1 cc IM and will follow-up in a month with another injection and then an office visit in 2 months with recheck of level.  We will check on rheumatology consultation.  She is also having some balance issues.  Feels that her gait is unsteady.  Will order CT of the head and place neurology consultation.  She has been going to see Ellis Savage for counseling regarding situational stress.  He thinks that is helped.  She is on Zoloft 100 mg daily, Wellbutrin, Tranxene.  Wellbutrin was increased from 150 XL to 300 XL by Misty Stanley fluids.  This was in November.  I do not think this is causing unsteadiness with her gait.   Review of Systems see above     Objective:   Physical Exam She does have a mild head tremor.  Her speech is normal. Blood pressure 118/72 pulse 91 respiratory rate 16 temperature 97.8 degrees pulse oximetry 98% weight 189 pounds height 5 feet 6 inches BMI 30.51      Assessment & Plan:   I think it has been beneficial for her to see a counselor.  Wellbutrin has been increased from 150 XL to 300 mg XL by Ellis Savage.  B12 injection 1 cc IM given today.  She will get another injection in 4 weeks without office visit and see me in 8 weeks for B12 level and office visit.  Check of rheumatology consultation for positive CCP and musculoskeletal  pain.  Neurology referral for head tremor and feeling off balance.  Explained to patient that she has had a very thorough evaluation through this office and that she should continue with her counseling with Ellis Savage.  We will try to rule out other major medical conditions such as rheumatoid arthritis or early Parkinsonism with Neurology consultation.

## 2021-10-14 DIAGNOSIS — F331 Major depressive disorder, recurrent, moderate: Secondary | ICD-10-CM | POA: Diagnosis not present

## 2021-10-30 ENCOUNTER — Telehealth: Payer: Medicare Other | Admitting: Physician Assistant

## 2021-10-30 DIAGNOSIS — E538 Deficiency of other specified B group vitamins: Secondary | ICD-10-CM

## 2021-10-30 DIAGNOSIS — W19XXXA Unspecified fall, initial encounter: Secondary | ICD-10-CM

## 2021-10-30 DIAGNOSIS — R251 Tremor, unspecified: Secondary | ICD-10-CM | POA: Diagnosis not present

## 2021-10-30 NOTE — Patient Instructions (Signed)
Ihor Gully, thank you for joining Margaretann Loveless, PA-C for today's virtual visit.  While this provider is not your primary care provider (PCP), if your PCP is located in our provider database this encounter information will be shared with them immediately following your visit.  Consent: (Patient) Diana George provided verbal consent for this virtual visit at the beginning of the encounter.  Current Medications:  Current Outpatient Medications:    buPROPion (WELLBUTRIN XL) 150 MG 24 hr tablet, TAKE 1 TABLET BY MOUTH DAILY, GENERIC EQUIVALENT FOR WELLBUTRIN XL (Patient taking differently: daily. TAKE 2 TABLET BY MOUTH DAILY, GENERIC EQUIVALENT FOR WELLBUTRIN XL), Disp: 90 tablet, Rfl: 3   Cholecalciferol (VITAMIN D) 2000 UNITS CAPS, Take 1 capsule by mouth 2 (two) times daily., Disp: , Rfl:    clorazepate (TRANXENE) 3.75 MG tablet, TAKE 1 TABLET po TWICE DAILY prn ANXIETY. GENERIC EQUIVALENT FOR TRANXENE., Disp: 180 tablet, Rfl: 3   Hyoscyamine Sulfate SL (LEVSIN/SL) 0.125 MG SUBL, Place 0.125 tablets under the tongue as needed., Disp: 60 tablet, Rfl: 1   ibandronate (BONIVA) 150 MG tablet, Take 1 tablet by mouth every 30 (thirty) days., Disp: , Rfl:    meloxicam (MOBIC) 15 MG tablet, Take 1 tablet (15 mg total) by mouth daily., Disp: 30 tablet, Rfl: 1   metFORMIN (GLUCOPHAGE) 500 MG tablet, TAKE 1 TABLET BY MOUTH TWICE DAILY. GENERIC EQUIVALENT FOR GLUCOPHAGE., Disp: 180 tablet, Rfl: 0   sertraline (ZOLOFT) 100 MG tablet, TAKE 1 TABLET BY MOUTH DAILY GENERIC EQUIVALENT FOR ZOLOFT, Disp: 90 tablet, Rfl: 0   simvastatin (ZOCOR) 20 MG tablet, TAKE 1 TABLET BY MOUTH DAILY GENERIC EQUIVALENT FOR ZOCOR, Disp: 90 tablet, Rfl: 1   sitaGLIPtin (JANUVIA) 100 MG tablet, Take 1 tablet (100 mg total) by mouth daily., Disp: 90 tablet, Rfl: 0   Medications ordered in this encounter:  No orders of the defined types were placed in this encounter.    *If you need refills on other medications prior  to your next appointment, please contact your pharmacy*  Follow-Up: Call back or seek an in-person evaluation if the symptoms worsen or if the condition fails to improve as anticipated.  Other Instructions Fall Prevention in the Home, Adult Falls can cause injuries and can happen to people of all ages. There are many things you can do to make your home safe and to help prevent falls. Ask for help when making these changes. What actions can I take to prevent falls? General Instructions Use good lighting in all rooms. Replace any light bulbs that burn out. Turn on the lights in dark areas. Use night-lights. Keep items that you use often in easy-to-reach places. Lower the shelves around your home if needed. Set up your furniture so you have a clear path. Avoid moving your furniture around. Do not have throw rugs or other things on the floor that can make you trip. Avoid walking on wet floors. If any of your floors are uneven, fix them. Add color or contrast paint or tape to clearly mark and help you see: Grab bars or handrails. First and last steps of staircases. Where the edge of each step is. If you use a stepladder: Make sure that it is fully opened. Do not climb a closed stepladder. Make sure the sides of the stepladder are locked in place. Ask someone to hold the stepladder while you use it. Know where your pets are when moving through your home. What can I do in the bathroom?  Keep the floor dry. Clean up any water on the floor right away. Remove soap buildup in the tub or shower. Use nonskid mats or decals on the floor of the tub or shower. Attach bath mats securely with double-sided, nonslip rug tape. If you need to sit down in the shower, use a plastic, nonslip stool. Install grab bars by the toilet and in the tub and shower. Do not use towel bars as grab bars. What can I do in the bedroom? Make sure that you have a light by your bed that is easy to reach. Do not use any  sheets or blankets for your bed that hang to the floor. Have a firm chair with side arms that you can use for support when you get dressed. What can I do in the kitchen? Clean up any spills right away. If you need to reach something above you, use a step stool with a grab bar. Keep electrical cords out of the way. Do not use floor polish or wax that makes floors slippery. What can I do with my stairs? Do not leave any items on the stairs. Make sure that you have a light switch at the top and the bottom of the stairs. Make sure that there are handrails on both sides of the stairs. Fix handrails that are broken or loose. Install nonslip stair treads on all your stairs. Avoid having throw rugs at the top or bottom of the stairs. Choose a carpet that does not hide the edge of the steps on the stairs. Check carpeting to make sure that it is firmly attached to the stairs. Fix carpet that is loose or worn. What can I do on the outside of my home? Use bright outdoor lighting. Fix the edges of walkways and driveways and fix any cracks. Remove anything that might make you trip as you walk through a door, such as a raised step or threshold. Trim any bushes or trees on paths to your home. Check to see if handrails are loose or broken and that both sides of all steps have handrails. Install guardrails along the edges of any raised decks and porches. Clear paths of anything that can make you trip, such as tools or rocks. Have leaves, snow, or ice cleared regularly. Use sand or salt on paths during winter. Clean up any spills in your garage right away. This includes grease or oil spills. What other actions can I take? Wear shoes that: Have a low heel. Do not wear high heels. Have rubber bottoms. Feel good on your feet and fit well. Are closed at the toe. Do not wear open-toe sandals. Use tools that help you move around if needed. These include: Canes. Walkers. Scooters. Crutches. Review your  medicines with your doctor. Some medicines can make you feel dizzy. This can increase your chance of falling. Ask your doctor what else you can do to help prevent falls. Where to find more information Centers for Disease Control and Prevention, [employer]: FootballExhibition.com.brwww.cdc.gov [employer] A[employer]: https://walker.com/www.nia.nih.gov Contact a doctor if: You are afraid of falling at home. You feel weak, drowsy, or dizzy at home. You fall at home. Summary There are many simple things that you can do to make your home safe and to help prevent falls. Ways to make your home safe include removing things that can make you trip and installing grab bars in the bathroom. Ask for help when making these changes in your home. This information is not intended to replace advice  given to you by your health care provider. Make sure you discuss any questions you have with your health care provider. Document Revised: 05/19/2020 Document Reviewed: 05/19/2020 Elsevier Patient Education  2022 [employer].    If you have been instructed to have an in-person evaluation today at a local Urgent Care facility, please use the link below. It will take you to a list of all of our available [facility], including address, phone number and hours of operation. Please do not delay care.  [facility]  If you or a family member do not have a primary care provider, use the link below to schedule a visit and establish care. When you choose a [facility] primary care physician or advanced practice provider, you gain a long-term partner in health. Find a Primary Care Provider  Learn more about [facility]'s in-office and virtual care options: [facility] - Get Care Now

## 2021-10-30 NOTE — Progress Notes (Signed)
Virtual Visit Consent   Diana George, you are scheduled for a virtual visit with a [facility] provider today.     Just as with appointments in the office, your consent must be obtained to participate.  Your consent will be active for this visit and any virtual visit you may have with one of our providers in the next 365 days.     If you have a MyChart account, a copy of this consent can be sent to you electronically.  All virtual visits are billed to your insurance company just like a traditional visit in the office.    As this is a virtual visit, video technology does not allow for your provider to perform a traditional examination.  This may limit your provider's ability to fully assess your condition.  If your provider identifies any concerns that need to be evaluated in person or the need to arrange testing (such as labs, EKG, etc.), we will make arrangements to do so.     Although advances in technology are sophisticated, we cannot ensure that it will always work on either your end or our end.  If the connection with a video visit is poor, the visit may have to be switched to a telephone visit.  With either a video or telephone visit, we are not always able to ensure that we have a secure connection.     I need to obtain your verbal consent now.   Are you willing to proceed with your visit today?    HALYN VAMOS has provided verbal consent on 10/30/2021 for a virtual visit (video or telephone).   Mar Daring, PA-C   Date: 10/30/2021 2:10 PM   Virtual Visit via Video Note   I, Mar Daring, connected with  Diana George  ([id number], Oct 01, 1946) on 10/30/21 at  1:15 PM EST by a video-enabled telemedicine application and verified that I am speaking with the correct person using two identifiers.  Location: Patient: Virtual Visit Location Patient: Home Provider: Virtual Visit Location Provider: Home Office   I discussed the limitations of evaluation and management by  telemedicine and the availability of in person appointments. The patient expressed understanding and agreed to proceed.    History of Present Illness: Diana George is a 76 y.o. who identifies as a female who was assigned female at birth, and is being seen today for fall. Fell yesterday, reports has a high bed with stairs to get in and out. Was getting out of bed and fell face first in the floor. Hit head on the bathroom door that was open. Having more soreness today. No LOC, does have a mild headache and tenderness to palpation, no deformity palpated. Does also mention increased sensation of restlessness in her legs and increased hand tremor. Has known B12 def that was recently diagnosed. Had trial oral B12 did not elevate levels enough. On 09/29/21 had 1st b12 injection. Symptoms of restlessness, eye symptoms, brain fog improved mildly after injection. Scheduled to have next injection on 11/02/21. Curious if her symptoms are coming from her B12. Concerned for hand tremor. Reports hand tremor just started yesterday, but reports her father was diagnosed with benign essential tremor.   Problems:  Patient Active Problem List   Diagnosis Date Noted   OSA (obstructive sleep apnea) 04/13/2014   Allergic rhinitis 09/29/2012   Situational stress 09/29/2012   Hyperlipidemia 06/16/2011   Diabetes mellitus ([facility]) 06/16/2011   Anxiety and depression 06/16/2011    Allergies:  Allergies  Allergen Reactions   Amoxicillin Rash   Medications:  Current Outpatient Medications:    buPROPion (WELLBUTRIN XL) 150 MG 24 hr tablet, TAKE 1 TABLET BY MOUTH DAILY, GENERIC EQUIVALENT FOR WELLBUTRIN XL (Patient taking differently: daily. TAKE 2 TABLET BY MOUTH DAILY, GENERIC EQUIVALENT FOR WELLBUTRIN XL), Disp: 90 tablet, Rfl: 3   Cholecalciferol (VITAMIN D) 2000 UNITS CAPS, Take 1 capsule by mouth 2 (two) times daily., Disp: , Rfl:    clorazepate (TRANXENE) 3.75 MG tablet, TAKE 1 TABLET po TWICE DAILY prn ANXIETY. GENERIC  EQUIVALENT FOR TRANXENE., Disp: 180 tablet, Rfl: 3   Hyoscyamine Sulfate SL (LEVSIN/SL) 0.125 MG SUBL, Place 0.125 tablets under the tongue as needed., Disp: 60 tablet, Rfl: 1   ibandronate (BONIVA) 150 MG tablet, Take 1 tablet by mouth every 30 (thirty) days., Disp: , Rfl:    meloxicam (MOBIC) 15 MG tablet, Take 1 tablet (15 mg total) by mouth daily., Disp: 30 tablet, Rfl: 1   metFORMIN (GLUCOPHAGE) 500 MG tablet, TAKE 1 TABLET BY MOUTH TWICE DAILY. GENERIC EQUIVALENT FOR GLUCOPHAGE., Disp: 180 tablet, Rfl: 0   sertraline (ZOLOFT) 100 MG tablet, TAKE 1 TABLET BY MOUTH DAILY GENERIC EQUIVALENT FOR ZOLOFT, Disp: 90 tablet, Rfl: 0   simvastatin (ZOCOR) 20 MG tablet, TAKE 1 TABLET BY MOUTH DAILY GENERIC EQUIVALENT FOR ZOCOR, Disp: 90 tablet, Rfl: 1   sitaGLIPtin (JANUVIA) 100 MG tablet, Take 1 tablet (100 mg total) by mouth daily., Disp: 90 tablet, Rfl: 0  Observations/Objective: Patient is well-developed, well-nourished in no acute distress.  Resting comfortably  at home.  Head is normocephalic, atraumatic.  No labored breathing.  Speech is clear and coherent with logical content.  Patient is alert and oriented at baseline.  No obvious head or face deformity or bruising noted Hand tremor noted bilaterally  Assessment and Plan: 1. Fall, initial encounter  2. B12 deficiency  3. Tremor of both hands  - Suspect symptoms returning following decrease in B12 - Advised to keep scheduled appt for B12 injection on 11/02/21 and to also call PCP to inform of this occurrence as they may prefer to see her in person instead of just the nurse visit - May require B12 injections slightly more frequently until levels adequate to spread injections out further - Also advised to keep scheduled Neurology visit scheduled for 12/12/21 for further work up of tremor, etc  Follow Up Instructions: I discussed the assessment and treatment plan with the patient. The patient was provided an opportunity to ask questions  and all were answered. The patient agreed with the plan and demonstrated an understanding of the instructions.  A copy of instructions were sent to the patient via MyChart unless otherwise noted below.   The patient was advised to call back or seek an in-person evaluation if the symptoms worsen or if the condition fails to improve as anticipated.  Time:  I spent 16 minutes with the patient via telehealth technology discussing the above problems/concerns.    Mar Daring, PA-C

## 2021-11-01 ENCOUNTER — Other Ambulatory Visit: Payer: Self-pay

## 2021-11-01 MED ORDER — SITAGLIPTIN PHOSPHATE 100 MG PO TABS: 100.0000 mg | ORAL_TABLET | Freq: Every day | ORAL | 0 refills | Status: DC

## 2021-11-02 ENCOUNTER — Ambulatory Visit (INDEPENDENT_AMBULATORY_CARE_PROVIDER_SITE_OTHER): Payer: Medicare Other | Admitting: Internal Medicine

## 2021-11-02 ENCOUNTER — Other Ambulatory Visit: Payer: Self-pay

## 2021-11-02 VITALS — BP 118/72 | HR 94 | Temp 97.9°F | Wt 170.0 lb

## 2021-11-02 DIAGNOSIS — E538 Deficiency of other specified B group vitamins: Secondary | ICD-10-CM | POA: Diagnosis not present

## 2021-11-02 DIAGNOSIS — S0990XD Unspecified injury of head, subsequent encounter: Secondary | ICD-10-CM

## 2021-11-02 DIAGNOSIS — R251 Tremor, unspecified: Secondary | ICD-10-CM | POA: Diagnosis not present

## 2021-11-02 LAB — COMPLETE METABOLIC PANEL WITH GFR
AG Ratio: 2 (calc) (ref 1.0–2.5)
ALT: 31 U/L — ABNORMAL HIGH (ref 6–29)
AST: 26 U/L (ref 10–35)
Albumin: 4.7 g/dL (ref 3.6–5.1)
Alkaline phosphatase (APISO): 64 U/L (ref 37–153)
BUN/Creatinine Ratio: 19 (calc) (ref 6–22)
BUN: 28 mg/dL — ABNORMAL HIGH (ref 7–25)
CO2: 26 mmol/L (ref 20–32)
Calcium: 10.5 mg/dL — ABNORMAL HIGH (ref 8.6–10.4)
Chloride: 105 mmol/L (ref 98–110)
Creat: 1.44 mg/dL — ABNORMAL HIGH (ref 0.60–1.00)
Globulin: 2.4 g/dL (calc) (ref 1.9–3.7)
Glucose, Bld: 100 mg/dL — ABNORMAL HIGH (ref 65–99)
Potassium: 4.7 mmol/L (ref 3.5–5.3)
Sodium: 140 mmol/L (ref 135–146)
Total Bilirubin: 0.6 mg/dL (ref 0.2–1.2)
Total Protein: 7.1 g/dL (ref 6.1–8.1)
eGFR: 38 mL/min/{1.73_m2} — ABNORMAL LOW (ref >=60)

## 2021-11-02 LAB — CBC WITH DIFFERENTIAL/PLATELET
Absolute Monocytes: 714 cells/uL (ref 200–950)
Basophils Absolute: 56 cells/uL (ref 0–200)
Basophils Relative: 0.6 %
Eosinophils Absolute: 291 cells/uL (ref 15–500)
Eosinophils Relative: 3.1 %
HCT: 41 % (ref 35.0–45.0)
Hemoglobin: 13.5 g/dL (ref 11.7–15.5)
Lymphs Abs: 2303 cells/uL (ref 850–3900)
MCH: 31.3 pg (ref 27.0–33.0)
MCHC: 32.9 g/dL (ref 32.0–36.0)
MCV: 94.9 fL (ref 80.0–100.0)
MPV: 10.8 fL (ref 7.5–12.5)
Monocytes Relative: 7.6 %
Neutro Abs: 6035 cells/uL (ref 1500–7800)
Neutrophils Relative %: 64.2 %
Platelets: 371 10*3/uL (ref 140–400)
RBC: 4.32 10*6/uL (ref 3.80–5.10)
RDW: 12 % (ref 11.0–15.0)
Total Lymphocyte: 24.5 %
WBC: 9.4 10*3/uL (ref 3.8–10.8)

## 2021-11-02 MED ORDER — CYANOCOBALAMIN 1000 MCG/ML IJ SOLN
1000.0000 ug | Freq: Once | INTRAMUSCULAR | Status: AC
  Administered 2021-11-02: 1000 ug via INTRAMUSCULAR

## 2021-11-02 NOTE — Progress Notes (Signed)
Scheduled office visit 

## 2021-11-02 NOTE — Patient Instructions (Signed)
Has appt with Dr. Rexene Alberts to to evaluate balance issues and hand tremor. Has appt with Rheumatologist in March. Has seen Noemi Chapel for counseling twice.

## 2021-11-02 NOTE — Progress Notes (Signed)
° °  Subjective:    Patient ID: Diana George, female    DOB: 17-Dec-1945, 76 y.o.   MRN: [id number]  HPI  Patient had a fall at home on New Years Eve after midnight. Patient tried to get out of bed to go to the bathroom and fell forward striking her forehead. Had no LOC. Has a high bed to climb down from and lost her balance.Prior to this, she had developed bilateral hand tremors and will be seeing Neurologist soon. Has  mild B12 deficiency ( low normal level) and is now  getting B12 injections monthly- which were recently started.  History of catheter ablation for recurrent SVT by Dr. Lovena Le in 2005.  History of chronic kidney disease which is mild.  Had syncope in 2002 and echocardiogram showed mild prolapse of the anterior leaflet of mitral valve.  She had mild mitral regurgitation.  History of stone basket extraction for kidney stones in the mid 1970s.  Fractured left wrist November 2007 due to a fall in addition to right anterior cruciate ligament tear anterior and posterior horn of medial meniscus.  Bilateral tubal ligation 1980.  She has a history of irritable bowel syndrome, musculoskeletal pain which has been treated with meloxicam, hyperlipidemia treated with simvastatin, type 2 diabetes treated with Januvia and metformin, anxiety and depression treated with Tranxene, Zoloft and Wellbutrin.  History of mild chronic kidney disease stage IIIa stable.  Hyperlipidemia treated with Zocor.  History of osteopenia with T score -2.4 in the left femoral neck, right femoral neck and right forearm.  She was started on Boniva by GYN physician.   After her fall at home, she went to church on New Years Day. Had virtual visit with PA on January 1. Has not felt well since the fall. She drove here. She looks fatigued. She is  alert and oriented but moving slowly.  Seeing Noemi Chapel for counseling and medication management with situational stress and depression.  Review of Systems no headache nausea or  vomiting. No visual disturbance     Objective:   Physical Exam BP 118/72, pulse 94, T 97.9 degrees pulse ox 98% ,weight 170 pounds, BMI 27.44  Skin warm and dry. Bilateral hand tremors. Neck supple , alert and oriented x 3. PERLA. EOMs full. No  facial weakness or dysarthria.Muscle strength in UEs and LEs normal. DTRs in knees symmetrical. No palpable skull deformities.         Assessment & Plan:  Head trauma with Fall at home.  Plan: Order CT of brain ASAP to rule out CNS bleed or occult skull fracture.  Bilateral hand tremors- referral to Neurology pending.Was seen here in early December and noted to have bilateral hand tremors.  Anxiety and depression- has seen Noemi Chapel twice recently. Currently on Tranxene, Wellbutrin, and Zoloft  Type 2 diabetes mellitus- not prone to hypoglycemia- takes oral agents  Hyperlipidemia- treated with statin  Osteopenia treated with Boniva by GYN  CBC and C-met ordered. Glucose  is 100.   BUN 1.44 which is higher than normal but she may not be well hydrated. Repeat after hydration in a couple of days. Electrolytes and Liver functions are normal as are serum proteins.History of mild chronic kidney disease.  Low normal B12 level. 1cc IM B12 given in office today.  Time spent with patient including record review, lab review and medical decision making is 45 minutes.

## 2021-11-03 ENCOUNTER — Ambulatory Visit (HOSPITAL_BASED_OUTPATIENT_CLINIC_OR_DEPARTMENT_OTHER)
Admission: RE | Admit: 2021-11-03 | Discharge: 2021-11-03 | Disposition: A | Discharge status: Home / Self Care | Payer: Medicare Other | Source: Ambulatory Visit | Attending: Internal Medicine | Admitting: Internal Medicine

## 2021-11-03 DIAGNOSIS — R251 Tremor, unspecified: Secondary | ICD-10-CM | POA: Diagnosis not present

## 2021-11-03 DIAGNOSIS — S0990XD Unspecified injury of head, subsequent encounter: Secondary | ICD-10-CM | POA: Insufficient documentation

## 2021-11-03 DIAGNOSIS — G319 Degenerative disease of nervous system, unspecified: Secondary | ICD-10-CM | POA: Diagnosis not present

## 2021-11-03 DIAGNOSIS — S0990XA Unspecified injury of head, initial encounter: Secondary | ICD-10-CM | POA: Diagnosis not present

## 2021-11-04 ENCOUNTER — Other Ambulatory Visit: Payer: Self-pay

## 2021-11-04 ENCOUNTER — Other Ambulatory Visit: Payer: Medicare Other | Admitting: Internal Medicine

## 2021-11-04 DIAGNOSIS — R899 Unspecified abnormal finding in specimens from other organs, systems and tissues: Secondary | ICD-10-CM | POA: Diagnosis not present

## 2021-11-05 LAB — COMPLETE METABOLIC PANEL WITH GFR
AG Ratio: 1.8 (calc) (ref 1.0–2.5)
ALT: 29 U/L (ref 6–29)
AST: 25 U/L (ref 10–35)
Albumin: 4.4 g/dL (ref 3.6–5.1)
Alkaline phosphatase (APISO): 64 U/L (ref 37–153)
BUN/Creatinine Ratio: 21 (calc) (ref 6–22)
BUN: 27 mg/dL — ABNORMAL HIGH (ref 7–25)
CO2: 23 mmol/L (ref 20–32)
Calcium: 9.7 mg/dL (ref 8.6–10.4)
Chloride: 106 mmol/L (ref 98–110)
Creat: 1.28 mg/dL — ABNORMAL HIGH (ref 0.60–1.00)
Globulin: 2.4 g/dL (calc) (ref 1.9–3.7)
Glucose, Bld: 140 mg/dL — ABNORMAL HIGH (ref 65–139)
Potassium: 4.6 mmol/L (ref 3.5–5.3)
Sodium: 141 mmol/L (ref 135–146)
Total Bilirubin: 0.5 mg/dL (ref 0.2–1.2)
Total Protein: 6.8 g/dL (ref 6.1–8.1)
eGFR: 44 mL/min/{1.73_m2} — ABNORMAL LOW (ref >=60)

## 2021-11-07 ENCOUNTER — Encounter: Payer: Self-pay | Admitting: Internal Medicine

## 2021-11-07 NOTE — Telephone Encounter (Signed)
Appt made. Patient is to come in fasting but very hydrated. Scheduled this week.

## 2021-11-09 ENCOUNTER — Ambulatory Visit: Payer: Medicare Other

## 2021-11-10 ENCOUNTER — Other Ambulatory Visit: Payer: Medicare Other | Admitting: Internal Medicine

## 2021-11-10 DIAGNOSIS — S0990XD Unspecified injury of head, subsequent encounter: Secondary | ICD-10-CM | POA: Diagnosis not present

## 2021-11-10 DIAGNOSIS — R251 Tremor, unspecified: Secondary | ICD-10-CM | POA: Diagnosis not present

## 2021-11-10 LAB — CALCIUM: Calcium: 9.6 mg/dL (ref 8.6–10.4)

## 2021-11-10 LAB — CREATININE, SERUM: Creat: 1.14 mg/dL — ABNORMAL HIGH (ref 0.60–1.00)

## 2021-11-10 LAB — BUN: BUN: 23 mg/dL (ref 7–25)

## 2021-11-11 ENCOUNTER — Ambulatory Visit (INDEPENDENT_AMBULATORY_CARE_PROVIDER_SITE_OTHER): Payer: Medicare Other | Admitting: Internal Medicine

## 2021-11-11 ENCOUNTER — Telehealth: Payer: Self-pay

## 2021-11-11 ENCOUNTER — Other Ambulatory Visit: Payer: Self-pay

## 2021-11-11 ENCOUNTER — Encounter: Payer: Self-pay | Admitting: Internal Medicine

## 2021-11-11 VITALS — BP 132/82 | HR 80 | Temp 98.1°F

## 2021-11-11 DIAGNOSIS — E538 Deficiency of other specified B group vitamins: Secondary | ICD-10-CM

## 2021-11-11 DIAGNOSIS — R251 Tremor, unspecified: Secondary | ICD-10-CM | POA: Diagnosis not present

## 2021-11-11 DIAGNOSIS — F32A Depression, unspecified: Secondary | ICD-10-CM

## 2021-11-11 DIAGNOSIS — G4733 Obstructive sleep apnea (adult) (pediatric): Secondary | ICD-10-CM

## 2021-11-11 DIAGNOSIS — R768 Other specified abnormal immunological findings in serum: Secondary | ICD-10-CM

## 2021-11-11 DIAGNOSIS — E782 Mixed hyperlipidemia: Secondary | ICD-10-CM

## 2021-11-11 DIAGNOSIS — E785 Hyperlipidemia, unspecified: Secondary | ICD-10-CM

## 2021-11-11 DIAGNOSIS — E1169 Type 2 diabetes mellitus with other specified complication: Secondary | ICD-10-CM

## 2021-11-11 DIAGNOSIS — E119 Type 2 diabetes mellitus without complications: Secondary | ICD-10-CM

## 2021-11-11 DIAGNOSIS — F419 Anxiety disorder, unspecified: Secondary | ICD-10-CM

## 2021-11-11 NOTE — Progress Notes (Signed)
Subjective:    Patient ID: Diana George, female    DOB: [birth year], 76 y.o.   MRN: [id number]  HPI 76 year old Female here for discussion of several issues.   She has appt with Neurology next week for assessment of bilateral hand tremors. She had Covid in September. She had a mild case and was treated with Paxlovid.   She has a longstanding history of anxiety and depression and has been maintained on SSRI medication Zoloft for years as well as Wellbutrin. She has DM treated with Januvia and Metformin. She has longstanding anxiety since young adulthood treated for years with Tranxene. She has hyperlipidemia treated with Zocor.She divorced in the 1990s and lives alone. Has one son and one daughter.  Seen in October for fatigue after Covid-19. Told me then  her son and daughter-in-law are divorcing. Was complaining of leg weakness. Was concerned about a letter she received on a shortage of Tranxene. Was having insomnia.Was complaining of leg weakness right leg pain below knee the previous week.her muscle strength was felt to be normal. I drew numerous labs including RF CCP ANA TSH free T4 Hgb AIC Fe/TIBC B12 and folate. Folate was normal. B12 was low normal at 275. Recommended trying B12 injections which she now has questions about. Has read some info on [employer]  She was referred to [facility] for counseling  and med consult in late October.Started on Wellbutrin XL 300 mg daily. Had been on 150 mg XL daily.  Patient messaged me in late October asking about Long Covid.Told her I really had not seen long Covid at all during this entire pandemic.  In December, she complained of some balance issues. Felt her gait was unsteady.She had a fall from a high bed evaluated in early January. Lost her balance getting out of bed steeping onto foot stool to get out of bed. Struck her head.Had CT of brain January 5th showing no CNS bleed or skull fracture.  With regard to Rheumatology studies, she  has weakly positive CCP. I am not convinced she has Rheumatoid arthritis but she is being referred to Rheumatologist. ANA is negative TSH in October was normal.  What is most concerning to me is the bilateral hand tremor. I do not know if this is Parkinson's disease or an essential tremor. This is the reason for the Neurology referral.  Her B12 level in early December after starting b12 injections monthly was 336 increased from 275 when checked 2 months previously.  We drew labs on January 4th after her fall at home. Glucose was 100 but BUN was 28 and creatinine was 1.44 consistent with volume depletion. Calcium was slightly elevated at 10.5 which also could be caused by volume depletion.We asked her to hydrate and return on January 6th. There was improvement in creatinine to 1.28 but a year ago, creatinine was 0.96. Calcium was repeated on January 12th and was normal at 9.6. She checks my chart so she could see that the result had returned to normal limits but has called saying this is all confusing to her and I can see how it could be confusing.I have explained that is is important for her to stay well hydrated at all times. Her creatinine has continued to improve and is now 1.14. She needs to stay well hydrated. Even so, at her age she may have some mild chronic kidney disease developing which can certainly be watched. I tried to explain this today and that I simply cannot  call her for detailed discussions about labs that I feel are improving. The creatinine is back to baseline from 6 months and 2 years ago. However, I have apologized to her for not following through as well as I should but the practice is extremely busy, and I feel she was given good close follow up and had upcoming neurology evaluation. I am not sure she wants to continue B12 injections.She has no desire to learn how to administer them at home monthly.  I do not think I am meeting her needs at this time.My time is limited for detailed  phone calls. This has been frustrating and I have apologized for being upset with her.     Review of Systems B12 level has improved after B12 injections.     Objective:   Physical Exam Her BP is stable at 132/82 pulse 80 Temp 98.1 degrees pulse ox room air 97%  Spent some 30 minutes explaining these results today and discussing my decision to give her B12 injections.     Assessment & Plan:  She has Neurology evaluation soon and may well need additional imaging with MRI of the brain.Continues with bilateral hand tremors. May have Parkinson's disease or simply senile tremor.  Have advised her to keep appt with Rheumatology for weakly positive CCP but  I am not sure that she has Rheumatoid Arthritis  She should continue to see [facility] for counseling and medication consultation for anxiety and depression.  I would like for her to continue with monthly B12 injections but she needs to decide if she wants this.  I think the next step in Neurology evaluation.This is scheduled for February 15th. She has appt here in July for annual medicare wellness visit and CPE.  Needs to use CPAP with hx of sleep apnea

## 2021-11-11 NOTE — Telephone Encounter (Signed)
Patient would like a phone call of or appt to go over her labs with her. She states that they are still not normal and you told her you would discuss after this set of testing.

## 2021-11-11 NOTE — Telephone Encounter (Signed)
Scheduled

## 2021-11-12 NOTE — Patient Instructions (Addendum)
Keep appt with Neurologist in February. Decide if you want to continue B12 injections. Keep  initial appt with Rheumaologist and continue to see Ellis Savage.  Your annual CPE has been scheduled for July.

## 2021-11-16 DIAGNOSIS — G4733 Obstructive sleep apnea (adult) (pediatric): Secondary | ICD-10-CM | POA: Diagnosis not present

## 2021-11-17 ENCOUNTER — Other Ambulatory Visit: Payer: Self-pay

## 2021-11-17 ENCOUNTER — Other Ambulatory Visit: Payer: Medicare Other | Admitting: Internal Medicine

## 2021-11-17 DIAGNOSIS — E538 Deficiency of other specified B group vitamins: Secondary | ICD-10-CM | POA: Diagnosis not present

## 2021-11-18 LAB — VITAMIN B12: Vitamin B-12: 429 pg/mL (ref 200–1100)

## 2021-12-01 ENCOUNTER — Other Ambulatory Visit: Payer: Medicare Other | Admitting: Internal Medicine

## 2021-12-14 ENCOUNTER — Other Ambulatory Visit: Payer: Self-pay

## 2021-12-14 ENCOUNTER — Encounter: Payer: Self-pay | Admitting: Neurology

## 2021-12-14 ENCOUNTER — Ambulatory Visit: Payer: Medicare Other | Admitting: Neurology

## 2021-12-14 VITALS — BP 121/76 | HR 81 | Ht 66.0 in | Wt 177.6 lb

## 2021-12-14 DIAGNOSIS — R269 Unspecified abnormalities of gait and mobility: Secondary | ICD-10-CM | POA: Diagnosis not present

## 2021-12-14 DIAGNOSIS — R2689 Other abnormalities of gait and mobility: Secondary | ICD-10-CM | POA: Diagnosis not present

## 2021-12-14 DIAGNOSIS — G250 Essential tremor: Secondary | ICD-10-CM

## 2021-12-14 MED ORDER — PROPRANOLOL HCL 20 MG PO TABS: 20.0000 mg | ORAL_TABLET | Freq: Three times a day (TID) | ORAL | 5 refills | Status: DC

## 2021-12-14 NOTE — Progress Notes (Signed)
Subjective:    Patient ID: Diana George is a 76 y.o. female.  HPI    Diana Age, MD, [name], [name], [name]  Dear Dr. Renold Genta,    I saw your patient, Diana George, upon your kind request, in my neurologic clinic today for initial consultation of her balance problem and tremor.  The patient is accompanied by a friend, Diana George, today.  As you know, Diana George is a 76 year old right-handed woman with an underlying medical history of diabetes, vitamin B12 deficiency, hyperlipidemia, kidney stone, sleep apnea, depression, anxiety, and overweight state, who reports a bilateral hand tremor for at least one year, more so on the left. She has had balance issues for at least one year, had a fall on 10/30/21, as she was getting out of bed.  She reports that her feet got caught on the steps that she has beside the bed to get in and out of her bed as it is a very high bed.  I reviewed your office note from 09/30/2021.  You ordered a head CT.  I reviewed her head CT without contrast from 11/03/2021: Impression: No evidence of acute intracranial abnormality.  Of note, she is on psychotropic medication including Wellbutrin long-acting 150 mg daily, sertraline 100 mg daily, clorazepate 3.75 mg twice daily as needed. She has had recent blood work through your office including vitamin B12 which was 429 on 11/17/2021.  Her BUN was 23 on 11/10/2021.  ANA was negative on 08/25/2021, B12 was 275 at the time, folate normal at 12.2.  Iron studies were benign.  A1c was 6.0, thyroid function test normal including TSH, and free T4.  CCP was elevated at 25, rheumatoid factor negative. She reports a family history of tremors affecting her dad.  He had hand tremors, she does not remember anybody else with tremors in her family.  She is an only child.  She reports that she has been on B12 injections.  She feels that perhaps her balance has been a little bit  better since she started B12 injections.  She sleeps fairly well, estimates that she sleeps about 7 to 8 hours, she uses a CPAP.  She does not drink caffeine on a daily basis, she has improved her hydration and drinks about 6 bottles of water per day, she does not drink any alcohol currently. She has been on Tranxene for years, takes it twice daily.  She does report recent stress what with her son's divorce.  I had evaluated her several years ago for weakness.  Work-up at the time with EMG nerve conduction velocity testing on 08/02/2017 showed no evidence of myopathy or motor neuropathy, she had mild bilateral axonal sensory neuropathy.  Laboratory work-up was benign at the time, she was advised to continue to pursue with good diabetes control.  Previously:   07/19/17: 76 yo right-handed woman with an underlying medical history of diabetes, depression, anxiety, hyperlipidemia, kidney stone, sleep apnea and obesity, who reports an approximately one-month history of feeling weak all over, lack of energy, muscle fatigue. She has not fallen or had one-sided sudden onset of weakness or numbness. She feels intermittent tingling in her left leg. She fell about a month ago and hit the left side of her leg and buttock area. This has improved but she feels like she has no energy. She denies any double vision. She has a family history of tremors on her father's side and has had  a tremor herself but this is not new. I reviewed your office note from 07/09/2017. She has had recent extensive lab work which I reviewed: She has negative titers for [facility] spotted fever, Lyme disease panel negative, prior exposures to EBV and CMV, ESR normal, TSH normal. On 06/25/2017 she had normal CK level, ANA negative, ESR normal, A1c in May 2018 6.2. She reports using her CPAP on a regular basis. She lives alone, has 2 grown children. She is retired from being in childcare for many years. She is a nonsmoker and does not drink  alcohol on a regular basis or caffeine on a regular basis. She tries to hydrate well. She has been on her anxiety and depression medications for years with stable doses.  Her Past Medical History Is Significant For: Past Medical History:  Diagnosis Date   Anxiety    Depression    Diabetes mellitus    History of coronary rotational ablation 2006   Hyperlipidemia    Renal stone    Sleep apnea    uses cpap    Her Past Surgical History Is Significant For: Past Surgical History:  Procedure Laterality Date   CATARACT EXTRACTION W/ INTRAOCULAR LENS  IMPLANT, BILATERAL  2010   heart ablation  2010   LEG SURGERY Left 2011   fracture with hardware   STONE EXTRACTION WITH BASKET  2016, [facility]    Her Family History Is Significant For: Family History  Problem Relation George of Onset   Alzheimer's disease Mother    Alzheimer's disease Father    Heart disease Father    Colon cancer Father 62   Tremor Father     Her Social History Is Significant For: Social History   Socioeconomic History   Marital status: Divorced    Spouse name: Not on file   Number of children: Not on file   Years of education: Not on file   Highest education level: Not on file  Occupational History   Occupation: retired  Tobacco Use   Smoking status: Never   Smokeless tobacco: Never  Substance and Sexual Activity   Alcohol use: No    Alcohol/week: 0.0 standard drinks   Drug use: No   Sexual activity: Not on file  Other Topics Concern   Not on file  Social History Narrative   Not on file   Social Determinants of Health   Financial Resource Strain: Not on file  Food Insecurity: Not on file  Transportation Needs: Not on file  Physical Activity: Not on file  Stress: Not on file  Social Connections: Not on file    Her Allergies Are:  Allergies  Allergen Reactions   Amoxicillin Rash  :   Her Current Medications Are:  Outpatient Encounter Medications as of 12/14/2021   Medication Sig   buPROPion (WELLBUTRIN XL) 150 MG 24 hr tablet TAKE 1 TABLET BY MOUTH DAILY, GENERIC EQUIVALENT FOR WELLBUTRIN XL (Patient taking differently: daily. TAKE 2 TABLET BY MOUTH DAILY, GENERIC EQUIVALENT FOR WELLBUTRIN XL)   Cholecalciferol (VITAMIN D) 2000 UNITS CAPS Take 1 capsule by mouth 2 (two) times daily.   clorazepate (TRANXENE) 3.75 MG tablet TAKE 1 TABLET po TWICE DAILY prn ANXIETY. GENERIC EQUIVALENT FOR TRANXENE.   Cyanocobalamin (VITAMIN B-12 PO) Take by mouth.   CYANOCOBALAMIN IJ Inject as directed every 30 (thirty) days.   Hyoscyamine Sulfate SL (LEVSIN/SL) 0.125 MG SUBL Place 0.125 tablets under the tongue as needed.   ibandronate (BONIVA) 150 MG  tablet Take 1 tablet by mouth every 30 (thirty) days.   meloxicam (MOBIC) 15 MG tablet Take 1 tablet (15 mg total) by mouth daily.   metFORMIN (GLUCOPHAGE) 500 MG tablet TAKE 1 TABLET BY MOUTH TWICE DAILY. GENERIC EQUIVALENT FOR GLUCOPHAGE.   sertraline (ZOLOFT) 100 MG tablet TAKE 1 TABLET BY MOUTH DAILY GENERIC EQUIVALENT FOR ZOLOFT   simvastatin (ZOCOR) 20 MG tablet TAKE 1 TABLET BY MOUTH DAILY GENERIC EQUIVALENT FOR ZOCOR   sitaGLIPtin (JANUVIA) 100 MG tablet Take 1 tablet (100 mg total) by mouth daily.   No facility-administered encounter medications on file as of 12/14/2021.  :   Review of Systems:  Out of a complete 14 point review of systems, all are reviewed and negative with the exception of these symptoms as listed below:   Review of Systems  Neurological:        Pt states she is here for tremors and imbalance issues. Pt states tremors are in both hands but increased on left. Pt states she will be walking and she starts to lean. Pt states she has fell 1 time in the the last month .    Objective:  Neurological Exam  Physical Exam Physical Examination:   Vitals:   12/14/21 1448  BP: 121/76  Pulse: 81    General Examination: The patient is a very pleasant 76 y.o. female in no acute distress. She  appears well-developed and well-nourished and well groomed.   HEENT: Normocephalic, atraumatic, pupils are equal, round and reactive to light. She has corrective eyeglasses. Extraocular tracking is good without limitation to gaze excursion or nystagmus noted. Normal smooth pursuit is noted. Hearing is grossly intact. Face is symmetric with normal facial animation, speech is clear with no dysarthria noted. There is no hypophonia. There is a mild side-to-side head tremor. She has an intermittent slight voice tremor. Neck is supple with full range of passive and active motion. There are no carotid bruits on auscultation. Oropharynx exam reveals: no abnormal findings.  Tongue protrudes centrally and palate elevates symmetrically, mild mouth dryness noted.   Chest: Clear to auscultation without wheezing, rhonchi or crackles noted.   Heart: S1+S2+0, regular and normal without murmurs, rubs or gallops noted.    Abdomen: Soft, non-tender and non-distended.   Extremities: There is no pitting edema in the distal lower extremities bilaterally.    Skin: Warm and dry without trophic changes noted.   Musculoskeletal: exam reveals no obvious joint deformities, unremarkable surgical scars on her left foot.  Unequal shoulder height noted, slight increase in upper back curvature.   Neurologically:  Mental status: The patient is awake, alert and oriented in all 4 spheres. Her immediate and remote memory, attention, language skills and fund of knowledge are appropriate. There is no evidence of aphasia, agnosia, apraxia or anomia. Speech is clear with normal prosody and enunciation. Thought process is linear. Mood is normal and affect is normal.  Cranial nerves II - XII are as described above under HEENT exam.  Motor exam: Normal bulk, strength and tone is noted. There is no drift, no resting tremor.  She has a mild bilateral upper extremity postural tremor and a slight action tremor.  No lower extremity tremor.  On  Archimedes spiral drawing she has mild trembling with the right hand, more coarse trembling and insecurity with the left hand, handwriting is legible, slightly tremulous, not micrographic.   Reflexes are 2+ to 3+ throughout. Babinski: Toes are flexor bilaterally. Fine motor skills and coordination: intact with normal finger  taps, normal hand movements, normal rapid alternating patting, normal foot taps and normal foot agility.  In particular, no decrement in amplitude. Cerebellar testing: No dysmetria or intention tremor on finger to nose testing. Heel to shin is unremarkable bilaterally. There is no truncal or gait ataxia.  Sensory exam: intact to light touch in the upper and lower extremities.  Gait, station and balance: She stands without difficulty.  She walks slightly slowly and cautiously, no shuffling, preserved arm swing bilaterally.  Posture is mildly stooped forward for George.  She may have mild scoliosis.  Assessment and Plan:    In summary, Diana George is a very pleasant 76 year old female with an underlying medical history of diabetes, vitamin B12 deficiency, hyperlipidemia, kidney stone, sleep apnea, depression, anxiety, and overweight state, who presents for evaluation of her balance disturbance for at least 1 year and history of tremors.  She had a head and hand tremor when I evaluated her in 2018.  Otherwise, she appears to be fairly stable.  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  I did not see any telltale signs of parkinsonism and reassured her in that regard.  As far as her balance, she has likely a multifactorial problem with her balance, factors that would affect her balance include tremor, start medications including Tranxene and sertraline, and history of neuropathy.  She had evidence of mild sensory neuropathy when we did neurophysiological testing in 2018.  She has had good blood sugar control generally speaking and A1c has been at goal,  nevertheless, she is advised that even optimal diabetes control can result in diabetic neuropathy and progression thereof.  She is advised to continue to stay well-hydrated and pursuing good diabetes control.  For her essential tremor, I suggested a low-dose trial of propranolol starting at 20 mg once daily with gradual titration weekly to up to 20 mg 3 times daily over the next 3 weeks.  We talked about expectations and side effects of the medication.  She was given instructions in writing on her after visit summary.  She will review this through [facility].  I sent a new prescription for propranolol to her retail pharmacy, [facility].  She is advised to follow-up in this clinic to see one of our nurse practitioners in about 3 months, sooner if needed.  We talked about tremor triggers today.  She is advised to continue follow-up with you as scheduled.  I answered all their questions today the patient and her friend, Diana George, were in agreement. Thank you very much for allowing me to participate in the care of this nice patient. If I can be of any further assistance to you please do not hesitate to call me at [phone].   Sincerely,     Diana Age, MD, PhD

## 2021-12-14 NOTE — Patient Instructions (Addendum)
You have a mild tremor of both hands and your head.  I do not see any signs or symptoms of parkinson's like disease or what we call parkinsonism.   For your tremor, we will try a beta blocker in low dose.   Please remember, that any kind of tremor may be exacerbated by anxiety, anger, nervousness, excitement, dehydration, sleep deprivation, by caffeine, and low blood sugar values or blood sugar fluctuations. Some medications can exacerbate tremors, this includes sertraline.   We will start Inderal (generic name: propranolol) 20 mg strength: take 1 pill each bedtime for 1 week, then 1 pill twice daily for 2 weeks, then 1 pill 3 times a day thereafter. Common side effect reported are: lethargy, sedation, low blood pressure and low pulse rate. Please monitor your BP and Pulse every few days, if your pulse drops lower than 55 you may feel bad and we may have to adjust your dose. Same with your BP below 110/55.   Your balance issue is likely is due to a combination of factors. These factors include: normal aging, having a tremor, taking certain medications, such as Traxene.  Please remember to stand up slowly and get your bearings first turn slowly, no bending down to pick anything, no heavy lifting, be extra careful at night and first thing in the morning. Also, be careful in the Bathroom and the kitchen.   Remember to drink plenty of fluid, eat healthy meals and do not skip any meals. Try to eat protein with a every meal and eat a healthy snack such as fruit or nuts or yogurt in between meals. Try to keep a regular sleep-wake schedule and try to exercise daily, particularly in the form of walking, 20-30 minutes a day, if you can.   Please follow up to see one of our nurse practitioners in about 3-4 months.

## 2021-12-15 NOTE — Progress Notes (Signed)
Patient notified

## 2021-12-19 ENCOUNTER — Other Ambulatory Visit: Payer: Self-pay

## 2021-12-19 ENCOUNTER — Ambulatory Visit (INDEPENDENT_AMBULATORY_CARE_PROVIDER_SITE_OTHER): Payer: Medicare Other

## 2021-12-19 VITALS — BP 98/64 | HR 101

## 2021-12-19 DIAGNOSIS — E538 Deficiency of other specified B group vitamins: Secondary | ICD-10-CM | POA: Diagnosis not present

## 2021-12-19 MED ORDER — CYANOCOBALAMIN 1000 MCG/ML IJ SOLN
1000.0000 ug | Freq: Once | INTRAMUSCULAR | Status: AC
  Administered 2021-12-19: 1000 ug via INTRAMUSCULAR

## 2021-12-19 NOTE — Progress Notes (Signed)
Patient received 1000 mcg vitamin b12 in her right delt. No adverse reactions noted.

## 2022-01-11 DIAGNOSIS — E663 Overweight: Secondary | ICD-10-CM | POA: Diagnosis not present

## 2022-01-11 DIAGNOSIS — Z6828 Body mass index (BMI) 28.0-28.9, adult: Secondary | ICD-10-CM | POA: Diagnosis not present

## 2022-01-11 DIAGNOSIS — M064 Inflammatory polyarthropathy: Secondary | ICD-10-CM | POA: Diagnosis not present

## 2022-01-17 ENCOUNTER — Other Ambulatory Visit: Payer: Self-pay

## 2022-01-17 ENCOUNTER — Ambulatory Visit (INDEPENDENT_AMBULATORY_CARE_PROVIDER_SITE_OTHER): Payer: Medicare Other | Admitting: Internal Medicine

## 2022-01-17 VITALS — BP 110/70

## 2022-01-17 DIAGNOSIS — E538 Deficiency of other specified B group vitamins: Secondary | ICD-10-CM

## 2022-01-17 MED ORDER — CYANOCOBALAMIN 1000 MCG/ML IJ SOLN
1000.0000 ug | Freq: Once | INTRAMUSCULAR | Status: AC
  Administered 2022-01-17: 1000 ug via INTRAMUSCULAR

## 2022-01-17 NOTE — Progress Notes (Signed)
Diana George is a 76 y.o. presents today for injection of Vitamin B12. Patient given 1000 mcg of B12 in her left delt. No adverse reactions noted. ? ?B12 given RTC in one month. Has CPE in July. ? ?I, Margaree Mackintosh, MD, have reviewed all documentation for this visit. The documentation on 01/17/22 for the exam, diagnosis, procedures, and orders are all accurate and complete. ? ?

## 2022-01-18 DIAGNOSIS — F3341 Major depressive disorder, recurrent, in partial remission: Secondary | ICD-10-CM | POA: Diagnosis not present

## 2022-01-24 DIAGNOSIS — G4733 Obstructive sleep apnea (adult) (pediatric): Secondary | ICD-10-CM | POA: Diagnosis not present

## 2022-02-15 ENCOUNTER — Telehealth: Payer: Self-pay | Admitting: Internal Medicine

## 2022-02-15 NOTE — Telephone Encounter (Signed)
Diana George ?[phone] ? ?[name] called to say she is not sure when she or if she is supposed to come back for B12 injection, she stated she thought you had checked with Dr Renold Genta. ?I looked in chart it looks like to me she should come back in 1 month. ?

## 2022-02-15 NOTE — Telephone Encounter (Signed)
scheduled

## 2022-02-16 ENCOUNTER — Ambulatory Visit (INDEPENDENT_AMBULATORY_CARE_PROVIDER_SITE_OTHER): Payer: Medicare Other | Admitting: Internal Medicine

## 2022-02-16 VITALS — BP 98/64 | HR 91 | Temp 97.7°F

## 2022-02-16 DIAGNOSIS — E538 Deficiency of other specified B group vitamins: Secondary | ICD-10-CM

## 2022-02-16 MED ORDER — CYANOCOBALAMIN 1000 MCG/ML IJ SOLN
1000.0000 ug | Freq: Once | INTRAMUSCULAR | Status: AC
  Administered 2022-02-16: 1000 ug via INTRAMUSCULAR

## 2022-02-16 NOTE — Progress Notes (Signed)
Diana George is a 76 y.o. presents today for injection of Vitamin B 12. Patient given 1000 mcg of B12 in right delt. No adverse reactions noted. She will return in one month for the next one.  ? ? ?I, Margaree Mackintosh, MD, have reviewed all documentation for this visit. The documentation on 02/16/22 for the exam, diagnosis, procedures, and orders are all accurate and complete. ?

## 2022-02-16 NOTE — Patient Instructions (Signed)
RTC in one month

## 2022-02-17 ENCOUNTER — Other Ambulatory Visit: Payer: Self-pay | Admitting: Internal Medicine

## 2022-02-17 ENCOUNTER — Other Ambulatory Visit: Payer: Self-pay

## 2022-02-17 MED ORDER — METFORMIN HCL 500 MG PO TABS: ORAL_TABLET | ORAL | 1 refills | Status: DC

## 2022-02-24 ENCOUNTER — Other Ambulatory Visit: Payer: Self-pay

## 2022-02-24 MED ORDER — SIMVASTATIN 20 MG PO TABS: ORAL_TABLET | ORAL | 1 refills | Status: DC

## 2022-03-16 ENCOUNTER — Ambulatory Visit (INDEPENDENT_AMBULATORY_CARE_PROVIDER_SITE_OTHER): Payer: Medicare Other | Admitting: Internal Medicine

## 2022-03-16 DIAGNOSIS — E538 Deficiency of other specified B group vitamins: Secondary | ICD-10-CM | POA: Diagnosis not present

## 2022-03-16 MED ORDER — CYANOCOBALAMIN 1000 MCG/ML IJ SOLN
1000.0000 ug | Freq: Once | INTRAMUSCULAR | Status: AC
  Administered 2022-03-16: 1000 ug via INTRAMUSCULAR

## 2022-03-16 NOTE — Progress Notes (Signed)
Diana George is a 76 y.o. presents today for injection of Vitamin B 12. Patient given 1000 mcg of B12 in right delt. No adverse reactions noted. She will return in one month for the next one.   IMargaree Mackintosh, MD, have reviewed all documentation for this visit. The documentation on 03/16/22 for the exam, diagnosis, procedures, and orders are all accurate and complete.

## 2022-03-30 ENCOUNTER — Ambulatory Visit: Payer: Medicare Other | Admitting: Adult Health

## 2022-03-30 ENCOUNTER — Encounter: Payer: Self-pay | Admitting: Adult Health

## 2022-03-30 VITALS — BP 126/73 | HR 62 | Ht 66.0 in | Wt 179.0 lb

## 2022-03-30 DIAGNOSIS — G250 Essential tremor: Secondary | ICD-10-CM | POA: Diagnosis not present

## 2022-03-30 DIAGNOSIS — R2689 Other abnormalities of gait and mobility: Secondary | ICD-10-CM | POA: Diagnosis not present

## 2022-03-30 MED ORDER — PROPRANOLOL HCL 20 MG PO TABS: 20.0000 mg | ORAL_TABLET | Freq: Two times a day (BID) | ORAL | 5 refills | Status: DC

## 2022-03-30 NOTE — Progress Notes (Signed)
[facility] [address] [place]. [place] [address] [phone]       OFFICE FOLLOW UP NOTE  Ms. Jillyn Hidden Date of Birth:  [birth year] Medical Record Number:  [id number]   Reason for visit: tremors    SUBJECTIVE:   CHIEF COMPLAINT:  Chief Complaint  Patient presents with   Follow-up    RM 2 alone Pt is well and stable, tremors have improved     HPI:   ANALESE SOVINE is a 76 y.o. female who is followed by Dr. Rexene Alberts for balance disturbance and history of tremors.   Update 03/30/2022 JM: Patient returns for follow-up after prior visit with Dr. Rexene Alberts just over 3 months ago unaccompanied.  She was started on propranolol at prior visit currently taking $RemoveBeforeDE'20mg'miCifWUTLTdFcLo$  twice daily.  Denies side effects.  Reports excellent benefit at this dose.  Does have very slight tremor in left hand, unable to appreciate right hand tremor.  Occasionally left pointer finger will "go crazy" but does not happen often. Does not interfere with daily activity or functioning. Gait has also improved since prior visit, she has not had any falls since prior visit. Does not use any assistive device.  No further concerns at this time.     History provided for reference purposes only Consult visit 12/14/2021 Dr. Rexene Alberts: Ms. Klimaszewski is a 76 year old right-handed woman with an underlying medical history of diabetes, vitamin B12 deficiency, hyperlipidemia, kidney stone, sleep apnea, depression, anxiety, and overweight state, who reports a bilateral hand tremor for at least one year, more so on the left. She has had balance issues for at least one year, had a fall on 10/30/21, as she was getting out of bed.  She reports that her feet got caught on the steps that she has beside the bed to get in and out of her bed as it is a very high bed.  I reviewed your office note from 09/30/2021.  You ordered a head CT.  I reviewed her head CT without contrast from 11/03/2021: Impression: No evidence of acute intracranial  abnormality.  Of note, she is on psychotropic medication including Wellbutrin long-acting 150 mg daily, sertraline 100 mg daily, clorazepate 3.75 mg twice daily as needed. She has had recent blood work through your office including vitamin B12 which was 429 on 11/17/2021.  Her BUN was 23 on 11/10/2021.  ANA was negative on 08/25/2021, B12 was 275 at the time, folate normal at 12.2.  Iron studies were benign.  A1c was 6.0, thyroid function test normal including TSH, and free T4.  CCP was elevated at 25, rheumatoid factor negative. She reports a family history of tremors affecting her dad.  He had hand tremors, she does not remember anybody else with tremors in her family.  She is an only child.  She reports that she has been on B12 injections.  She feels that perhaps her balance has been a little bit better since she started B12 injections.  She sleeps fairly well, estimates that she sleeps about 7 to 8 hours, she uses a CPAP.  She does not drink caffeine on a daily basis, she has improved her hydration and drinks about 6 bottles of water per day, she does not drink any alcohol currently. She has been on Tranxene for years, takes it twice daily.  She does report recent stress what with her son's divorce.   I had evaluated her several years ago for weakness.  Work-up at the time with EMG nerve conduction  velocity testing on 08/02/2017 showed no evidence of myopathy or motor neuropathy, she had mild bilateral axonal sensory neuropathy.  Laboratory work-up was benign at the time, she was advised to continue to pursue with good diabetes control.   Previously:    07/19/17: 76 yo right-handed woman with an underlying medical history of diabetes, depression, anxiety, hyperlipidemia, kidney stone, sleep apnea and obesity, who reports an approximately one-month history of feeling weak all over, lack of energy, muscle fatigue. She has not fallen or had one-sided sudden onset of weakness or numbness. She feels  intermittent tingling in her left leg. She fell about a month ago and hit the left side of her leg and buttock area. This has improved but she feels like she has no energy. She denies any double vision. She has a family history of tremors on her father's side and has had a tremor herself but this is not new. I reviewed your office note from 07/09/2017. She has had recent extensive lab work which I reviewed: She has negative titers for [facility] spotted fever, Lyme disease panel negative, prior exposures to EBV and CMV, ESR normal, TSH normal. On 06/25/2017 she had normal CK level, ANA negative, ESR normal, A1c in May 2018 6.2. She reports using her CPAP on a regular basis. She lives alone, has 2 grown children. She is retired from being in childcare for many years. She is a nonsmoker and does not drink alcohol on a regular basis or caffeine on a regular basis. She tries to hydrate well. She has been on her anxiety and depression medications for years with stable doses.         ROS:   14 system review of systems performed and negative with exception of those listed in HPI  PMH:  Past Medical History:  Diagnosis Date   Anxiety    Depression    Diabetes mellitus    History of coronary rotational ablation 2006   Hyperlipidemia    Renal stone    Sleep apnea    uses cpap    PSH:  Past Surgical History:  Procedure Laterality Date   CATARACT EXTRACTION W/ INTRAOCULAR LENS  IMPLANT, BILATERAL  2010   heart ablation  2010   LEG SURGERY Left 2011   fracture with hardware   STONE EXTRACTION WITH BASKET  2016, 1977   TUBAL LIGATION  1977    Social History:  Social History   Socioeconomic History   Marital status: Divorced    Spouse name: Not on file   Number of children: Not on file   Years of education: Not on file   Highest education level: Not on file  Occupational History   Occupation: retired  Tobacco Use   Smoking status: Never   Smokeless tobacco: Never  Substance  and Sexual Activity   Alcohol use: No    Alcohol/week: 0.0 standard drinks   Drug use: No   Sexual activity: Not on file  Other Topics Concern   Not on file  Social History Narrative   Not on file   Social Determinants of Health   Financial Resource Strain: Not on file  Food Insecurity: Not on file  Transportation Needs: Not on file  Physical Activity: Not on file  Stress: Not on file  Social Connections: Not on file  Intimate Partner Violence: Not on file    Family History:  Family History  Problem Relation Age of Onset   Alzheimer's disease Mother    Alzheimer's disease Father  Heart disease Father    Colon cancer Father 52   Tremor Father     Medications:   Current Outpatient Medications on File Prior to Visit  Medication Sig Dispense Refill   buPROPion (WELLBUTRIN XL) 150 MG 24 hr tablet TAKE 1 TABLET BY MOUTH DAILY, GENERIC EQUIVALENT FOR WELLBUTRIN XL (Patient taking differently: daily. TAKE 2 TABLET BY MOUTH DAILY, GENERIC EQUIVALENT FOR WELLBUTRIN XL) 90 tablet 3   Cholecalciferol (VITAMIN D) 2000 UNITS CAPS Take 1 capsule by mouth 2 (two) times daily.     clorazepate (TRANXENE) 3.75 MG tablet TAKE 1 TABLET po TWICE DAILY prn ANXIETY. GENERIC EQUIVALENT FOR TRANXENE. 180 tablet 3   CYANOCOBALAMIN IJ Inject as directed every 30 (thirty) days.     Hyoscyamine Sulfate SL (LEVSIN/SL) 0.125 MG SUBL Place 0.125 tablets under the tongue as needed. 60 tablet 1   JANUVIA 100 MG tablet TAKE 1 TABLET BY MOUTH DAILY 90 tablet 0   metFORMIN (GLUCOPHAGE) 500 MG tablet TAKE 1 TABLET BY MOUTH TWICE DAILY. GENERIC EQUIVALENT FOR GLUCOPHAGE. 180 tablet 1   propranolol (INDERAL) 20 MG tablet Take 1 tablet (20 mg total) by mouth 3 (three) times daily. Follow titration instructions provided separately in writing. (Patient taking differently: Take 20 mg by mouth 2 (two) times daily.) 90 tablet 5   sertraline (ZOLOFT) 100 MG tablet TAKE 1 TABLET BY MOUTH DAILY GENERIC EQUIVALENT FOR  ZOLOFT 90 tablet 0   simvastatin (ZOCOR) 20 MG tablet TAKE 1 TABLET BY MOUTH DAILY GENERIC EQUIVALENT FOR ZOCOR 90 tablet 1   Cyanocobalamin (VITAMIN B-12 PO) Take by mouth. (Patient not taking: Reported on 03/30/2022)     ibandronate (BONIVA) 150 MG tablet Take 1 tablet by mouth every 30 (thirty) days. (Patient not taking: Reported on 03/30/2022)     meloxicam (MOBIC) 15 MG tablet Take 1 tablet (15 mg total) by mouth daily. (Patient not taking: Reported on 03/30/2022) 30 tablet 1   No current facility-administered medications on file prior to visit.    Allergies:   Allergies  Allergen Reactions   Amoxicillin Rash      OBJECTIVE:  Physical Exam  Vitals:   03/30/22 1511  BP: 126/73  Pulse: 62  Weight: 179 lb (81.2 kg)  Height: $Remove'5\' 6"'iNRRuVi$  (1.676 m)   Body mass index is 28.89 kg/m. No results found.   General: well developed, well nourished, very pleasant elderly Caucasian female, seated, in no evident distress Head: head normocephalic and atraumatic.   Neck: supple with no carotid or supraclavicular bruits Cardiovascular: regular rate and rhythm, no murmurs Musculoskeletal: no deformity Skin:  no rash/petichiae Vascular:  Normal pulses all extremities   Neurologic Exam Mental Status: Awake and fully alert.  Fluent speech and language.  Slight vocal tremor.  Slight side-to-side head tremor.  Oriented to place and time. Recent and remote memory intact. Attention span, concentration and fund of knowledge appropriate. Mood and affect appropriate.  Cranial Nerves: Pupils equal, briskly reactive to light. Extraocular movements full without nystagmus. Visual fields full to confrontation. Hearing intact. Facial sensation intact. Face, tongue, palate moves normally and symmetrically.  Motor: Normal bulk and tone. Normal strength in all tested extremity muscles.  No evidence of resting tremor.  Slight RUE postural tremor and mild action tremor.  No evidence of tremor LUE or BLE. No evidence of  cogwheel rigidity  Sensory.: intact to touch , pinprick , position and vibratory sensation.  Coordination: Rapid alternating movements normal in all extremities. Finger-to-nose and heel-to-shin performed accurately bilaterally. Gait  and Station: Arises from chair without difficulty. Stance is slightly hunched forward. Gait demonstrates slightly slowed cautious steps and mild unsteadiness.  No evidence of shuffling and appropriate arm swing.  Does not use assistive device. Reflexes: 1+ and symmetric. Toes downgoing.         ASSESSMENT: ALTHIA EGOLF is a 76 y.o. year old female with history of tremors, likely essential tremor, and likely multifactorial gait impairment.      PLAN:  Essential tremor: Significantly improved on propranolol.  Continue propranolol 20 mg twice daily.  Advised to call in the future for possible need of dosage increase. Gait impairment: Improved since prior visit.  No falls.  Continue to monitor.    Follow up in 6 months or call earlier if needed   CC:  PCP: Elby Showers, MD    I spent 24 minutes of face-to-face and non-face-to-face time with patient.  This included previsit chart review, lab review, study review, order entry, electronic health record documentation, patient education and discussion regarding essential tremor and current medication use, chronic gait impairment and answered all other questions to patient satisfaction   Frann Rider, [facility]  [facility] [address] [place], [place] [address]  Phone [phone] Fax [phone] Note: This document was prepared with digital dictation and possible smart phrase technology. Any transcriptional errors that result from this process are unintentional.

## 2022-03-30 NOTE — Patient Instructions (Addendum)
Your Plan:  Continue propranolol 20mg  twice daily - please call in the future if dosage is needed to be increased     Follow up in 6 months or call earlier if needed     Thank you for coming to see at [facility]. I hope we have been able to provide you high quality care today.  You may receive a patient satisfaction survey over the next few weeks. We would appreciate your feedback and comments so that we may continue to improve ourselves and the health of our patients.

## 2022-04-13 ENCOUNTER — Ambulatory Visit (INDEPENDENT_AMBULATORY_CARE_PROVIDER_SITE_OTHER): Payer: Medicare Other | Admitting: Internal Medicine

## 2022-04-13 VITALS — BP 108/62 | HR 81

## 2022-04-13 DIAGNOSIS — E538 Deficiency of other specified B group vitamins: Secondary | ICD-10-CM | POA: Diagnosis not present

## 2022-04-13 MED ORDER — CYANOCOBALAMIN 1000 MCG/ML IJ SOLN
1000.0000 ug | Freq: Once | INTRAMUSCULAR | Status: AC
  Administered 2022-04-13: 1000 ug via INTRAMUSCULAR

## 2022-04-13 NOTE — Progress Notes (Signed)
Diana George is a 76 y.o. presents today for injection of B 12. Patient given 1000 mcg  of B 12 in left delt. No adverse reactions noted.   CMA gave B12 injection to patient today. RTC in one month.MJB, MD  I, Margaree Mackintosh, MD, have reviewed all documentation for this visit. The documentation on 04/13/22 for the exam, diagnosis, procedures, and orders are all accurate and complete.

## 2022-05-04 ENCOUNTER — Other Ambulatory Visit: Payer: Self-pay | Admitting: Internal Medicine

## 2022-05-19 ENCOUNTER — Other Ambulatory Visit: Payer: Medicare Other

## 2022-05-19 DIAGNOSIS — R5383 Other fatigue: Secondary | ICD-10-CM | POA: Diagnosis not present

## 2022-05-19 DIAGNOSIS — F32A Depression, unspecified: Secondary | ICD-10-CM

## 2022-05-19 DIAGNOSIS — E782 Mixed hyperlipidemia: Secondary | ICD-10-CM

## 2022-05-19 DIAGNOSIS — E119 Type 2 diabetes mellitus without complications: Secondary | ICD-10-CM

## 2022-05-20 LAB — CBC WITH DIFFERENTIAL/PLATELET
Absolute Monocytes: 656 cells/uL (ref 200–950)
Basophils Absolute: 73 cells/uL (ref 0–200)
Basophils Relative: 0.9 %
Eosinophils Absolute: 664 cells/uL — ABNORMAL HIGH (ref 15–500)
Eosinophils Relative: 8.2 %
HCT: 39 % (ref 35.0–45.0)
Hemoglobin: 13.1 g/dL (ref 11.7–15.5)
Lymphs Abs: 2535 cells/uL (ref 850–3900)
MCH: 32.5 pg (ref 27.0–33.0)
MCHC: 33.6 g/dL (ref 32.0–36.0)
MCV: 96.8 fL (ref 80.0–100.0)
MPV: 11.7 fL (ref 7.5–12.5)
Monocytes Relative: 8.1 %
Neutro Abs: 4172 cells/uL (ref 1500–7800)
Neutrophils Relative %: 51.5 %
Platelets: 320 10*3/uL (ref 140–400)
RBC: 4.03 10*6/uL (ref 3.80–5.10)
RDW: 12 % (ref 11.0–15.0)
Total Lymphocyte: 31.3 %
WBC: 8.1 10*3/uL (ref 3.8–10.8)

## 2022-05-20 LAB — COMPLETE METABOLIC PANEL WITH GFR
AG Ratio: 2.3 (calc) (ref 1.0–2.5)
ALT: 19 U/L (ref 6–29)
AST: 18 U/L (ref 10–35)
Albumin: 4.5 g/dL (ref 3.6–5.1)
Alkaline phosphatase (APISO): 65 U/L (ref 37–153)
BUN/Creatinine Ratio: 18 (calc) (ref 6–22)
BUN: 22 mg/dL (ref 7–25)
CO2: 28 mmol/L (ref 20–32)
Calcium: 9.6 mg/dL (ref 8.6–10.4)
Chloride: 103 mmol/L (ref 98–110)
Creat: 1.23 mg/dL — ABNORMAL HIGH (ref 0.60–1.00)
Globulin: 2 g/dL (calc) (ref 1.9–3.7)
Glucose, Bld: 116 mg/dL — ABNORMAL HIGH (ref 65–99)
Potassium: 5.2 mmol/L (ref 3.5–5.3)
Sodium: 139 mmol/L (ref 135–146)
Total Bilirubin: 0.6 mg/dL (ref 0.2–1.2)
Total Protein: 6.5 g/dL (ref 6.1–8.1)
eGFR: 46 mL/min/{1.73_m2} — ABNORMAL LOW (ref >=60)

## 2022-05-20 LAB — LIPID PANEL
Cholesterol: 142 mg/dL (ref <200)
HDL: 56 mg/dL (ref >=50)
LDL Cholesterol (Calc): 69 mg/dL (calc)
Non-HDL Cholesterol (Calc): 86 mg/dL (calc) (ref <130)
Total CHOL/HDL Ratio: 2.5 (calc) (ref <5.0)
Triglycerides: 85 mg/dL (ref <150)

## 2022-05-20 LAB — MICROALBUMIN, URINE: Microalb, Ur: 2.1 mg/dL

## 2022-05-20 LAB — TSH: TSH: 5.53 mIU/L — ABNORMAL HIGH (ref 0.40–4.50)

## 2022-05-20 LAB — HEMOGLOBIN A1C
Hgb A1c MFr Bld: 5.9 % of total Hgb — ABNORMAL HIGH (ref <5.7)
Mean Plasma Glucose: 123 mg/dL
eAG (mmol/L): 6.8 mmol/L

## 2022-05-22 ENCOUNTER — Encounter: Payer: Self-pay | Admitting: Internal Medicine

## 2022-05-22 ENCOUNTER — Ambulatory Visit (INDEPENDENT_AMBULATORY_CARE_PROVIDER_SITE_OTHER): Payer: Medicare Other | Admitting: Internal Medicine

## 2022-05-22 VITALS — BP 112/72 | HR 67 | Temp 97.7°F | Ht 65.0 in | Wt 177.0 lb

## 2022-05-22 DIAGNOSIS — R7989 Other specified abnormal findings of blood chemistry: Secondary | ICD-10-CM

## 2022-05-22 DIAGNOSIS — E538 Deficiency of other specified B group vitamins: Secondary | ICD-10-CM | POA: Diagnosis not present

## 2022-05-22 DIAGNOSIS — Z0000 Encounter for general adult medical examination without abnormal findings: Secondary | ICD-10-CM | POA: Diagnosis not present

## 2022-05-22 DIAGNOSIS — Z87442 Personal history of urinary calculi: Secondary | ICD-10-CM

## 2022-05-22 DIAGNOSIS — F32A Depression, unspecified: Secondary | ICD-10-CM

## 2022-05-22 DIAGNOSIS — R5383 Other fatigue: Secondary | ICD-10-CM

## 2022-05-22 DIAGNOSIS — G4733 Obstructive sleep apnea (adult) (pediatric): Secondary | ICD-10-CM

## 2022-05-22 DIAGNOSIS — J3089 Other allergic rhinitis: Secondary | ICD-10-CM

## 2022-05-22 DIAGNOSIS — E782 Mixed hyperlipidemia: Secondary | ICD-10-CM

## 2022-05-22 DIAGNOSIS — J301 Allergic rhinitis due to pollen: Secondary | ICD-10-CM

## 2022-05-22 DIAGNOSIS — N1831 Chronic kidney disease, stage 3a: Secondary | ICD-10-CM | POA: Diagnosis not present

## 2022-05-22 DIAGNOSIS — E1169 Type 2 diabetes mellitus with other specified complication: Secondary | ICD-10-CM

## 2022-05-22 DIAGNOSIS — F419 Anxiety disorder, unspecified: Secondary | ICD-10-CM

## 2022-05-22 DIAGNOSIS — Z9889 Other specified postprocedural states: Secondary | ICD-10-CM

## 2022-05-22 DIAGNOSIS — F439 Reaction to severe stress, unspecified: Secondary | ICD-10-CM

## 2022-05-22 DIAGNOSIS — E785 Hyperlipidemia, unspecified: Secondary | ICD-10-CM

## 2022-05-22 LAB — POCT URINALYSIS DIPSTICK
Bilirubin, UA: NEGATIVE
Blood, UA: NEGATIVE
Glucose, UA: NEGATIVE
Ketones, UA: NEGATIVE
Leukocytes, UA: NEGATIVE
Nitrite, UA: NEGATIVE
Protein, UA: NEGATIVE
Spec Grav, UA: 1.015 (ref 1.010–1.025)
Urobilinogen, UA: 0.2 E.U./dL
pH, UA: 6.5 (ref 5.0–8.0)

## 2022-05-22 MED ORDER — CYANOCOBALAMIN 1000 MCG/ML IJ SOLN
1000.0000 ug | Freq: Once | INTRAMUSCULAR | Status: AC
  Administered 2022-05-22: 1000 ug via INTRAMUSCULAR

## 2022-05-22 MED ORDER — CLORAZEPATE DIPOTASSIUM 3.75 MG PO TABS
ORAL_TABLET | ORAL | 1 refills | Status: DC
Start: 2022-05-22 — End: 2023-03-28

## 2022-05-22 NOTE — Progress Notes (Signed)
Subjective:    Patient ID: Diana George, female    DOB: 1946/05/15, 76 y.o.   MRN: [id number]  HPI 76 year old Female seen for Medicare wellness  visit, health maintenance exam, and evaluation of medical issues.  She is complaining of extreme fatigue.  Has situational stress with her son who is going through a divorce.  This was discussed at length today and could be causing her fatigue.  She is trying to help oldest grandson get ready to go to college at [employer].  She has seen Noemi Chapel in the past for counseling and I suggest that she recontact her female for more counseling.  She is not sleeping well at night.  I have suggested we increase Tranxene 3.75 mg to 2 tablets at bedtime.  She will continue to take 1 tablet in the mornings.  She has cut back some on her church work which was a bit burdensome.  Remains on Wellbutrin 300 mg XL daily for depression.  She has taken this for a number of years and is also on Zoloft 100 mg daily.  B12 injection given today with history of B12 deficiency.  She receives these monthly.  B12 level was checked today at her request.  Her TSH is elevated at 5.53.  She is not on thyroid replacement medication and this will be repeated today to see if it is an error.  Hemoglobin A1c stable at 5.9%.  History of type 2 diabetes mellitus treated with Januvia and metformin.  Is on simvastatin for hyperlipidemia and lipid panel is normal.  Her HDL has actually increased from 46 last year to 82 which is now normal.  This is her good cholesterol and protective against heart disease.  LDL is low at 69.  She has history of mild mitral regurgitation.  History of abnormal Cardiolite study in 2002.  Echocardiogram at the time showed mild prolapse of the anterior leaflet of the mitral valve.  This was done due to history of syncope.  She had some mild mitral regurgitation.  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  Had catheter ablation in September  2005.  History of chronic kidney disease followed by [facility].  History of bilateral tubal ligation in 1980.  Stone basket extraction for kidney stones in the mid 1970s.  History of fractured left wrist November 2007 due to a fall in addition to complete tear of ACL, tear of posterior horn of the medial meniscus.  Was started on Boniva for osteopenia with T score -2.4 and left femoral neck, right femoral neck and right forearm April 2022 by Dr. Julien Girt, [place] physician.  She was also advised to take vitamin D supplement and get exercise.  She has history of essential tremor and has been seen by Neurology.  This is felt to be benign.  Has family history of tremor.  Propranolol was recommended by Neurologist.  Had head injury after a fall at home on New Year's Eve December 2022 trying to get out of bed to go to the bathroom.  She fell forward striking her head and had no loss of consciousness.  History of irritable bowel syndrome, musculoskeletal pain which has been treated with meloxicam in the past, hyperlipidemia treated with statin, type 2 diabetes treated with Januvia and metformin.  No longer takes anti-inflammatory meds with history of chronic kidney disease.  Had renal ultrasound in 2016 that was normal.  Seen in 2018 by Dr. Mercy Moore at [facility] for chronic kidney disease.  Needs to avoid NSAIDs.  Social history: She is retired.  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  She is divorced.  1 son and 1 daughter.  Does not smoke.  Seldom drinks alcohol.  Family history: Father died in February 24, 2013 of septicemia with history of MI, coronary artery disease and hematoma of the brain.  He was 76 years old.  In 08/28/2014her mother died with history of dementia and urinary tract infection at age 29.  Has been seen at [facility] after developing generalized leg weakness and fatigue in September 2022 following a COVID injection.  She was treated with  Paxlovid and had rebound COVID symptoms.  She had a mildly positive CCP but was felt that this was a false positive and she did not have systemic inflammatory arthritis or rheumatoid arthritis.  Colonoscopy done 2015-02-25 with 10-year follow-up recommended.  Review of Systems see above- main complaint is fatigue. No chest pain     Objective:   Physical Exam  VS reviewed: BP excellent at 112/72 - she has gained 7 pounds since January 2023. Skin warm and dry.  Nodes none.  TMs clear.  Neck supple.  No thyromegaly.  No carotid bruits.  Chest clear.  Cardiac exam: Regular rate and rhythm.  No ectopy appreciated.  Abdomen soft nondistended without hepatosplenomegaly masses or tenderness.  GYN exam deferred to GYN physician.  No lower extremity pitting edema.  Affect is stressed due to situational stress with her family.  No gross focal deficits on neurological exam.  Essential tremor present but not severe.      Assessment & Plan:    Elevated serum  creatinine- has seen [facility] in 02/24/2017 by Dr. Briant Cedar but not seen recently. Hx CKD Stage 3.  Suggest repeat Creatinine when well hydrated. Needs follow up with [facility].  TSH rechecked today Level on July 21 was 5.53.  Does not have history of hypothyroidism.  Osteopenia treated with Boniva-DEXA scan done by GYN in March 2022 confirms osteopenia.  Continue Boniva.  Type 2 diabetes mellitus treated with Januvia 100 mg daily and metformin 500 mg twice daily  Essential tremor treated with Inderal 20 mg twice daily.  I doubt this is contributing to her fatigue since it is a low dose.  Depression treated with Wellbutrin and Zoloft  Hyperlipidemia treated with Zocor 20 mg daily  Anxiety treated with Tranxene.  Not sleeping well at night and can increase Tranxene to 7.5 mg at bedtime and continue 3.75 mg every morning  B12 level checked today due to complaint of fatigue.  Time spent counseling her regarding taking time for  herself, getting healthy and exercising regularly.  Has cut back some on church duties.  Enjoys spending time with grandchildren however situational stress has her very upset.      Plan: Fatigue- situational stress and not sleeping well.  Increase Tranxene to 2 x 3.75 mg an hour before she goes to bed.  Have checked B12, and repeated TSH today.  Ask her to check in with Ellis Savage at [facility].  She has not seen her in some time.  Hypothyroidism TSH was elevated recently on fasting labs at 5.5.-This was rechecked today  Chronic kidney disease- also need to repeat creatinine when she is well-hydrated.  It was elevated at 1.23 on July 21 but she had no fluids prior to coming to the office.  If creatinine is elevated when well-hydrated, she  needs follow-up with [facility].   Creatinine to be drawn when well-hydrated on July 31.  She should consider Pneumococcal 20 vaccine at her  Pharmacy.  She should also consider tetanus immunization update.  Should consider COVID booster with new vaccine this coming fall.  Records indicate only 1 Shingrix vaccine in 2020.  She was supposed to have 2.  She should check on this as well with her pharmacy.  Essential tremor treated with propranolol per Neurology.  This is low-dose and I doubt contributes to her fatigue.  Had diabetic eye exam at [facility] November 2022   Colonoscopy due 2026 based on recommendations from  GI from procedure done in 2016.  Her previous colonoscopy was normal and she could elect to do Cologuard instead.   Suggest coronary calcium scoring to see if she has occult heart disease with history of diabetes.Order placed to be done at [facility].

## 2022-05-22 NOTE — Patient Instructions (Addendum)
Due to issues with insomnia, I am suggesting she increase clorazepate to 2x 3.75 mg at bedtime which is 7.5 mg total at bedtime and continue with 1 tab which is 3.75 mg in the morning.  Due to fatigue, we will check serum B12 level and TSH.  I think a lot of this is situational stress with her son who is going through divorce.  Recommended that she return to [facility] to see Ellis Savage for counseling.  Patient will call for an appointment.  Given B12 injection today which she receives here monthly.B12 level checked due to complaint of fatigue.  Repeat serum creatinine when she is well-hydrated.  Creatinine was elevated at 1.23 on July 21 but she was fasting. May need recheck with [facility]. Not seen there since 2018.  I can follow up with her in 6 months for OV,  lipid, liver and AIC.  I am recommending coronary calcium  CT scoring at  [facility] as a screen for heart disease since she is fatigued and has risk factor for heard disease ie glucose intolerance. She may call the Radiology Dept there for an appt.

## 2022-05-22 NOTE — Progress Notes (Signed)
     Annual Wellness Visit     Patient: Diana George, Female    DOB: Mar 22, 1946, 76 y.o.   MRN: [id number] Visit Date: 05/22/2022   Subjective    Diana George is a 76 y.o. Female who presents today for her   Annual  health maintenance exam and evaluation of medical issues -  see separate note for Medicare wellness visit done today   Social Hx :divorced  and resides alone   Patient Care Team: Margaree Mackintosh, MD as PCP - General (Internal Medicine)  Review of Systems see CPE   Objective    Vitals: BP 112/72   Pulse 67   Temp 97.7 F (36.5 C) (Tympanic)   Ht 5\' 5"  (1.651 m)   Wt 177 lb (80.3 kg)   SpO2 97%   BMI 29.45 kg/m   Physical Exam- see separate note   Most recent functional status assessment:    05/22/2022   10:11 AM  In your present state of health, do you have any difficulty performing the following activities:  Hearing? 0  Vision? 0  Difficulty concentrating or making decisions? 0  Walking or climbing stairs? 0  Dressing or bathing? 0  Doing errands, shopping? 0  Preparing Food and eating ? N  Using the Toilet? N  In the past six months, have you accidently leaked urine? N  Do you have problems with loss of bowel control? N  Managing your Medications? N  Managing your Finances? N  Housekeeping or managing your Housekeeping? N   Most recent fall risk assessment:    05/22/2022   10:09 AM  Fall Risk   Falls in the past year? 1  Number falls in past yr: 1  Injury with Fall? 0  Risk for fall due to : History of fall(s)  Follow up Falls evaluation completed    Most recent depression screenings:    05/22/2022   10:10 AM 05/20/2021    2:24 PM  PHQ 2/9 Scores  PHQ - 2 Score 2 0  PHQ- 9 Score 7    Most recent cognitive screening:    05/22/2022   10:12 AM  6CIT Screen  What time? 0 points  Count back from 20 0 points  Months in reverse 0 points  Repeat phrase 4 points       Assessment & Plan  Medicare wellness exam completed.  See Dr. 05/24/2022 dictation for evaluation of medical issues.     Annual wellness visit done today including the all of the following: Reviewed patient's Family Medical History Reviewed and updated list of patient's medical providers Assessment of cognitive impairment was done Assessed patient's functional ability Established a written schedule for health screening services Health Risk Assessent Completed and Reviewed  Discussed health benefits of physical activity, and encouraged her to engage in regular exercise appropriate for her age and condition.       Diana Quant, MD, have reviewed all documentation for this visit. The documentation on 05/22/22 for the exam, diagnosis, procedures, and orders are all accurate and complete.    05/24/22, CMA

## 2022-05-23 LAB — TSH: TSH: 2.41 mIU/L (ref 0.40–4.50)

## 2022-05-23 LAB — VITAMIN B12: Vitamin B-12: 328 pg/mL (ref 200–1100)

## 2022-05-29 ENCOUNTER — Other Ambulatory Visit: Payer: Medicare Other

## 2022-05-29 DIAGNOSIS — R7989 Other specified abnormal findings of blood chemistry: Secondary | ICD-10-CM

## 2022-05-30 LAB — CREATININE, SERUM: Creat: 1.19 mg/dL — ABNORMAL HIGH (ref 0.60–1.00)

## 2022-06-08 ENCOUNTER — Encounter (HOSPITAL_BASED_OUTPATIENT_CLINIC_OR_DEPARTMENT_OTHER): Payer: Self-pay

## 2022-06-08 ENCOUNTER — Ambulatory Visit (HOSPITAL_BASED_OUTPATIENT_CLINIC_OR_DEPARTMENT_OTHER)
Admission: RE | Admit: 2022-06-08 | Discharge: 2022-06-08 | Disposition: A | Discharge status: Home / Self Care | Payer: Medicare Other | Source: Ambulatory Visit | Attending: Internal Medicine | Admitting: Internal Medicine

## 2022-06-08 DIAGNOSIS — E785 Hyperlipidemia, unspecified: Secondary | ICD-10-CM | POA: Insufficient documentation

## 2022-06-08 DIAGNOSIS — E1169 Type 2 diabetes mellitus with other specified complication: Secondary | ICD-10-CM | POA: Insufficient documentation

## 2022-06-21 ENCOUNTER — Other Ambulatory Visit: Payer: Self-pay | Admitting: Internal Medicine

## 2022-06-21 ENCOUNTER — Ambulatory Visit (INDEPENDENT_AMBULATORY_CARE_PROVIDER_SITE_OTHER): Payer: Medicare Other

## 2022-06-21 VITALS — BP 108/66 | Temp 98.0°F

## 2022-06-21 DIAGNOSIS — E538 Deficiency of other specified B group vitamins: Secondary | ICD-10-CM | POA: Diagnosis not present

## 2022-06-21 MED ORDER — CYANOCOBALAMIN 1000 MCG/ML IJ SOLN
1000.0000 ug | Freq: Once | INTRAMUSCULAR | Status: AC
  Administered 2022-06-21: 1000 ug via INTRAMUSCULAR

## 2022-06-21 NOTE — Addendum Note (Signed)
Addended by: Mary Sella D on: 06/21/2022 10:24 AM   Modules accepted: Level of Service

## 2022-06-22 ENCOUNTER — Ambulatory Visit: Payer: Medicare Other

## 2022-06-25 IMAGING — CT CT HEAD W/O CM
3 series · 16 of 47 positions shown, 19 images · non-contrast
Comparison: None.

CLINICAL DATA: Head trauma, minor (Age >= 65y) fell getting out of
bed evening [DATE]st. No LOC Had forehead trauma.

EXAM:
CT HEAD WITHOUT CONTRAST
TECHNIQUE: Contiguous axial images were obtained from the base of the skull
through the vertex without intravenous contrast.

[Series 2: head wo · axial · 0.40mm/px · z∈[-161,-26]mm · 10 of 33 slices shown, 13 images]
[im 3/33  brain]
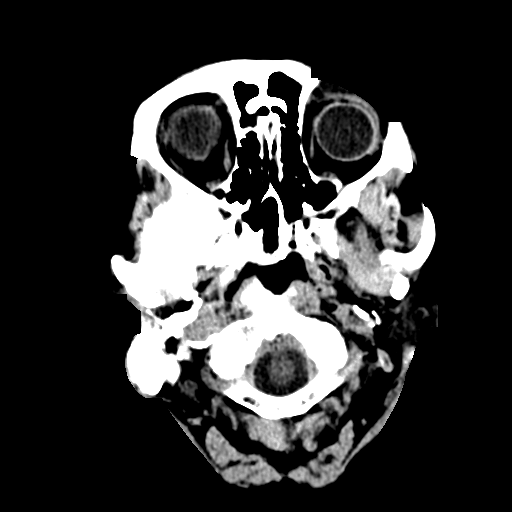
[im 3/33  bone]
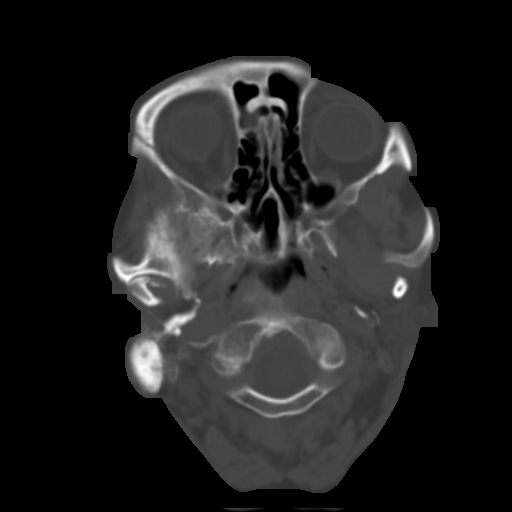
[im 6/33  brain]
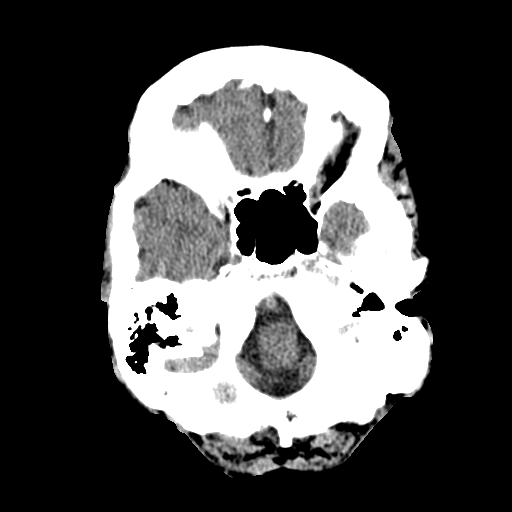
[im 9/33  brain]
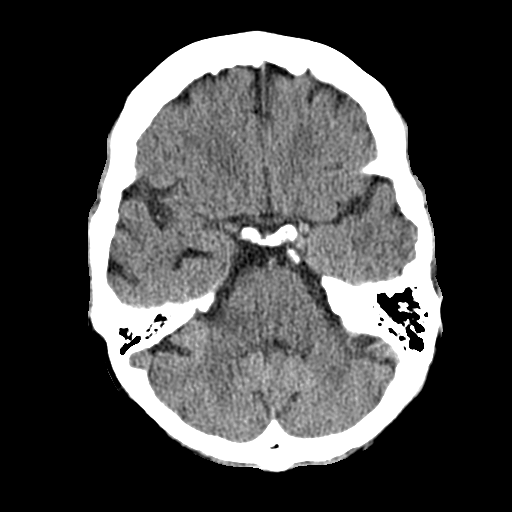
[im 12/33  brain]
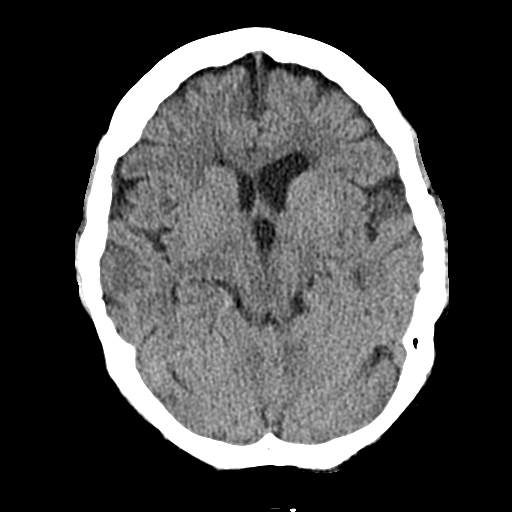
[im 15/33  brain]
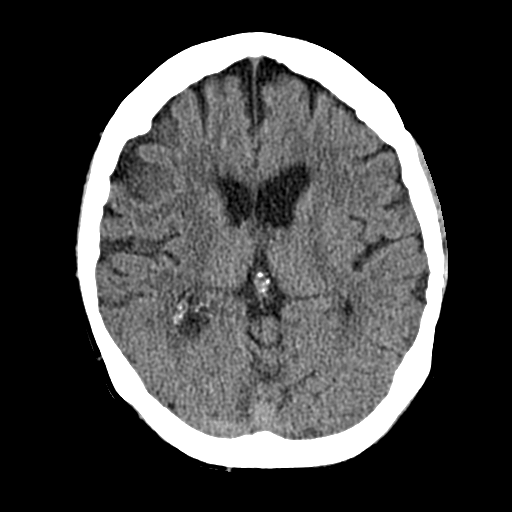
[im 15/33  bone]
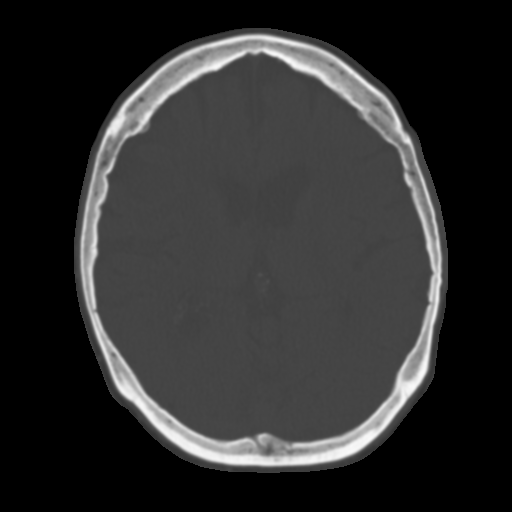
[im 18/33  brain]
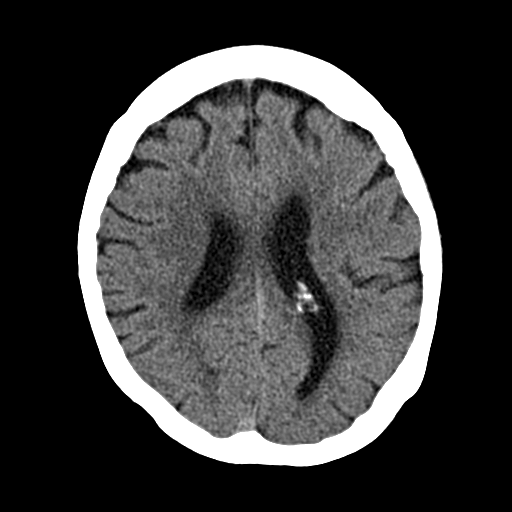
[im 21/33  brain]
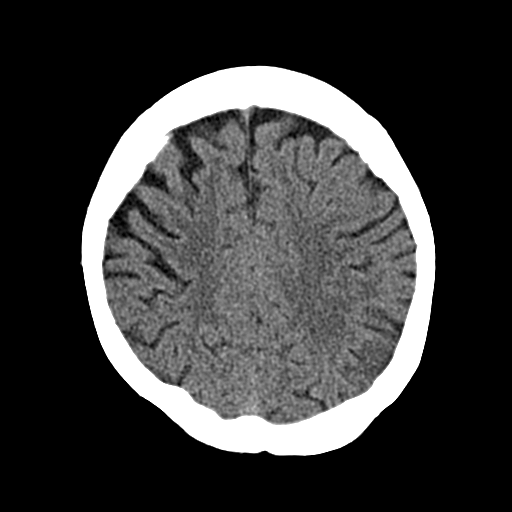
[im 25/33  brain]
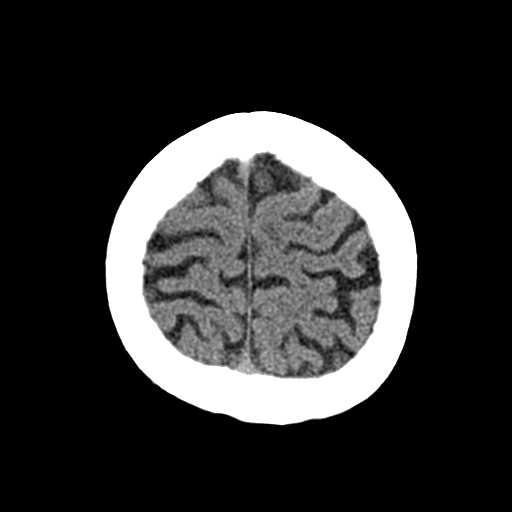
[im 27/33  brain]
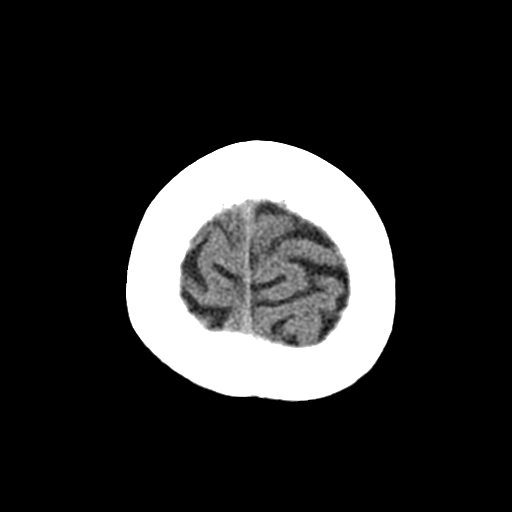
[im 27/33  bone]
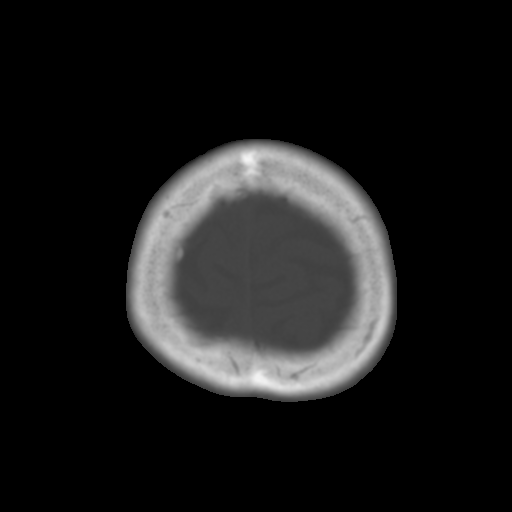
[im 30/33  brain]
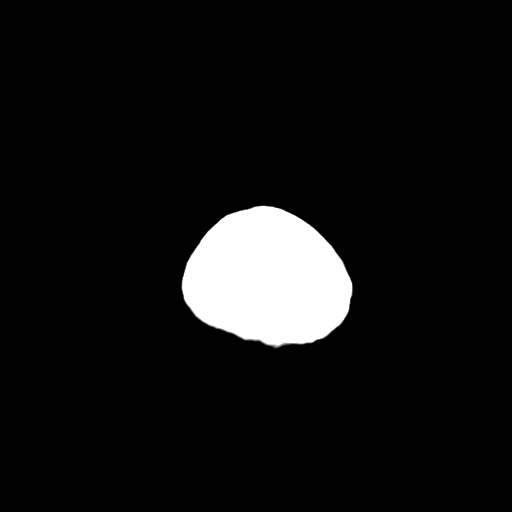

[Series 4: cor soft · coronal · 0.33mm/px · 3 of 64 slices shown]
[im 22/64  brain]
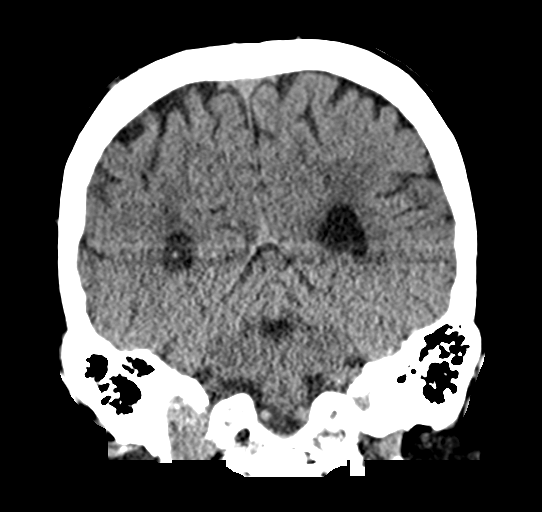
[im 29/64  brain]
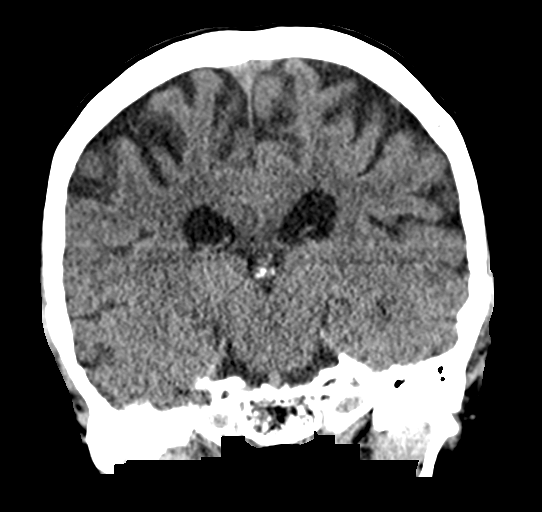
[im 36/64  brain]
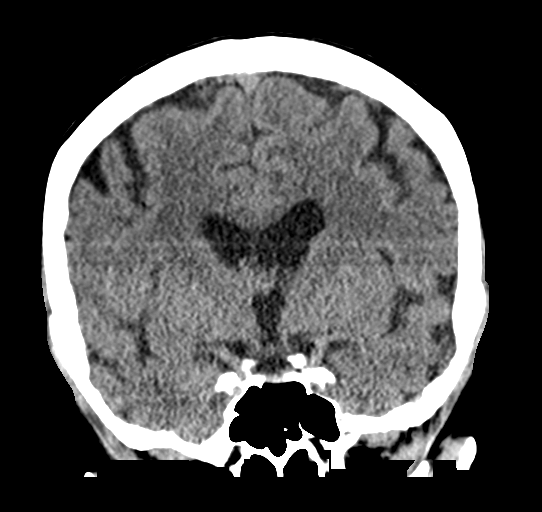

[Series 5: sag soft · sagittal · 0.32mm/px · 3 of 54 slices shown]
[im 18/54  brain]
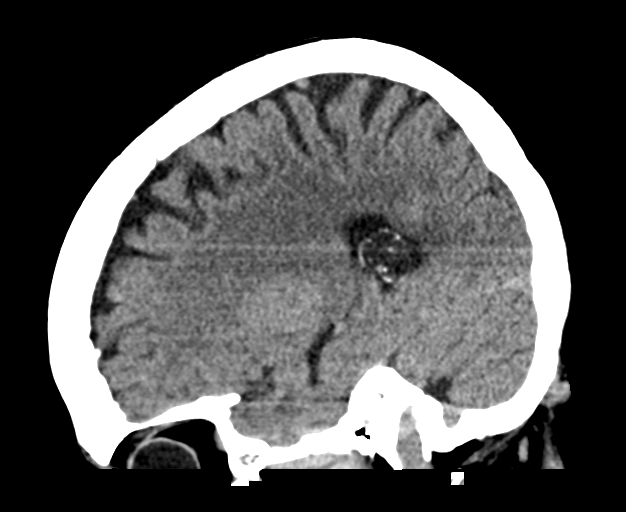
[im 27/54  brain]
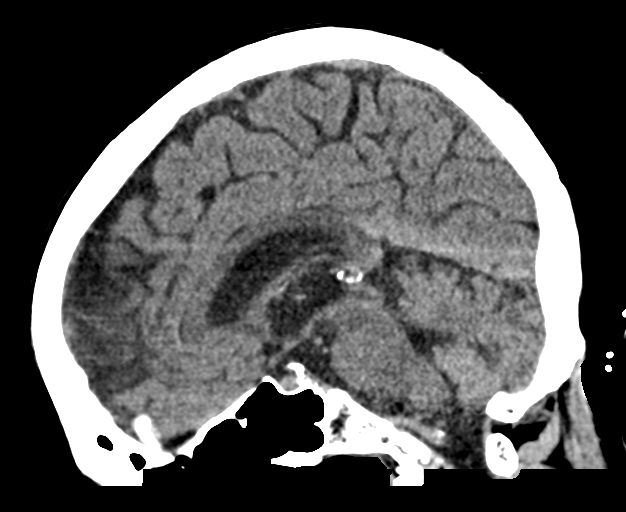
[im 36/54  brain]
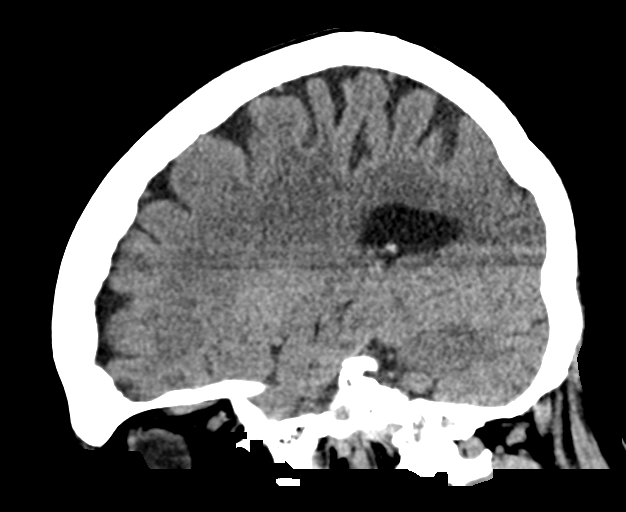

[16 of 47 positions shown; findings below may reference images not displayed]

FINDINGS: Brain: No evidence of acute infarction, hemorrhage, hydrocephalus,
extra-axial collection or mass lesion/mass effect. Mild chronic
microvascular ischemic disease and atrophy.

Vascular: No hyperdense vessel identified. Calcific intracranial
atherosclerosis.

Skull: No acute fracture.

Sinuses/Orbits: Visualized sinuses are clear. Unremarkable orbits.

Other: TMJ degenerative change.  No mastoid effusions
IMPRESSION: No evidence of acute intracranial abnormality.

## 2022-07-18 DIAGNOSIS — F3341 Major depressive disorder, recurrent, in partial remission: Secondary | ICD-10-CM | POA: Diagnosis not present

## 2022-07-19 ENCOUNTER — Ambulatory Visit: Payer: Medicare Other

## 2022-07-20 ENCOUNTER — Ambulatory Visit (INDEPENDENT_AMBULATORY_CARE_PROVIDER_SITE_OTHER): Payer: Medicare Other

## 2022-07-20 VITALS — BP 122/80 | Temp 98.2°F

## 2022-07-20 DIAGNOSIS — E538 Deficiency of other specified B group vitamins: Secondary | ICD-10-CM | POA: Diagnosis not present

## 2022-07-20 MED ORDER — CYANOCOBALAMIN 1000 MCG/ML IJ SOLN
1000.0000 ug | Freq: Once | INTRAMUSCULAR | Status: AC
  Administered 2022-07-20: 1000 ug via INTRAMUSCULAR

## 2022-07-20 NOTE — Progress Notes (Signed)
B12 1 cc IM given by CMA.  Return in 1 month.

## 2022-07-28 ENCOUNTER — Ambulatory Visit: Payer: Medicare Other | Admitting: Internal Medicine

## 2022-07-28 ENCOUNTER — Telehealth: Payer: Self-pay | Admitting: Internal Medicine

## 2022-07-28 NOTE — Telephone Encounter (Signed)
Diana George [phone]  [name] called to say she has had diarrhea all week and is very weak. She stated she got her B12 shot on Thursday and is wandering if it from that, she feels like she got diarrhea last time too, but this time is a lot worse. Do we need to schedule office visit or video visit.

## 2022-07-28 NOTE — Telephone Encounter (Signed)
Scheduled appointment

## 2022-07-31 ENCOUNTER — Ambulatory Visit (INDEPENDENT_AMBULATORY_CARE_PROVIDER_SITE_OTHER): Payer: Medicare Other | Admitting: Internal Medicine

## 2022-07-31 ENCOUNTER — Encounter: Payer: Self-pay | Admitting: Internal Medicine

## 2022-07-31 VITALS — BP 114/80 | HR 62 | Temp 97.9°F | Ht 65.0 in | Wt 162.4 lb

## 2022-07-31 DIAGNOSIS — R5383 Other fatigue: Secondary | ICD-10-CM

## 2022-07-31 DIAGNOSIS — E538 Deficiency of other specified B group vitamins: Secondary | ICD-10-CM | POA: Diagnosis not present

## 2022-07-31 DIAGNOSIS — E1169 Type 2 diabetes mellitus with other specified complication: Secondary | ICD-10-CM

## 2022-07-31 DIAGNOSIS — F32A Depression, unspecified: Secondary | ICD-10-CM

## 2022-07-31 DIAGNOSIS — R197 Diarrhea, unspecified: Secondary | ICD-10-CM | POA: Diagnosis not present

## 2022-07-31 DIAGNOSIS — R634 Abnormal weight loss: Secondary | ICD-10-CM | POA: Diagnosis not present

## 2022-07-31 DIAGNOSIS — N1831 Chronic kidney disease, stage 3a: Secondary | ICD-10-CM

## 2022-07-31 DIAGNOSIS — F419 Anxiety disorder, unspecified: Secondary | ICD-10-CM

## 2022-07-31 DIAGNOSIS — E785 Hyperlipidemia, unspecified: Secondary | ICD-10-CM

## 2022-07-31 NOTE — Progress Notes (Signed)
   Subjective:    Patient ID: Diana George, female    DOB: [birth year], 76 y.o.   MRN: [id number]  HPI 76 year old Female with history of irribale bowel syndrome. Levsin S.L. has been effective recently except for one time.    The last 2 weeks has been awful for her. Can't get to the bathroom in time and has fecal incontinence. Reports green to yellow  colored diarrhea.No travel history. Does not use well water. No one else is ill in her family. She resides alone.  Does not appear to be related to certain foods or caffeine.   Patient feels that something is going on other than irritable bowel syndrome. She is worried about colitis/inflammatory bowel disease.   She does OK with chicken noodle soup and saltine crackers. Eats white bread, peanut butter, bananas and cinnamon as a sandwich.  She has a longstanding history of anxiety and depression.  She is divorced and recently her son is going through a divorce as well.  She takes Wellbutrin and Zoloft and has taken these drugs for many years.  She receives B12 injections monthly.  Thyroid functions have been checked regularly.  In July TSH was 5.53 without history of hypothyroidism.  It was repeated and was felt to be an error.  She has type 2 diabetes mellitus treated with Januvia and metformin.  Metformin may be contributing to diarrhea.  She takes simvastatin for hyperlipidemia.  She has a history of mild mitral regurgitation.  History of SVT in 2005 and saw Dr. Crissie Sickles.  History of chronic kidney disease being followed by [facility].  Was started on Boniva in April 2022 by Dr. Julien Girt, [facility] physician.  History of essential tremor which has been seen by Neurology and felt to be benign.  Also can do a skeletal pain but no longer does this these.  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  She does not smoke and seldom drinks alcohol.  Labs drawn in  July are  reviewed.  Had colonoscopy 2016 with 10 year follow up recommended.This was done by Dr. Olevia Perches at [facility].  Review of Systems see above.     Objective:   Physical Exam BP 114/80 pulse 62 T 97.9 degrees pulse ox 97% weight 162 pounds. BMI 27.02. Weight in Feb 2023 was 177 pounds. Weight in July was 177 pounds. She looks fatigued and appears to be thinner on appearance (15 pound weight loss)  She looks slightly pale. Chest is clear. Cor RRR Abd: soft, non- distended without hepatosplenomegaly, masses, or tenderness.     Assessment & Plan:  Diarrhea- rule out parasites but no travel hx however. No well water exposure.  15 pound weight loss is concerning. Consider inflammatory bowel disease.  Plan:Stool for O and P,  culture for enteric pathogens, Campylobacter ,C. Diff but no recent antibiotics prescribed, WBCs  Referral to Gastroenterology.Stop metformin which can cause diarrhea. May try checking on line for Januvia coupon.

## 2022-07-31 NOTE — Telephone Encounter (Signed)
Called pharmacy about B12 injections, and Meredith the pharmacist said that only 1% could have diarrhea from B12 injections, she said you could try different manufacturer or try oral dose. That would be the options, just the oral dose of course would not give as high of dose as injection.

## 2022-07-31 NOTE — Patient Instructions (Addendum)
Stop metformin. Look online for Januvia coupon. Stool studies recommended. Referral to GI.

## 2022-08-03 DIAGNOSIS — Z6828 Body mass index (BMI) 28.0-28.9, adult: Secondary | ICD-10-CM | POA: Diagnosis not present

## 2022-08-03 DIAGNOSIS — M816 Localized osteoporosis [Lequesne]: Secondary | ICD-10-CM | POA: Diagnosis not present

## 2022-08-03 DIAGNOSIS — Z01419 Encounter for gynecological examination (general) (routine) without abnormal findings: Secondary | ICD-10-CM | POA: Diagnosis not present

## 2022-08-03 DIAGNOSIS — Z1231 Encounter for screening mammogram for malignant neoplasm of breast: Secondary | ICD-10-CM | POA: Diagnosis not present

## 2022-08-14 ENCOUNTER — Other Ambulatory Visit: Payer: Self-pay

## 2022-08-14 DIAGNOSIS — R197 Diarrhea, unspecified: Secondary | ICD-10-CM

## 2022-08-17 ENCOUNTER — Ambulatory Visit (INDEPENDENT_AMBULATORY_CARE_PROVIDER_SITE_OTHER): Payer: Medicare Other

## 2022-08-17 VITALS — BP 122/70 | Temp 98.5°F

## 2022-08-17 DIAGNOSIS — E538 Deficiency of other specified B group vitamins: Secondary | ICD-10-CM

## 2022-08-17 MED ORDER — CYANOCOBALAMIN 1000 MCG/ML IJ SOLN
1000.0000 ug | Freq: Once | INTRAMUSCULAR | Status: AC
  Administered 2022-08-17: 1000 ug via INTRAMUSCULAR

## 2022-08-17 NOTE — Progress Notes (Deleted)
Pt here for monthly B12 injection per   B12 1000mcg given IM, and pt tolerated injection well.  Next B12 injection scheduled for November 

## 2022-08-17 NOTE — Addendum Note (Signed)
Addended by: Geradine Girt D on: 08/17/2022 10:51 AM   Modules accepted: Orders

## 2022-08-17 NOTE — Progress Notes (Signed)
Pt here for monthly B12 injection per   B12 1063mcg given IM, and pt tolerated injection well.  Next B12 injection scheduled for November

## 2022-08-17 NOTE — Addendum Note (Signed)
Addended by: Geradine Girt D on: 08/17/2022 11:13 AM   Modules accepted: Level of Service

## 2022-08-21 ENCOUNTER — Encounter: Payer: Self-pay | Admitting: Physician Assistant

## 2022-08-24 DIAGNOSIS — M810 Age-related osteoporosis without current pathological fracture: Secondary | ICD-10-CM | POA: Diagnosis not present

## 2022-09-13 ENCOUNTER — Ambulatory Visit (INDEPENDENT_AMBULATORY_CARE_PROVIDER_SITE_OTHER): Payer: Medicare Other | Admitting: Internal Medicine

## 2022-09-13 VITALS — BP 114/72 | Temp 98.3°F

## 2022-09-13 DIAGNOSIS — F419 Anxiety disorder, unspecified: Secondary | ICD-10-CM

## 2022-09-13 DIAGNOSIS — E785 Hyperlipidemia, unspecified: Secondary | ICD-10-CM

## 2022-09-13 DIAGNOSIS — E1169 Type 2 diabetes mellitus with other specified complication: Secondary | ICD-10-CM | POA: Diagnosis not present

## 2022-09-13 DIAGNOSIS — F32A Depression, unspecified: Secondary | ICD-10-CM

## 2022-09-13 DIAGNOSIS — K580 Irritable bowel syndrome with diarrhea: Secondary | ICD-10-CM | POA: Diagnosis not present

## 2022-09-13 DIAGNOSIS — E538 Deficiency of other specified B group vitamins: Secondary | ICD-10-CM

## 2022-09-13 MED ORDER — CYANOCOBALAMIN 1000 MCG/ML IJ SOLN
1000.0000 ug | Freq: Once | INTRAMUSCULAR | Status: AC
  Administered 2022-09-13: 1000 ug via INTRAMUSCULAR

## 2022-09-13 NOTE — Patient Instructions (Signed)
1 cc IM B12 given today. RTC in one month. Try taking Levsin one half hour before going out to dinner. Watch lactose containing products. Keep appt with GI in late November.

## 2022-09-24 ENCOUNTER — Encounter: Payer: Self-pay | Admitting: Internal Medicine

## 2022-09-24 NOTE — Progress Notes (Signed)
   Subjective:    Patient ID: Diana George, female    DOB: Mar 19, 1946, 76 y.o.   MRN: [id number]  HPI 76 year old Female here today for monthly B12 injection with history of B12 deficiency. She says her diarrhea had improved at one point but has returned for some unknown reason. Has been given handout on irritable bowel syndrome and had negative stool studies. Denies significant situational stress. She does take Tranxene for anxiety and has been prescribed Levsin in the past. Is on Glucophage and has taken it previously without complaint of diarrhea. Hx of anxiety and depression. Is on Wellbutrin and Zoloft. Is on Simvastatin.    Review of Systems see above- no fever chills, vomiting or abdominal pain.     Objective:   Physical Exam   BP 114/72, T 98.3 degrees by ear thermometer.      Assessment & Plan:   B12 deficiency 1 cc IM B12 given. RTC 4 weeks for next dose. Patient does not want to do this herself at home.  Irritable bowel syndrome rule out inflammatory bowel disease- refer to GI  Type 2 diabetes treated with Glucophage and Januvia  Anxiety and depression treated with Tranxene which should also help diarrhea, Wellbutrin and Zoloft   Referral to GI for diarrhea- has appt late November.  Osteopenia treated with [facility]

## 2022-09-25 NOTE — Progress Notes (Unsigned)
09/27/2022 Diana George [id number] Jan 17, 1946  Referring provider: Elby Showers, MD Primary GI doctor: Dr. Tarri Glenn  ASSESSMENT AND PLAN:   Incontinence of feces with fecal urgency and diarrhea since August with episodes of hard stools and constipation in between- negative FOBT  From the symptoms and history it sounds more like overflow diarrhea, but will need to rule out infection.  Patient is due for colonoscopy 2026, discussed proceeding for possibility of microscopic colitis with nocturnal symptoms however patient would prefer to avoid this if possible.  If stool studies negative and KUB does not show large stool burden, will schedule colonoscopy to evaluate for microscopic colitis. - stool samples to rule out infection pending with PCP -KUB to evaluate for stool burden and rule out possibility of overflow diarrhea  - CRP/ESR to rule out inflammation/IBD  -TTG/IGA to evaluate for celiac disease.  -With urinary incontinence as well and decreased rectal tone, likely component of pelvic floor dysfunction, consider pelvic floor therapy  Add on citracel/benefiber FODMAP, trial off lactulose and lifestyle changes discussed Imodium as needed for now, can take prior to eating or travel Pending labs and response to medication, will schedule endoscopic evaluation  History of colonic polyps 04/09/2015 colonoscopy Dr. Olevia Perches for screening purposes history of adenomatous normal No biopsies Due 03/2025, however if patient has negative labs and continues to have diarrhea we will plan for repeat colonoscopy with biopsies to rule out microscopic colitis.  Patient declined today.    Patient Care Team: Elby Showers, MD as PCP - General (Internal Medicine)  HISTORY OF PRESENT ILLNESS: 76 y.o. female with a past medical history of type 2 diabetes, OSA, hyperlipidemia, anxiety and depression and others listed below presents for evaluation of diarrhea.   11/17/2014 CT abdomen pelvis with  contrast moderate size hiatal hernia, 7 mm nonobstructing stone in right renal pelvis liver cyst 04/09/2015 colonoscopy Dr. Olevia Perches for screening purposes history of adenomatous normal colonoscopy recall 10 years.  No biopsies  She states in Jan she started to feel very poorly every day, her bilateral legs from knee down felt they would "collapse" on her, she had significant work up with her PCP.  She was found to have B12 def 09/2021 of 275, has been on B12 shots and states this has helped her energy.   Then in August she started to have diarrhea.  She was going so frequently she did not have time to sit down before she had urge to go again.  She would have urgency and fecal incontinence.  She had nocturnal symptoms, would have symptoms every 1-2 hours.  Had to do laundry due to night time fecal incontinence.  Liquid stool only for incontinence.  She also has urinary incontinence with urgency. She has had 2 vaginal births 8-9 lbs babies, tubal ligation, no other surgeries.  No vaginal heaviness.   She saw her PCP, metformin was stopped 07/31/22 and she was told to stop lactulose.  Helped some with her symptoms but it has since returned.   She states will have for 2 days of diarrhea and then can have BM that is very hard and painful to get out at the rectum with burning.  No AB pain.  Can have days without a BM as well.  Denies hematochezia. Denies fever, chills. No weight loss.  Mild AB bloating but not significant.  08/14/2022 given stool samples information, negative FOBT.  Pending Cdiff, Campylobacter, fecal leukocytes, O&P.  Denies addition of medications prior, denies ABX  use in last 6 months.  She is on zoloft, not on PPI.    Denies GERD, nausea, vomiting, dysphagia, melena.   CBC  05/19/2022  HGB 13.1 MCV 96.8 without evidence of anemia WBC 8.1 Platelets 320 Anemia panel 08/25/2021  Iron 111 Ferritin 43  05/22/2022  B12 328 Kidney function 05/19/2022  BUN 22 Cr 1.19  GFR 59   Potassium 5.2   LFTs 05/19/2022  AST 18 ALT 19 05/22/2022 TSH 2.41   She  reports that she has never smoked. She has never used smokeless tobacco. She reports that she does not drink alcohol and does not use drugs.  Current Medications:   Current Outpatient Medications (Endocrine & Metabolic):    ibandronate (BONIVA) 150 MG tablet, Take 1 tablet by mouth every 30 (thirty) days.   JANUVIA 100 MG tablet, TAKE 1 TABLET BY MOUTH DAILY   metFORMIN (GLUCOPHAGE) 500 MG tablet, TAKE 1 TABLET BY MOUTH TWICE DAILY. GENERIC EQUIVALENT FOR GLUCOPHAGE. (Patient not taking: Reported on 09/27/2022)  Current Outpatient Medications (Cardiovascular):    propranolol (INDERAL) 20 MG tablet, Take 1 tablet (20 mg total) by mouth 2 (two) times daily.   simvastatin (ZOCOR) 20 MG tablet, TAKE 1 TABLET BY MOUTH DAILY GENERIC EQUIVALENT FOR ZOCOR   Current Outpatient Medications (Analgesics):    meloxicam (MOBIC) 15 MG tablet, Take 1 tablet (15 mg total) by mouth daily.  Current Outpatient Medications (Hematological):    CYANOCOBALAMIN IJ, Inject as directed every 30 (thirty) days.  Current Outpatient Medications (Other):    buPROPion (WELLBUTRIN XL) 300 MG 24 hr tablet, Take 300 mg by mouth every morning.   Cholecalciferol (VITAMIN D) 2000 UNITS CAPS, Take 1 capsule by mouth 2 (two) times daily.   clorazepate (TRANXENE) 3.75 MG tablet, One tab q am and 2 tabs at bedtime   Hyoscyamine Sulfate SL (LEVSIN/SL) 0.125 MG SUBL, Place 0.125 tablets under the tongue as needed.   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 Family History:  Her family history includes Alzheimer's disease in her father and mother; Colon cancer (age of onset: 28) in her father; Heart disease in her father; Tremor in her father.  REVIEW OF SYSTEMS  : All other systems reviewed and negative except where noted in the History of Present Illness.  PHYSICAL EXAM: BP 106/68 (BP Location: Left Arm, Patient Position: Sitting, Cuff Size: Normal)   Pulse (!) 59   Ht _0  (1.651 m)   Wt 184 lb (83.5 kg)   SpO2 97%   BMI 30.62 kg/m  General:   Pleasant, well developed female in no acute distress Head:   Normocephalic and atraumatic. Eyes:  sclerae anicteric,conjunctive pink  Heart:   regular rate and rhythm Pulm:  Clear anteriorly; no wheezing Abdomen:   Soft, Obese AB, Active bowel sounds. No tenderness . Without  guarding and Without rebound, No organomegaly appreciated. Rectal: Possible healing posterior fissure noted, not tender at this time, decreased rectal tone, with large cavernous rectum, internal hemorrhoids appreciated, no masses, nontender, some brown hard stool palpated at tip of finger. hemoccult Negative Extremities:  Without edema. Msk: Symmetrical without gross deformities. Peripheral pulses intact.  Neurologic:  Alert and  oriented x4;  No focal deficits.  Skin:   Dry and intact without significant lesions or rashes. Psychiatric:  Cooperative. Normal mood and affect.    Vladimir Crofts, PA-C 12:23 PM

## 2022-09-26 DIAGNOSIS — H5203 Hypermetropia, bilateral: Secondary | ICD-10-CM | POA: Diagnosis not present

## 2022-09-26 LAB — HM DIABETES EYE EXAM

## 2022-09-27 ENCOUNTER — Ambulatory Visit: Payer: Medicare Other | Admitting: Physician Assistant

## 2022-09-27 ENCOUNTER — Ambulatory Visit (INDEPENDENT_AMBULATORY_CARE_PROVIDER_SITE_OTHER)
Admission: RE | Admit: 2022-09-27 | Discharge: 2022-09-27 | Disposition: A | Discharge status: Home / Self Care | Payer: Medicare Other | Source: Ambulatory Visit | Attending: Physician Assistant | Admitting: Physician Assistant

## 2022-09-27 ENCOUNTER — Other Ambulatory Visit (INDEPENDENT_AMBULATORY_CARE_PROVIDER_SITE_OTHER): Payer: Medicare Other

## 2022-09-27 ENCOUNTER — Encounter: Payer: Self-pay | Admitting: Internal Medicine

## 2022-09-27 ENCOUNTER — Encounter: Payer: Self-pay | Admitting: Physician Assistant

## 2022-09-27 VITALS — BP 106/68 | HR 59 | Ht 65.0 in | Wt 184.0 lb

## 2022-09-27 DIAGNOSIS — Z860100 Personal history of colon polyps, unspecified: Secondary | ICD-10-CM

## 2022-09-27 DIAGNOSIS — R152 Fecal urgency: Secondary | ICD-10-CM | POA: Diagnosis not present

## 2022-09-27 DIAGNOSIS — Z1211 Encounter for screening for malignant neoplasm of colon: Secondary | ICD-10-CM

## 2022-09-27 DIAGNOSIS — R197 Diarrhea, unspecified: Secondary | ICD-10-CM

## 2022-09-27 DIAGNOSIS — R159 Full incontinence of feces: Secondary | ICD-10-CM

## 2022-09-27 DIAGNOSIS — R110 Nausea: Secondary | ICD-10-CM | POA: Diagnosis not present

## 2022-09-27 LAB — HEMOCCULT GUIAC POC 1CARD (OFFICE): Fecal Occult Blood, POC: NEGATIVE

## 2022-09-27 NOTE — Patient Instructions (Addendum)
Follow up appt with Dr. Orvan Falconer on 12/13/22 at 11:10 am    Your provider has requested that you go to the basement level for lab work before leaving today. Press "B" on the elevator. The lab is located at the first door on the left as you exit the elevator. Your provider has requested that you have an abdominal x ray before leaving today. Please go to the basement floor to our Radiology department for the test.  Here some information about pelvic floor dysfunction. This may be contributing to some of your symptoms. We will continue with our evaluation but I do want you to consider adding on fiber supplement  We could also refer to pelvic floor physical therapy.  Pending labs and xray may repeat colonoscopy sooner.   Pelvic Floor Dysfunction, Female Pelvic floor dysfunction (PFD) is a condition that results when the group of muscles and connective tissues that support the organs in the pelvis (pelvic floor muscles) do not work well. These muscles and their connections form a sling that supports the colon and bladder. In women, they also support the uterus. PFD causes pelvic floor muscles to be too weak, too tight, or both. In PFD, muscle movements are not coordinated. This may cause bowel or bladder problems. It may also cause pain. What are the causes? This condition may be caused by an injury to the pelvic area or by a weakening of pelvic muscles. This often results from pregnancy and childbirth or other types of strain. In many cases, the exact cause is not known. What increases the risk? The following factors may make you more likely to develop this condition: Having chronic bladder tissue inflammation (interstitial cystitis). Being an older person. Being overweight. History of radiation treatment for cancer in the pelvic region. Previous pelvic surgery, such as removal of the uterus (hysterectomy). What are the signs or symptoms? Symptoms of this condition vary and may include: Bladder  symptoms, such as: Trouble starting urination and emptying the bladder. Frequent urinary tract infections. Leaking urine when coughing, laughing, or exercising (stress incontinence). Having to pass urine urgently or frequently. Pain when passing urine. Bowel symptoms, such as: Constipation. Urgent or frequent bowel movements. Incomplete bowel movements. Painful bowel movements. Leaking stool or gas. Unexplained genital or rectal pain. Genital or rectal muscle spasms. Low back pain. Other symptoms may include: A heavy, full, or aching feeling in the vagina. A bulge that protrudes into the vagina. Pain during or after sex. How is this diagnosed? This condition may be diagnosed based on: Your symptoms and medical history. A physical exam. During the exam, your health care provider may check your pelvic muscles for tightness, spasm, pain, or weakness. This may include a rectal exam and a pelvic exam. In some cases, you may have diagnostic tests, such as: Electrical muscle function tests. Urine flow testing. X-ray tests of bowel function. Ultrasound of the pelvic organs. How is this treated? Treatment for this condition depends on the symptoms. Treatment options include: Physical therapy. This may include Kegel exercises to help relax or strengthen the pelvic floor muscles. Biofeedback. This type of therapy provides feedback on how tight your pelvic floor muscles are so that you can learn to control them. Internal or external massage therapy. A treatment that involves electrical stimulation of the pelvic floor muscles to help control pain (transcutaneous electrical nerve stimulation, or TENS). Sound wave therapy (ultrasound) to reduce muscle spasms. Medicines, such as: Muscle relaxants. Bladder control medicines. Surgery to reconstruct or support pelvic floor  muscles may be an option if other treatments do not help. Follow these instructions at home: Activity Do your usual  activities as told by your health care provider. Ask your health care provider if you should modify any activities. Do pelvic floor strengthening or relaxing exercises at home as told by your physical therapist. Lifestyle Maintain a healthy weight. Eat foods that are high in fiber, such as beans, whole grains, and fresh fruits and vegetables. Limit foods that are high in fat and processed sugars, such as fried or sweet foods. Manage stress with relaxation techniques such as yoga or meditation. General instructions If you have problems with leakage: Use absorbable pads or wear padded underwear. Wash frequently with mild soap. Keep your genital and anal area as clean and dry as possible. Ask your health care provider if you should try a barrier cream to prevent skin irritation. Take warm baths to relieve pelvic muscle tension or spasms. Take over-the-counter and prescription medicines only as told by your health care provider. Keep all follow-up visits. How is this prevented? The cause of PFD is not always known, but there are a few things you can do to reduce the risk of developing this condition, including: Staying at a healthy weight. Getting regular exercise. Managing stress. Contact a health care provider if: Your symptoms are not improving with home care. You have signs or symptoms of PFD that get worse at home. You develop new signs or symptoms. You have signs of a urinary tract infection, such as: Fever. Chills. Increased urinary frequency. A burning feeling when urinating. You have not had a bowel movement in 3 days (constipation). Summary Pelvic floor dysfunction results when the muscles and connective tissues in your pelvic floor do not work well. These muscles and their connections form a sling that supports your colon and bladder. In women, they also support the uterus. PFD may be caused by an injury to the pelvic area or by a weakening of pelvic muscles. PFD causes  pelvic floor muscles to be too weak, too tight, or a combination of both. Symptoms may vary from person to person. In most cases, PFD can be treated with physical therapies and medicines. Surgery may be an option if other treatments do not help. This information is not intended to replace advice given to you by your health care provider. Make sure you discuss any questions you have with your health care provider. Document Revised: 02/23/2021 Document Reviewed: 02/23/2021 Elsevier Patient Education  2022 [employer].

## 2022-09-29 ENCOUNTER — Telehealth: Payer: Self-pay | Admitting: Physician Assistant

## 2022-09-29 NOTE — Telephone Encounter (Signed)
Return call to patient. Patient had questions regarding Abdomen X-ray results and Quentin Mulling PA recommendations. Went over results of x-ray and answered questions that patient had about bowel purge. Pt voiced understanding.

## 2022-09-29 NOTE — Telephone Encounter (Signed)
Inbound call from patient stating that she was in to see Marchelle Folks on 11/29 and has some questions that she would like to ask about her after visit summary paperwork. Please advise.

## 2022-10-02 ENCOUNTER — Other Ambulatory Visit: Payer: Self-pay

## 2022-10-02 ENCOUNTER — Ambulatory Visit: Payer: Medicare Other | Admitting: Pulmonary Disease

## 2022-10-02 MED ORDER — SIMVASTATIN 20 MG PO TABS: ORAL_TABLET | ORAL | 1 refills | Status: DC

## 2022-10-05 ENCOUNTER — Telehealth: Payer: Self-pay | Admitting: Pulmonary Disease

## 2022-10-05 NOTE — Progress Notes (Signed)
10/06/22- 76 yoF never smoker with OSA, complicated by Allergic Rhinitis, DM, Hyperlipidemia, Anxiety/Depression, CAD, Irritable Bowel,  Followed by Dr Wynona Neat LOV 01/19/21---------> Diagnosis obstructive sleep apnea in 2005 Has been using CPAP since then Repeat study in 2015 did reveal severe obstructive sleep apnea, initial study was moderate obstructive sleep apnea She currently uses CPAP on a nightly basis, she is compliant with use but does not like using it Usually falls asleep between 12 and 1230 Takes only a few minutes to fall asleep but very significantly No significant awakening Final wake up time about 4 AM She does not feel she is restored whenever she wakes up in the morning takes about an hour to get herself going -She called 12.7 reporting broken CPAP machine and was scheduled in. Lives alone. Body weight today-181 lbs Covid vax-4 Ph[employer] Flu vax-declined CPAP auto 5-12/ [facility] Download compliance-44%, AHI 2.4/ hr. Stopped recording in November. She is having some vasomotor rhinorrhea in the mornings which I suspect reflects recent indoor heat.  She does not know how to adjust humidifier.  Says she sometimes cannot relax with CPAP.  We talked briefly about alternatives to CPAP.  We are going to replace this machine and then see how she is feeling in a couple of months.  ROS-see HPI   + = positive Constitutional:    weight loss, night sweats, fevers, chills, fatigue, lassitude. HEENT:    headaches, difficulty swallowing, tooth/dental problems, sore throat,       sneezing, itching, ear ache, nasal congestion, +post nasal drip, snoring CV:    chest pain, orthopnea, PND, swelling in lower extremities, anasarca,                                  dizziness, palpitations Resp:   shortness of breath with exertion or at rest.                productive cough,   non-productive cough, coughing up of blood.              change in color of mucus.  wheezing.   Skin:    rash or lesions. GI:   No-   heartburn, indigestion, abdominal pain, nausea, vomiting, diarrhea,                 change in bowel habits, loss of appetite GU: dysuria, change in color of urine, no urgency or frequency.   flank pain. MS:   joint pain, stiffness, decreased range of motion, back pain. Neuro-     nothing unusual Psych:  change in mood or affect.  depression or anxiety.   memory loss.'  OBJ- Physical Exam General- Alert, Oriented, Affect-appropriate, Distress- none acute Skin- rash-none, lesions- none, excoriation- none Lymphadenopathy- none Head- atraumatic            Eyes- Gross vision intact, PERRLA, conjunctivae and secretions clear            Ears- Hearing, canals-normal            Nose- Clear, no-Septal dev, mucus, polyps, erosion, perforation             Throat- Mallampati IV , mucosa clear , drainage- none, tonsils- atrophic, +teeth Neck- flexible , trachea midline, no stridor , thyroid nl, carotid no bruit Chest - symmetrical excursion , unlabored           Heart/CV- RRR , no murmur , no gallop  ,  no rub, nl s1 s2                           - JVD- none , edema- none, stasis changes- none, varices- none           Lung- clear to P&A, wheeze- none, cough- none , dullness-none, rub- none           Chest wall-  Abd-  Br/ Gen/ Rectal- Not done, not indicated Extrem- cyanosis- none, clubbing, none, atrophy- none, strength- nl Neuro-+ slight resting tremor fingers especially left hand

## 2022-10-06 ENCOUNTER — Encounter: Payer: Self-pay | Admitting: Internal Medicine

## 2022-10-06 ENCOUNTER — Ambulatory Visit: Payer: Medicare Other | Admitting: Internal Medicine

## 2022-10-06 VITALS — BP 112/64 | HR 92 | Ht 66.0 in | Wt 181.2 lb

## 2022-10-06 DIAGNOSIS — G4733 Obstructive sleep apnea (adult) (pediatric): Secondary | ICD-10-CM

## 2022-10-06 DIAGNOSIS — F32A Depression, unspecified: Secondary | ICD-10-CM | POA: Diagnosis not present

## 2022-10-06 DIAGNOSIS — F419 Anxiety disorder, unspecified: Secondary | ICD-10-CM | POA: Diagnosis not present

## 2022-10-06 NOTE — Assessment & Plan Note (Signed)
She has benefited from CPAP.  Will see if the new machine that functions better is more comfortable.  Once this is established we will look at whether a sleep medication might be helpful. Plan-replace old machine auto 5-12

## 2022-10-06 NOTE — Patient Instructions (Signed)
Order- DME [facility]- please replace old CPAP machine, auto 5-12, mask of choice, humidifier, supplies. Please install AirView/ card  Please call if we can help

## 2022-10-06 NOTE — Assessment & Plan Note (Signed)
It sounds as if there may be an insomnia component.  We will discuss this at return visit.

## 2022-10-11 ENCOUNTER — Ambulatory Visit (INDEPENDENT_AMBULATORY_CARE_PROVIDER_SITE_OTHER): Payer: Medicare Other

## 2022-10-11 VITALS — BP 122/68 | Temp 98.6°F

## 2022-10-11 DIAGNOSIS — R5383 Other fatigue: Secondary | ICD-10-CM

## 2022-10-11 DIAGNOSIS — E538 Deficiency of other specified B group vitamins: Secondary | ICD-10-CM | POA: Diagnosis not present

## 2022-10-11 MED ORDER — CYANOCOBALAMIN 1000 MCG/ML IJ SOLN
1000.0000 ug | Freq: Once | INTRAMUSCULAR | Status: AC
  Administered 2022-10-11: 1000 ug via INTRAMUSCULAR

## 2022-10-11 NOTE — Progress Notes (Signed)
   Subjective:    Patient ID: Diana George, female    DOB: 06/16/1946, 76 y.o.   MRN: [id number]  HPI  Here for nurse visit. Asking about getting B12 level checked. We checked it in July and it was WNL. Says she is fatigued. Not exercising. Diarrhea has improved. Says she is not depressed.She was offered labs at [facility]. Not sure why they were not drawn. Can certainly get these labs here at next visit if not feeling better. TSH in July was OK when rechecked.    Review of Systems     Objective:   Physical Exam  VS reviewed. BP stable      Assessment & Plan:   Fatigue B12 deficiency Hx of depression  Plan: See  at next visit to see if fatigue improving. May need labs

## 2022-11-02 ENCOUNTER — Other Ambulatory Visit: Payer: Self-pay

## 2022-11-02 MED ORDER — METFORMIN HCL 500 MG PO TABS: ORAL_TABLET | ORAL | 1 refills | Status: DC

## 2022-11-02 MED ORDER — SITAGLIPTIN PHOSPHATE 100 MG PO TABS: 100.0000 mg | ORAL_TABLET | Freq: Every day | ORAL | 0 refills | Status: DC

## 2022-11-02 NOTE — Progress Notes (Signed)
Reviewed and agree with management plans. ? ?Jeren Dufrane L. Amori Colomb, MD, MPH  ?

## 2022-11-08 ENCOUNTER — Ambulatory Visit (INDEPENDENT_AMBULATORY_CARE_PROVIDER_SITE_OTHER): Payer: Medicare Other

## 2022-11-08 VITALS — BP 112/70 | HR 75 | Temp 98.9°F

## 2022-11-08 DIAGNOSIS — E538 Deficiency of other specified B group vitamins: Secondary | ICD-10-CM

## 2022-11-08 MED ORDER — CYANOCOBALAMIN 1000 MCG/ML IJ SOLN
1000.0000 ug | Freq: Once | INTRAMUSCULAR | Status: AC
  Administered 2022-11-08: 1000 ug via INTRAMUSCULAR

## 2022-11-08 NOTE — Patient Instructions (Signed)
Monthly B12 injection given today. RTC in one month for another injection.

## 2022-11-08 NOTE — Progress Notes (Addendum)
Per orders of Elby Showers, MD, injection of B12 given in right deltoid by Pearlean Brownie, CMA Patient tolerated injection well.  1cc IM B12 injected today per CMA. RTC in one month. MJB, MD  I, Elby Showers, MD, have reviewed all documentation for this visit. The documentation on 11/08/22 for the exam, diagnosis, procedures, and orders are all accurate and complete.

## 2022-11-30 ENCOUNTER — Telehealth: Payer: Self-pay | Admitting: Physician Assistant

## 2022-11-30 NOTE — Telephone Encounter (Signed)
Pt was looking at her paperwork from previous visit and it stated she was to have an xray and labs. She had the xray but has not had the blood work done. Discussed with pt that she can come to the lab int he basement of our office building M-F between the hours of 7:30am-5pm, no appt is needed. Pt verbalized understanding.

## 2022-11-30 NOTE — Telephone Encounter (Signed)
Patient is calling states she is unclear about recent lab and x-ray results and is wanting some clarification. Please advise

## 2022-12-01 ENCOUNTER — Other Ambulatory Visit (INDEPENDENT_AMBULATORY_CARE_PROVIDER_SITE_OTHER): Payer: Self-pay

## 2022-12-01 DIAGNOSIS — R197 Diarrhea, unspecified: Secondary | ICD-10-CM

## 2022-12-01 LAB — COMPREHENSIVE METABOLIC PANEL
ALT: 15 U/L (ref 0–35)
AST: 15 U/L (ref 0–37)
Albumin: 4.3 g/dL (ref 3.5–5.2)
Alkaline Phosphatase: 87 U/L (ref 39–117)
BUN: 26 mg/dL — ABNORMAL HIGH (ref 6–23)
CO2: 27 mEq/L (ref 19–32)
Calcium: 9.2 mg/dL (ref 8.4–10.5)
Chloride: 106 mEq/L (ref 96–112)
Creatinine, Ser: 1.04 mg/dL (ref 0.40–1.20)
GFR: 52.32 mL/min — ABNORMAL LOW (ref >60.00)
Glucose, Bld: 112 mg/dL — ABNORMAL HIGH (ref 70–99)
Potassium: 3.9 mEq/L (ref 3.5–5.1)
Sodium: 141 mEq/L (ref 135–145)
Total Bilirubin: 0.6 mg/dL (ref 0.2–1.2)
Total Protein: 6.8 g/dL (ref 6.0–8.3)

## 2022-12-01 LAB — SEDIMENTATION RATE: Sed Rate: 2 mm/hr (ref 0–30)

## 2022-12-01 LAB — CBC WITH DIFFERENTIAL/PLATELET
Basophils Absolute: 0.1 10*3/uL (ref 0.0–0.1)
Basophils Relative: 0.6 % (ref 0.0–3.0)
Eosinophils Absolute: 0.5 10*3/uL (ref 0.0–0.7)
Eosinophils Relative: 5.7 % — ABNORMAL HIGH (ref 0.0–5.0)
HCT: 38.9 % (ref 36.0–46.0)
Hemoglobin: 13.1 g/dL (ref 12.0–15.0)
Lymphocytes Relative: 25.6 % (ref 12.0–46.0)
Lymphs Abs: 2.3 10*3/uL (ref 0.7–4.0)
MCHC: 33.7 g/dL (ref 30.0–36.0)
MCV: 95.6 fl (ref 78.0–100.0)
Monocytes Absolute: 0.6 10*3/uL (ref 0.1–1.0)
Monocytes Relative: 6.8 % (ref 3.0–12.0)
Neutro Abs: 5.6 10*3/uL (ref 1.4–7.7)
Neutrophils Relative %: 61.3 % (ref 43.0–77.0)
Platelets: 310 10*3/uL (ref 150.0–400.0)
RBC: 4.07 Mil/uL (ref 3.87–5.11)
RDW: 13 % (ref 11.5–15.5)
WBC: 9.2 10*3/uL (ref 4.0–10.5)

## 2022-12-01 LAB — HIGH SENSITIVITY CRP: CRP, High Sensitivity: 0.91 mg/L (ref 0.000–5.000)

## 2022-12-02 LAB — IGA: Immunoglobulin A: 147 mg/dL (ref 70–320)

## 2022-12-02 LAB — TISSUE TRANSGLUTAMINASE, IGA: (tTG) Ab, IgA: <1 U/mL

## 2022-12-05 ENCOUNTER — Telehealth: Payer: Self-pay | Admitting: Internal Medicine

## 2022-12-05 NOTE — Telephone Encounter (Signed)
Message received from [place]:  [place], [place], [place] N; [place], Ashly WE SPOKE TO THE PT TODAY, WE ARE STILL AWAITING THE AUTH FROM HER INSURANCE TO MOVE FORWARD WITH THE 'REPAP' DELIVERY.

## 2022-12-05 NOTE — Telephone Encounter (Signed)
I spoke with the pt  She is asking about CPAP  She is upset she still has not received this from [place]  She says she has spoken to [place] multiple times and still does not know what the issue is  Routing to [facility] as urgent- please help troubleshoot this?

## 2022-12-06 ENCOUNTER — Ambulatory Visit (INDEPENDENT_AMBULATORY_CARE_PROVIDER_SITE_OTHER): Payer: Medicare Other

## 2022-12-06 VITALS — BP 114/76 | HR 64 | Temp 98.1°F | Ht 66.0 in | Wt 181.0 lb

## 2022-12-06 DIAGNOSIS — E538 Deficiency of other specified B group vitamins: Secondary | ICD-10-CM

## 2022-12-06 DIAGNOSIS — Z23 Encounter for immunization: Secondary | ICD-10-CM

## 2022-12-06 MED ORDER — CYANOCOBALAMIN 1000 MCG/ML IJ SOLN
1000.0000 ug | Freq: Once | INTRAMUSCULAR | Status: AC
  Administered 2022-12-06: 1000 ug via INTRAMUSCULAR

## 2022-12-06 NOTE — Progress Notes (Signed)
Per orders of Baxley, Cresenciano Lick, MD, injection of B12 1,29mcg/ml given in  Right deltoid by Kelyn Koskela D Pennelope Basque. Patient tolerated injection well.  Lab Results  Component Value Date   [id number] 05/22/2022

## 2022-12-06 NOTE — Addendum Note (Signed)
Addended by: Latoya Diskin D on: 12/06/2022 12:17 PM   Modules accepted: Level of Service  

## 2022-12-13 ENCOUNTER — Encounter: Payer: Self-pay | Admitting: Gastroenterology

## 2022-12-13 ENCOUNTER — Ambulatory Visit (INDEPENDENT_AMBULATORY_CARE_PROVIDER_SITE_OTHER)
Admission: RE | Admit: 2022-12-13 | Discharge: 2022-12-13 | Disposition: A | Discharge status: Home / Self Care | Payer: Medicare Other | Source: Ambulatory Visit | Attending: Gastroenterology | Admitting: Gastroenterology

## 2022-12-13 ENCOUNTER — Ambulatory Visit: Payer: Medicare Other | Admitting: Gastroenterology

## 2022-12-13 VITALS — BP 114/78 | HR 67 | Ht 66.0 in | Wt 188.2 lb

## 2022-12-13 DIAGNOSIS — R197 Diarrhea, unspecified: Secondary | ICD-10-CM

## 2022-12-13 DIAGNOSIS — R152 Fecal urgency: Secondary | ICD-10-CM | POA: Diagnosis not present

## 2022-12-13 DIAGNOSIS — R159 Full incontinence of feces: Secondary | ICD-10-CM

## 2022-12-13 DIAGNOSIS — R194 Change in bowel habit: Secondary | ICD-10-CM

## 2022-12-13 DIAGNOSIS — K5900 Constipation, unspecified: Secondary | ICD-10-CM | POA: Diagnosis not present

## 2022-12-13 NOTE — Patient Instructions (Addendum)
It was a pleasure to see you today.  Please start taking Metamucil or Benefiber everyday to try to bulk up your stools. My hope is this will prevent the stools from being so many different textures.   I recommend an x-ray today. Your provider has requested that you have an abdominal x ray before leaving today. Please go to the basement floor to our Radiology department for the test.  Discuss alternatives to metformin with Dr. Renold Genta.   I am trying to get your results from Dr. Renold Genta. We may need to repeat some stool studies if they were not all done.  Based on your x-ray, I will make additional treatment recommendations. You may ultimately need a colonoscopy and an upper endoscopy.

## 2022-12-13 NOTE — Progress Notes (Signed)
Referring Provider: Elby Showers, MD Primary Care Physician:  Elby Showers, MD   Chief complaint: Diarrhea and fecal incontinence   IMPRESSION:  Diarrhea and fecal incontinence with recent change in bowel form - ? Overflow diarrhea given prior KUB findings and response to bowel purge but must also consider SIBO, microscopic colitis, infections such as Giardia and even IBD  PLAN: - Add a daily dose of Benefiber or Metamucil - Stools studies including  C. difficile, fecal calprotectin, and O&P if not done by Dr. Renold Genta (patient reports that they were submitted and that she was told they were normal although I do not see the results in EPIC) - KUB to evaluate for overflow with plans for bowel purge if needed - Consider empiric trial of Xifaxan if no stool burden seen on KUB - Colonoscopy with random biopsies to exclude microscopic colitis if KUB negative - EGD with duodenal biopsies if KUB is negative - Discuss alternatives to metformin with Dr. Renold Genta  HPI: Diana George is a 77 y.o. female who returns in follow-up after her initial consultation visit with Diana George 09/27/2022 for fecal incontinence.  This is my first visit with Diana George. She brings pages of notes that she references during her visit.  She has a history of diabetes, obstructive sleep apnea, osteopenia, hyperlipidemia, anxiety, and depression.  She started feeling bad in January and felt like her "legs were going to fall out under me."   Found to have B12 deficiency. Some improvement when she started B12.   In August she developed diarrhea with associated urgency and incontinence of liquid stool.  At its worst she was having nocturnal symptoms with a bowel movement every 1-2 hours.  She has associated urinary incontinence with urgency.  At times she will have constipation small, hard, painful bowel movements.  There is been no blood or mucus in the stool.  No identified triggers.  No change in symptoms despite her  PCP discontinuing metformin and lactulose. However, there may have been some increase in stool frequency since she resumed metformin. Trial of Metamucil x 1 dose did not change her symptoms.   CMP normal except for a BUN of 26 and a glucose of 112 CBC normal except a mild increase in eosinophils at 5.7 TTG a and IgA were normal ESR and CRP normal TSH Normal 7/23 Stool studies for C. difficile, Campylobacter, fecal leukocytes, and O&P ordered but I am unable to see the results in EPIC.  FOBT negative x 3 11/23  KUB 09/27/22 showed mildly dilated small bowel loops and a mild to moderate stool burden  Bowel purge recommended given the KUB results and she felt better after the purge.   A friend of hers who is a physician told her to try something other than metformin due to her symptoms.   Today, she has not had a bowel movement in 5 days and she is concerned that her stool is becoming more thick and stringy. No systemic complaints. No blood or mucous in the stool. No associated abdominal pain.   Endoscopic history: -Screening colonoscopy with Dr. Olevia Perches 04/09/2015 was normal   Past Medical History:  Diagnosis Date   Anxiety    B12 deficiency    Depression    Diabetes mellitus    History of coronary rotational ablation 10/30/2004   Hyperlipidemia    Renal stone    Sleep apnea    uses cpap    Past Surgical History:  Procedure Laterality Date  CATARACT EXTRACTION W/ INTRAOCULAR LENS  IMPLANT, BILATERAL  2010   heart ablation  2010   LEG SURGERY Left 2011   fracture with hardware   STONE EXTRACTION WITH BASKET  2016, [place]    Current Outpatient Medications  Medication Sig Dispense Refill   buPROPion (WELLBUTRIN XL) 300 MG 24 hr tablet Take 300 mg by mouth every morning.     Cholecalciferol (VITAMIN D) 2000 UNITS CAPS Take 1 capsule by mouth 2 (two) times daily.     clorazepate (TRANXENE) 3.75 MG tablet One tab q am and 2 tabs at bedtime 90 tablet 1    CYANOCOBALAMIN IJ Inject as directed every 30 (thirty) days.     ibandronate (BONIVA) 150 MG tablet Take 1 tablet by mouth every 30 (thirty) days.     metFORMIN (GLUCOPHAGE) 500 MG tablet TAKE 1 TABLET BY MOUTH TWICE DAILY. GENERIC EQUIVALENT FOR GLUCOPHAGE. 180 tablet 1   propranolol (INDERAL) 20 MG tablet Take 1 tablet (20 mg total) by mouth 2 (two) times daily. 60 tablet 5   sertraline (ZOLOFT) 100 MG tablet TAKE 1 TABLET BY MOUTH DAILY GENERIC EQUIVALENT FOR ZOLOFT 90 tablet 0   simvastatin (ZOCOR) 20 MG tablet TAKE 1 TABLET BY MOUTH DAILY GENERIC EQUIVALENT FOR ZOCOR 90 tablet 1   sitaGLIPtin (JANUVIA) 100 MG tablet Take 1 tablet (100 mg total) by mouth daily. 90 tablet 0   No current facility-administered medications for this visit.    Allergies as of 12/13/2022 - Review Complete 12/13/2022  Allergen Reaction Noted   Amoxicillin Rash 06/09/2011    Family History  Problem Relation Age of Onset   Alzheimer's disease Mother    Alzheimer's disease Father    Heart disease Father    Colon cancer Father 55   Tremor Father       Physical Exam: General:   Alert,  well-nourished, pleasant and cooperative in NAD Head:  Normocephalic and atraumatic. Eyes:  Sclera clear, no icterus.   Conjunctiva pink. Abdomen:  Soft, nontender, nondistended, normal bowel sounds, no rebound or guarding. No hepatosplenomegaly.   Neurologic:  Alert and  oriented x4;  grossly nonfocal Skin:  Intact without significant lesions or rashes. Psych:  Alert and cooperative. Normal mood and affect.  I spent 33 minutes, including in depth chart review, independent review of results, communicating results with the patient directly, face-to-face time with the patient, coordinating care, and ordering studies and medications as appropriate, and documentation.   Tyeshia Cornforth L. Tarri Glenn, MD, MPH 12/13/2022, 11:15 AM

## 2022-12-14 ENCOUNTER — Telehealth: Payer: Self-pay

## 2022-12-14 ENCOUNTER — Other Ambulatory Visit: Payer: Self-pay

## 2022-12-14 DIAGNOSIS — R152 Fecal urgency: Secondary | ICD-10-CM

## 2022-12-14 DIAGNOSIS — R194 Change in bowel habit: Secondary | ICD-10-CM

## 2022-12-14 DIAGNOSIS — R197 Diarrhea, unspecified: Secondary | ICD-10-CM

## 2022-12-14 NOTE — Telephone Encounter (Signed)
Placed orders for stool tests and called pt. Pt stated she picked up stool kit from Dr .Lauree Chandler office earlier today and will bring them back to that office.

## 2022-12-14 NOTE — Telephone Encounter (Signed)
Called patient to find out why she never returned stool study samples and after speaking with her she stated she doesn't remember what happened with stool samples but does remember doing the stool hemoccult cards.  I informed patient I would put together another stool study.  Patient said that she would pick up study this afternoon.

## 2022-12-14 NOTE — Telephone Encounter (Signed)
-----   Message from [place], MD sent at 12/14/2022  2:17 PM EST ----- Regarding: FW: Stool studies? [name],  Please ask this patient to submit the stool studies. (See my note from yesterday if you need more details).  She thought Dr. Renold Genta had done them - but here is the reply from her office.  Thanks.  KLB ----- Message ----- From: Abran Cantor Sent: 12/14/2022   8:45 AM EST To: Elby Showers, MD; [place], MD Subject: RE: Stool studies?                             Patient never brought the stool samples back to be sent in. She mailed back the hemoccult card and we sent it back for testing and it was negative. ----- Message ----- From: Elby Showers, MD Sent: 12/13/2022   4:33 PM EST To: Abran Cantor Subject: FW: Stool studies?                              ----- Message ----- From: [place], MD Sent: 12/13/2022  11:22 AM EST To: Elby Showers, MD Subject: Stool studies?                                 Hope you are doing well. I am seeing Diana George today. She told me that you ordered stool studies and that the results were normal. However, I am unable to locate the results in EPIC. Would you please confirm which tests were normal?  Thank you.  [place], MD, MPH [facility]

## 2022-12-14 NOTE — Telephone Encounter (Signed)
error 

## 2022-12-14 NOTE — Telephone Encounter (Signed)
Patient came into the office this afternoon to pick up stool study and was advised to use when she has loose stool and then return to the office.

## 2022-12-18 ENCOUNTER — Other Ambulatory Visit: Payer: Self-pay

## 2022-12-18 DIAGNOSIS — R194 Change in bowel habit: Secondary | ICD-10-CM

## 2022-12-18 DIAGNOSIS — K5900 Constipation, unspecified: Secondary | ICD-10-CM

## 2022-12-18 DIAGNOSIS — G250 Essential tremor: Secondary | ICD-10-CM

## 2022-12-18 MED ORDER — LINACLOTIDE 72 MCG PO CAPS: 72.0000 ug | ORAL_CAPSULE | Freq: Every day | ORAL | 0 refills | Status: DC

## 2022-12-18 MED ORDER — PROPRANOLOL HCL 20 MG PO TABS: 20.0000 mg | ORAL_TABLET | Freq: Two times a day (BID) | ORAL | 2 refills | Status: DC

## 2022-12-19 ENCOUNTER — Encounter: Payer: Self-pay | Admitting: Internal Medicine

## 2022-12-19 DIAGNOSIS — K080 Exfoliation of teeth due to systemic causes: Secondary | ICD-10-CM | POA: Diagnosis not present

## 2022-12-21 ENCOUNTER — Other Ambulatory Visit: Payer: Self-pay

## 2022-12-21 DIAGNOSIS — R197 Diarrhea, unspecified: Secondary | ICD-10-CM

## 2022-12-21 LAB — TIQ-NTM

## 2022-12-21 NOTE — Addendum Note (Signed)
Addended by: Geradine Girt D on: 12/21/2022 11:26 AM   Modules accepted: Orders

## 2022-12-25 ENCOUNTER — Encounter: Payer: Self-pay | Admitting: Internal Medicine

## 2022-12-25 ENCOUNTER — Ambulatory Visit (INDEPENDENT_AMBULATORY_CARE_PROVIDER_SITE_OTHER): Payer: Medicare Other | Admitting: Internal Medicine

## 2022-12-25 VITALS — BP 100/68 | HR 79 | Temp 98.9°F | Ht 66.0 in | Wt 185.8 lb

## 2022-12-25 DIAGNOSIS — E119 Type 2 diabetes mellitus without complications: Secondary | ICD-10-CM

## 2022-12-25 DIAGNOSIS — R197 Diarrhea, unspecified: Secondary | ICD-10-CM

## 2022-12-25 MED ORDER — GLIPIZIDE 2.5 MG PO TABS
2.5000 mg | ORAL_TABLET | Freq: Two times a day (BID) | ORAL | 0 refills | Status: DC
  Filled 2023-01-03 – 2023-02-13 (×2): qty 60, 30d supply, fill #0

## 2022-12-25 NOTE — Progress Notes (Signed)
Patient Care Team: Elby Showers, MD as PCP - General (Internal Medicine)  Visit Date: 12/25/22  Subjective:    Patient ID: Diana George , Female   DOB: 07-Oct-1946, 77 y.o.    MRN: [id number]   76 y.o. Female presents today for medication counseling. Patient has a past medical history of anxiety, B12 deficiency, depression, diabetes mellitus, coronary rotational ablation 2006, hyperlipidemia, renal stone, sleep apnea.  History of Type 2 diabetes mellitus treated with Glucophage 500 mg twice daily. Believes Glucophage is causing constipation. Interested in replacing [place] with a new medication. Has been taking Linzess 72 mcg daily before breakfast for constipation. Followed by gastroenterologist, [place]. 12/21/22 Fecal leukocytes, ova and parasite stool tests negative.   Past Medical History:  Diagnosis Date   Anxiety    B12 deficiency    Depression    Diabetes mellitus    History of coronary rotational ablation 10/30/2004   Hyperlipidemia    Renal stone    Sleep apnea    uses cpap     Family History  Problem Relation Age of Onset   Alzheimer's disease Mother    Alzheimer's disease Father    Heart disease Father    Colon cancer Father 47   Tremor Father     Social History   Social History Narrative   Not on file      Review of Systems  Constitutional:  Negative for fever and malaise/fatigue.  HENT:  Negative for congestion.   Eyes:  Negative for blurred vision.  Respiratory:  Negative for cough and shortness of breath.   Cardiovascular:  Negative for chest pain, palpitations and leg swelling.  Gastrointestinal:  Positive for constipation. Negative for vomiting.  Musculoskeletal:  Negative for back pain.  Skin:  Negative for rash.  Neurological:  Negative for loss of consciousness and headaches.        Objective:   Vitals: BP 100/68   Pulse 79   Temp 98.9 F (37.2 C) (Tympanic)   Ht '5\' 6"'$  (1.676 m)   Wt 185 lb 12.8 oz (84.3 kg)    SpO2 98%   BMI 29.99 kg/m    Physical Exam Vitals and nursing note reviewed.  Constitutional:      General: She is not in acute distress.    Appearance: Normal appearance. She is not toxic-appearing.  HENT:     Head: Normocephalic and atraumatic.  Pulmonary:     Effort: Pulmonary effort is normal.  Skin:    General: Skin is warm and dry.  Neurological:     Mental Status: She is alert and oriented to person, place, and time. Mental status is at baseline.  Psychiatric:        Mood and Affect: Mood normal.        Behavior: Behavior normal.        Thought Content: Thought content normal.        Judgment: Judgment normal.       Results:   Studies obtained and personally reviewed by me:   Labs:       Component Value Date/Time   NA 141 12/01/2022 1316   K 3.9 12/01/2022 1316   CL 106 12/01/2022 1316   CO2 27 12/01/2022 1316   GLUCOSE 112 (H) 12/01/2022 1316   BUN 26 (H) 12/01/2022 1316   CREATININE 1.04 12/01/2022 1316   CREATININE 1.19 (H) 05/29/2022 0937   CALCIUM 9.2 12/01/2022 1316   PROT 6.8 12/01/2022 1316   ALBUMIN 4.3 12/01/2022  1316   AST 15 12/01/2022 1316   ALT 15 12/01/2022 1316   ALKPHOS 87 12/01/2022 1316   BILITOT 0.6 12/01/2022 1316   GFRNONAA 59 (L) 05/14/2020 0939   GFRAA 68 05/14/2020 0939     Lab Results  Component Value Date   WBC 9.2 12/01/2022   HGB 13.1 12/01/2022   HCT 38.9 12/01/2022   MCV 95.6 12/01/2022   PLT 310.0 12/01/2022    Lab Results  Component Value Date   CHOL 142 05/19/2022   HDL 56 05/19/2022   LDLCALC 69 05/19/2022   TRIG 85 05/19/2022   CHOLHDL 2.5 05/19/2022    Lab Results  Component Value Date   HGBA1C 5.9 (H) 05/19/2022     Lab Results  Component Value Date   TSH 2.41 05/22/2022      Assessment & Plan:   Type 2 Diabetes Mellitus: Stop metformin and Linzess. Prescribed Glipizide 2.5 mg before breakfast and before dinner. Check blood glucose before breakfast, at lunch, before supper, after supper.  Call with results in a few days  Diarrhea- being seen by Dr. Tarri Glenn. Patient wonders if diarrhea is being caused by Metformin. She likely does not need Linzess with diarrhea so it can be stopped as well. Stool studies in progress.  I,Alexander Ruley,acting as a Education administrator for Elby Showers, MD.,have documented all relevant documentation on the behalf of Elby Showers, MD,as directed by  Elby Showers, MD while in the presence of Elby Showers, MD.   I, Elby Showers, MD, have reviewed all documentation for this visit. The documentation on 12/25/22 for the exam, diagnosis, procedures, and orders are all accurate and complete.

## 2022-12-25 NOTE — Patient Instructions (Addendum)
Discontinue metformin since she been having issues with diarrhea.    Try glipizide 2.5 mg before breakfast and before supper.  Check Accu-Cheks 3 times daily and call with results in a few days.  Patient wonders if diarrhea is being aggravated by metformin and/or Linzess.  Linzess is to be discontinued.  Has recently seen Dr. Tarri Glenn for diarrhea. Stool studies in progress

## 2022-12-26 LAB — SALMONELLA/SHIGELLA CULT, CAMPY EIA AND SHIGA TOXIN RFL ECOLI
MICRO NUMBER: 14601060
MICRO NUMBER:: 14601061
MICRO NUMBER:: 14601064
Result:: NOT DETECTED
SHIGA RESULT:: NOT DETECTED
SPECIMEN QUALITY: ADEQUATE
SPECIMEN QUALITY:: ADEQUATE
SPECIMEN QUALITY:: ADEQUATE

## 2022-12-26 LAB — OVA AND PARASITE EXAMINATION
CONCENTRATE RESULT:: NONE SEEN
MICRO NUMBER:: 14601063
SPECIMEN QUALITY:: ADEQUATE
TRICHROME RESULT:: NONE SEEN

## 2022-12-26 LAB — FECAL LEUKOCYTES
Micro Number: 14601062
Result: NOT DETECTED
Specimen Quality: ADEQUATE

## 2022-12-26 LAB — CLOSTRIDIUM DIFFICILE CULTURE-FECAL

## 2022-12-26 NOTE — Telephone Encounter (Signed)
Update from adapt   [place], Eleanora Neighbor, [place]; [place], [place]; Irene Limbo We are currently waiting on prior authorization from insurance company to come back. Once it has been received the re-pap department will ship out the preset machine to the patient. They will be calling her with a tracking number.

## 2023-01-03 ENCOUNTER — Other Ambulatory Visit (HOSPITAL_COMMUNITY): Payer: Self-pay

## 2023-01-03 ENCOUNTER — Ambulatory Visit (INDEPENDENT_AMBULATORY_CARE_PROVIDER_SITE_OTHER): Payer: Medicare Other

## 2023-01-03 ENCOUNTER — Other Ambulatory Visit: Payer: Self-pay

## 2023-01-03 ENCOUNTER — Other Ambulatory Visit (HOSPITAL_BASED_OUTPATIENT_CLINIC_OR_DEPARTMENT_OTHER): Payer: Self-pay

## 2023-01-03 VITALS — BP 120/76 | HR 61 | Temp 98.4°F | Ht 66.0 in | Wt 185.0 lb

## 2023-01-03 DIAGNOSIS — E538 Deficiency of other specified B group vitamins: Secondary | ICD-10-CM | POA: Diagnosis not present

## 2023-01-03 MED ORDER — CYANOCOBALAMIN 1000 MCG/ML IJ SOLN
1000.0000 ug | Freq: Once | INTRAMUSCULAR | Status: AC
  Administered 2023-01-03: 1000 ug via INTRAMUSCULAR

## 2023-01-03 MED ORDER — GLIPIZIDE 5 MG PO TABS
2.5000 mg | ORAL_TABLET | Freq: Two times a day (BID) | ORAL | 0 refills | Status: DC
  Filled 2023-01-03: qty 30, 30d supply, fill #0

## 2023-01-03 NOTE — Progress Notes (Signed)
Per orders of Baxley, Cresenciano Lick, MD, injection of B12 given in   Right deltoid by Andjela Wickes D Baya Lentz. Patient tolerated injection well. Vital signs within normal limits  Lab Results  Component Value Date   VITAMINB12 328 05/22/2022

## 2023-01-04 DIAGNOSIS — G4733 Obstructive sleep apnea (adult) (pediatric): Secondary | ICD-10-CM | POA: Diagnosis not present

## 2023-01-05 ENCOUNTER — Other Ambulatory Visit (HOSPITAL_BASED_OUTPATIENT_CLINIC_OR_DEPARTMENT_OTHER): Payer: Self-pay

## 2023-01-15 ENCOUNTER — Other Ambulatory Visit: Payer: Self-pay | Admitting: Gastroenterology

## 2023-01-15 DIAGNOSIS — F3341 Major depressive disorder, recurrent, in partial remission: Secondary | ICD-10-CM | POA: Diagnosis not present

## 2023-01-15 DIAGNOSIS — K5900 Constipation, unspecified: Secondary | ICD-10-CM

## 2023-01-15 DIAGNOSIS — R194 Change in bowel habit: Secondary | ICD-10-CM

## 2023-01-18 ENCOUNTER — Telehealth: Payer: Self-pay | Admitting: Internal Medicine

## 2023-01-18 DIAGNOSIS — G4733 Obstructive sleep apnea (adult) (pediatric): Secondary | ICD-10-CM

## 2023-01-18 NOTE — Telephone Encounter (Signed)
Called and spoke to patient and she states that she is just now getting her cpap machine after 14 weeks. She states her machine is missing parts. She states she has tried to call them but she is unable to reach them. I told her I will place an order to get her machine set up and to get her, her replacing parts. Nothing further needed.

## 2023-01-18 NOTE — Telephone Encounter (Signed)
PT wants to speak to Dr. Orland Penman nurse about [employer] and the substandard service. She waited 14 weeks for her new Cpap machine has so many notes from people she spoke to. They rate a 1.4 in their reviews.  Pls call PT @ [phone]

## 2023-02-01 ENCOUNTER — Encounter: Payer: Self-pay | Admitting: Physician Assistant

## 2023-02-01 ENCOUNTER — Telehealth: Payer: Self-pay | Admitting: Physician Assistant

## 2023-02-01 ENCOUNTER — Ambulatory Visit: Payer: Medicare Other | Admitting: Physician Assistant

## 2023-02-01 VITALS — BP 120/68 | HR 68 | Ht 66.0 in | Wt 190.0 lb

## 2023-02-01 DIAGNOSIS — R197 Diarrhea, unspecified: Secondary | ICD-10-CM | POA: Diagnosis not present

## 2023-02-01 DIAGNOSIS — K5909 Other constipation: Secondary | ICD-10-CM | POA: Diagnosis not present

## 2023-02-01 DIAGNOSIS — R194 Change in bowel habit: Secondary | ICD-10-CM

## 2023-02-01 MED ORDER — LINACLOTIDE 72 MCG PO CAPS: 72.0000 ug | ORAL_CAPSULE | Freq: Every day | ORAL | 3 refills | Status: DC

## 2023-02-01 NOTE — Telephone Encounter (Signed)
I have spoken to the pt and she states she has had diarrhea since 6 am.  I have advised that she come in for appt today if possible to get a diagnosis and treatment.  She has been advised that there are bathrooms in the lobby and at the elevator on the 3 rd floor.  She will bring an extra set of clothes and wear a pad or diaper if available.

## 2023-02-01 NOTE — Progress Notes (Signed)
Chief Complaint: Diarrhea/overflow constipation  HPI:    Diana George is a 77 year old female with a past medical history as listed below including diabetes, known to Dr. Tarri Glenn, who returns to clinic today for a complaint of diarrhea versus overflow constipation.    12/13/2022 office visit with Dr. Tarri Glenn and at that time discussed diarrhea and fecal incontinence with a recent change in bowel form.  There is question overflow diarrhea given prior KUB findings and response to bowel purge.  At that time recommended a fiber supplement and KUB.  If KUB showed no stool burden then empiric trial of Xifaxan +/- colonoscopy and EGD.    12/16/2022 KUB with moderate colonic stool and a nonspecific bowel gas pattern.  At that time patient continued on fiber and recommended a low-dose of Linzess 72 mcg daily.    12/25/2022 patient went to see PCP and that time noted that stool studies including fecal leukocytes, ova and parasites were negative.  She was told that she should not use Linzess with diarrhea.    Today, the patient tells me that she has been using the Linzess 72 mcg daily since being seen by Dr. Tarri Glenn over the past month and a half and things had actually been doing slightly better.  She was having a daily bowel movement and had not really had much diarrhea.  Then she did not have a bowel movement for the past 2 days on 01/30/2023 and 01/31/2023 and this morning woke up and had about 7 bowel movements between 6 and 7:30 AM, at first it was soft solid like her normal have been but then she had a lot of loose stool and then it occurred every 10 minutes for a while but stopped after about an hour and a half or 2.  Really when she was having consistent stools this was not a problem, but acutely it was.  She was still using her Metamucil and her Linzess daily.  Denies any acute change in diet, hydration or activity level.  No change in medications.    Denies fever, chills, weight loss or blood in her  stool.  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     Cholecalciferol (VITAMIN D) 2000 UNITS CAPS Take 1 capsule by mouth 2 (two) times daily.     clorazepate (TRANXENE) 3.75 MG tablet One tab q am and 2 tabs at bedtime 90 tablet 1   CYANOCOBALAMIN IJ Inject as directed every 30 (thirty) days.     glipiZIDE (GLUCOTROL) 5 MG tablet Take 0.5 tablets (2.5 mg total) by mouth 2 (two) times daily. 30 tablet 0   glipiZIDE 2.5 MG TABS Take 2.5 mg by mouth 2 (two) times daily. Take before breakfast and supper 60 tablet 0   linaclotide (LINZESS) 72  MCG capsule TAKE 1 CAPSULE(72 MCG) BY MOUTH DAILY BEFORE BREAKFAST 30 capsule 3   metFORMIN (GLUCOPHAGE) 500 MG tablet TAKE 1 TABLET BY MOUTH TWICE DAILY. GENERIC EQUIVALENT FOR GLUCOPHAGE. 180 tablet 1   propranolol (INDERAL) 20 MG tablet Take 1 tablet (20 mg total) by mouth 2 (two) times daily. 60 tablet 2   sertraline (ZOLOFT) 100 MG tablet TAKE 1 TABLET BY MOUTH DAILY GENERIC EQUIVALENT FOR ZOLOFT 90 tablet 0   simvastatin (ZOCOR) 20 MG tablet TAKE 1 TABLET BY MOUTH DAILY GENERIC EQUIVALENT FOR ZOCOR 90 tablet 1   sitaGLIPtin (JANUVIA) 100 MG tablet Take 1 tablet (100 mg total) by mouth daily. 90 tablet 0   hyoscyamine (LEVSIN) 0.125 MG tablet Take 0.125 mg by mouth as needed. (Patient not taking: Reported on 02/01/2023)     ibandronate (BONIVA) 150 MG tablet Take 1  tablet by mouth every 30 (thirty) days. (Patient not taking: Reported on 12/25/2022)     No current facility-administered medications for this visit.    Allergies as of 02/01/2023 - Review Complete 02/01/2023  Allergen Reaction Noted   Amoxicillin Rash 06/09/2011    Family History  Problem Relation Age of Onset   Alzheimer's disease Mother    Alzheimer's disease Father    Heart disease Father    Colon cancer Father 41   Tremor Father     Social History   Socioeconomic History   Marital status: Divorced    Spouse name: Not on file   Number of children: Not on file   Years of education: Not on file   Highest education level: Not on file  Occupational History   Occupation: retired  Tobacco Use   Smoking status: Never   Smokeless tobacco: Never  Vaping Use   Vaping Use: Never used  Substance and Sexual Activity   Alcohol use: No    Alcohol/week: 0.0 standard drinks of alcohol   Drug use: No   Sexual activity: Not on file  Other Topics Concern   Not on file  Social History Narrative   Not on file   Social Determinants of Health   Financial Resource Strain: Not on file  Food Insecurity: Not on file  Transportation Needs: Not on file  Physical Activity: Not on file  Stress: Not on file  Social Connections: Not on file  Intimate Partner Violence: Not on file    Review of Systems:    Constitutional: No weight loss, fever or chills Cardiovascular: No chest pain Respiratory: No SOB  Gastrointestinal: See HPI and otherwise negative   Physical Exam:  Vital signs: BP 120/68   Pulse 68   Ht 5\' 6"  (1.676 m)   Wt 190 lb (86.2 kg)   BMI 30.67 kg/m    Constitutional:   Pleasant Elderly Caucasian female appears to be in NAD, Well developed, Well nourished, alert and cooperative Respiratory: Respirations even and unlabored. Lungs clear to auscultation bilaterally.   No wheezes, crackles, or rhonchi.  Cardiovascular: Normal S1, S2. No MRG. Regular rate and rhythm. No  peripheral edema, cyanosis or pallor.  Gastrointestinal:  Soft, nondistended, nontender. No rebound or guarding. Normal bowel sounds. No appreciable masses or hepatomegaly. Rectal:  Not performed.  Psychiatric: Oriented to person, place and time. Demonstrates good judgement and reason without abnormal affect or behaviors.  RELEVANT LABS AND IMAGING: CBC    Component Value Date/Time   WBC 9.2 12/01/2022 1316   RBC 4.07 12/01/2022 1316   HGB 13.1 12/01/2022 1316   HCT 38.9 12/01/2022  1316   PLT 310.0 12/01/2022 1316   MCV 95.6 12/01/2022 1316   MCH 32.5 05/19/2022 0910   MCHC 33.7 12/01/2022 1316   RDW 13.0 12/01/2022 1316   LYMPHSABS 2.3 12/01/2022 1316   MONOABS 0.6 12/01/2022 1316   EOSABS 0.5 12/01/2022 1316   BASOSABS 0.1 12/01/2022 1316    CMP     Component Value Date/Time   NA 141 12/01/2022 1316   K 3.9 12/01/2022 1316   CL 106 12/01/2022 1316   CO2 27 12/01/2022 1316   GLUCOSE 112 (H) 12/01/2022 1316   BUN 26 (H) 12/01/2022 1316   CREATININE 1.04 12/01/2022 1316   CREATININE 1.19 (H) 05/29/2022 0937   CALCIUM 9.2 12/01/2022 1316   PROT 6.8 12/01/2022 1316   ALBUMIN 4.3 12/01/2022 1316   AST 15 12/01/2022 1316   ALT 15 12/01/2022 1316   ALKPHOS 87 12/01/2022 1316   BILITOT 0.6 12/01/2022 1316   GFRNONAA 59 (L) 05/14/2020 0939   GFRAA 68 05/14/2020 0939    Assessment: 1.  Overflow constipation: Patient stool studies are negative, her symptoms were better when she was passing daily stools with the addition of Linzess, recommend she continue this  Plan: 1.  Discussed with patient continue Linzess 72 mcg daily and her daily fiber supplement.  With this medication and fiber she is having a bowel movement soft solid every day.  If she starts a day without a bowel movement then she should add a dose of MiraLAX if she does not have a bowel movement by suppertime she should take another dose and continue that until she has one and then back off of the MiraLAX.   Discussed this in detail with her. 2.  Hopefully we can get her back on track with a good bowel regimen, I think for the most part her Linzess works well for her, but if in the future there is question then may need to consider EGD and colonoscopy as per discussion with Dr. Tarri Glenn. 3.  Refilled Linzess 72 mcg daily, 30 minutes before breakfast #90 with 3 refills. 4.  Patient follow in clinic with me in 6 to 8 weeks or sooner if necessary.  Ellouise Newer, PA-C [facility] 02/01/2023, 10:11 AM  Cc: Elby Showers, MD

## 2023-02-01 NOTE — Telephone Encounter (Signed)
Patient was last seen by Dr. Tarri Glenn.

## 2023-02-01 NOTE — Telephone Encounter (Signed)
PT  has an appointment today at 10am and has had intense diarrhea since 6am and is worried she's gonna make a mess. She is requesting a call bck on if she should come to appointment.

## 2023-02-01 NOTE — Patient Instructions (Signed)
We have sent the following medications to your pharmacy for you to pick up at your convenience: Linzess 72 mcg daily before breakfast.   Start Miralax 1 capful twice daily in 8 ounces of liquid if a day without a bowel movement.  _______________________________________________________  If your blood pressure at your visit was 140/90 or greater, please contact your primary care physician to follow up on this.  _______________________________________________________  If you are age 77 or older, your body mass index should be between 23-30. Your Body mass index is 30.67 kg/m. If this is out of the aforementioned range listed, please consider follow up with your Primary Care Provider.  If you are age 83 or younger, your body mass index should be between 19-25. Your Body mass index is 30.67 kg/m. If this is out of the aformentioned range listed, please consider follow up with your Primary Care Provider.   ________________________________________________________  The [facility] providers would like to encourage you to use [facility] to communicate with providers for non-urgent requests or questions.  Due to long hold times on the telephone, sending your provider a message by [facility] may be a faster and more efficient way to get a response.  Please allow 48 business hours for a response.  Please remember that this is for non-urgent requests.  _______________________________________________________

## 2023-02-02 ENCOUNTER — Telehealth: Payer: Self-pay | Admitting: Physician Assistant

## 2023-02-02 NOTE — Telephone Encounter (Signed)
Inbound call from patient, is wishing to speak to a nurse in regards to excessive diarrhea. States after her visit, it has become severe and would like to discuss any methods to relief. Please advise

## 2023-02-02 NOTE — Telephone Encounter (Signed)
Returned call to patient. Pt states that she was having loose stools yesterday and she feels like she is having even more today. I asked patient if she had taken her fiber today and I recommend that she go ahead and take fiber supplement and ensure she is doing this daily. Pt states that she has not been able to pick up Linzess RX because the pharmacy is out of stock. Pt is supposed to call the pharmacy back to see if they received a shipment or not. If not, pt will call back to have RX sent to a different pharmacy. Pt verbalized understanding of recommendations and had no other concerns at the end of the call.

## 2023-02-04 DIAGNOSIS — G4733 Obstructive sleep apnea (adult) (pediatric): Secondary | ICD-10-CM | POA: Diagnosis not present

## 2023-02-07 ENCOUNTER — Ambulatory Visit (INDEPENDENT_AMBULATORY_CARE_PROVIDER_SITE_OTHER): Payer: Medicare Other

## 2023-02-07 VITALS — BP 120/80 | HR 65 | Temp 97.8°F | Ht 66.0 in | Wt 190.0 lb

## 2023-02-07 DIAGNOSIS — E538 Deficiency of other specified B group vitamins: Secondary | ICD-10-CM | POA: Diagnosis not present

## 2023-02-07 MED ORDER — CYANOCOBALAMIN 1000 MCG/ML IJ SOLN
1000.0000 ug | Freq: Once | INTRAMUSCULAR | Status: AC
Start: 2023-02-07 — End: 2023-02-07
  Administered 2023-02-07: 1000 ug via INTRAMUSCULAR

## 2023-02-07 NOTE — Progress Notes (Signed)
Reviewed and agree with management plans. ? ?Kenlie Seki L. Ardie Dragoo, MD, MPH  ?

## 2023-02-07 NOTE — Progress Notes (Signed)
Per orders of Margaree Mackintosh, MD, injection of B12 given in Left deltoid by Kandy Towery D Edelmiro Innocent. Patient tolerated injection well.  Vital signs within normal limits.  Lab Results  Component Value Date   VITAMINB12 328 05/22/2022

## 2023-02-10 ENCOUNTER — Other Ambulatory Visit: Payer: Self-pay

## 2023-02-10 ENCOUNTER — Emergency Department (HOSPITAL_COMMUNITY)
Admission: EM | Admit: 2023-02-10 | Discharge: 2023-02-10 | Disposition: A | Discharge status: Home / Self Care | Payer: Medicare Other | Attending: Emergency Medicine | Admitting: Emergency Medicine

## 2023-02-10 ENCOUNTER — Emergency Department (HOSPITAL_COMMUNITY): Payer: Medicare Other

## 2023-02-10 DIAGNOSIS — I6782 Cerebral ischemia: Secondary | ICD-10-CM | POA: Insufficient documentation

## 2023-02-10 DIAGNOSIS — R531 Weakness: Secondary | ICD-10-CM | POA: Diagnosis not present

## 2023-02-10 DIAGNOSIS — R410 Disorientation, unspecified: Secondary | ICD-10-CM | POA: Diagnosis not present

## 2023-02-10 DIAGNOSIS — W19XXXA Unspecified fall, initial encounter: Secondary | ICD-10-CM | POA: Insufficient documentation

## 2023-02-10 DIAGNOSIS — R197 Diarrhea, unspecified: Secondary | ICD-10-CM | POA: Insufficient documentation

## 2023-02-10 DIAGNOSIS — E860 Dehydration: Secondary | ICD-10-CM | POA: Diagnosis not present

## 2023-02-10 DIAGNOSIS — R4182 Altered mental status, unspecified: Secondary | ICD-10-CM | POA: Insufficient documentation

## 2023-02-10 DIAGNOSIS — F32A Depression, unspecified: Secondary | ICD-10-CM | POA: Insufficient documentation

## 2023-02-10 DIAGNOSIS — R350 Frequency of micturition: Secondary | ICD-10-CM | POA: Insufficient documentation

## 2023-02-10 DIAGNOSIS — S0990XA Unspecified injury of head, initial encounter: Secondary | ICD-10-CM | POA: Insufficient documentation

## 2023-02-10 DIAGNOSIS — Z7984 Long term (current) use of oral hypoglycemic drugs: Secondary | ICD-10-CM | POA: Diagnosis not present

## 2023-02-10 DIAGNOSIS — E119 Type 2 diabetes mellitus without complications: Secondary | ICD-10-CM | POA: Insufficient documentation

## 2023-02-10 DIAGNOSIS — E785 Hyperlipidemia, unspecified: Secondary | ICD-10-CM | POA: Diagnosis not present

## 2023-02-10 LAB — COMPREHENSIVE METABOLIC PANEL
ALT: 21 U/L (ref 0–44)
AST: 24 U/L (ref 15–41)
Albumin: 3.7 g/dL (ref 3.5–5.0)
Alkaline Phosphatase: 79 U/L (ref 38–126)
Anion gap: 11 (ref 5–15)
BUN: 25 mg/dL — ABNORMAL HIGH (ref 8–23)
CO2: 23 mmol/L (ref 22–32)
Calcium: 9.2 mg/dL (ref 8.9–10.3)
Chloride: 105 mmol/L (ref 98–111)
Creatinine, Ser: 1.55 mg/dL — ABNORMAL HIGH (ref 0.44–1.00)
GFR, Estimated: 35 mL/min — ABNORMAL LOW (ref >60)
Glucose, Bld: 174 mg/dL — ABNORMAL HIGH (ref 70–99)
Potassium: 4.3 mmol/L (ref 3.5–5.1)
Sodium: 139 mmol/L (ref 135–145)
Total Bilirubin: 0.9 mg/dL (ref 0.3–1.2)
Total Protein: 6.5 g/dL (ref 6.5–8.1)

## 2023-02-10 LAB — URINALYSIS, ROUTINE W REFLEX MICROSCOPIC
Bilirubin Urine: NEGATIVE
Glucose, UA: NEGATIVE mg/dL
Hgb urine dipstick: NEGATIVE
Ketones, ur: NEGATIVE mg/dL
Leukocytes,Ua: NEGATIVE
Nitrite: NEGATIVE
Protein, ur: NEGATIVE mg/dL
Specific Gravity, Urine: 1.019 (ref 1.005–1.030)
pH: 5 (ref 5.0–8.0)

## 2023-02-10 LAB — CBC
HCT: 41.3 % (ref 36.0–46.0)
Hemoglobin: 13.2 g/dL (ref 12.0–15.0)
MCH: 31.7 pg (ref 26.0–34.0)
MCHC: 32 g/dL (ref 30.0–36.0)
MCV: 99.3 fL (ref 80.0–100.0)
Platelets: 325 10*3/uL (ref 150–400)
RBC: 4.16 MIL/uL (ref 3.87–5.11)
RDW: 12.8 % (ref 11.5–15.5)
WBC: 7.1 10*3/uL (ref 4.0–10.5)
nRBC: 0 % (ref 0.0–0.2)

## 2023-02-10 LAB — TSH: TSH: 2.69 u[IU]/mL (ref 0.350–4.500)

## 2023-02-10 LAB — PROTIME-INR
INR: 1.2 (ref 0.8–1.2)
Prothrombin Time: 14.9 seconds (ref 11.4–15.2)

## 2023-02-10 LAB — AMMONIA: Ammonia: 20 umol/L (ref 9–35)

## 2023-02-10 LAB — CBG MONITORING, ED: Glucose-Capillary: 166 mg/dL — ABNORMAL HIGH (ref 70–99)

## 2023-02-10 MED ORDER — LACTATED RINGERS IV BOLUS
1000.0000 mL | Freq: Once | INTRAVENOUS | Status: AC
  Administered 2023-02-10: 1000 mL via INTRAVENOUS

## 2023-02-10 NOTE — ED Provider Notes (Signed)
[place] EMERGENCY DEPARTMENT AT [facility] Provider Note  CSN: [id number] Arrival date & time: 02/10/23 1141  Chief Complaint(s) Fall, Altered Mental Status, and Diarrhea  HPI Diana George is a 77 y.o. female with history of depression, diabetes, hyperlipidemia to the emergency department with episode of confusion.  Patient's son reports that today she was talking to her on the phone and she was saying things that did not make sense like her grandson was with her.  The patient remembers this, does not have an explanation for it.  She reports that she has had some generalized weakness lately and been dealing with diarrhea for a few months, but no changes with these issues recently.  She has had some increased urinary frequency but otherwise no fevers or chills, no headaches, no nausea or vomiting, no abdominal pain, no back pain, no neck pain or neck stiffness, no rashes.  Symptoms are mild.  Patient reports she feels back to baseline now.  Son agrees  Patient did apparently hit her head yesterday, felt weak and had a fall.  She denies any neck pain.   Past Medical History Past Medical History:  Diagnosis Date   Anxiety    B12 deficiency    Depression    Diabetes mellitus    History of coronary rotational ablation 10/30/2004   Hyperlipidemia    Renal stone    Sleep apnea    uses cpap   Patient Active Problem List   Diagnosis Date Noted   Diarrhea 12/25/2022   OSA (obstructive sleep apnea) 04/13/2014   Allergic rhinitis 09/29/2012   Situational stress 09/29/2012   Hyperlipidemia 06/16/2011   Diabetes mellitus 06/16/2011   Anxiety and depression 06/16/2011   Home Medication(s) Prior to Admission medications   Medication Sig Start Date End Date Taking? Authorizing Provider  buPROPion (WELLBUTRIN XL) 300 MG 24 hr tablet Take 300 mg by mouth every morning. 05/05/22   [provider]  Cholecalciferol (VITAMIN D) 2000 UNITS CAPS Take 1 capsule by mouth 2 (two)  times daily.    [provider]  clorazepate (TRANXENE) 3.75 MG tablet One tab q am and 2 tabs at bedtime 05/22/22   Margaree Mackintosh, MD  CYANOCOBALAMIN IJ Inject as directed every 30 (thirty) days.    [provider]  glipiZIDE (GLUCOTROL) 5 MG tablet Take 0.5 tablets (2.5 mg total) by mouth 2 (two) times daily. 12/25/22     glipiZIDE 2.5 MG TABS Take 2.5 mg by mouth 2 (two) times daily. Take before breakfast and supper 12/25/22   Margaree Mackintosh, MD  hyoscyamine (LEVSIN) 0.125 MG tablet Take 0.125 mg by mouth as needed. Patient not taking: Reported on 02/01/2023    [provider]  ibandronate (BONIVA) 150 MG tablet Take 1 tablet by mouth every 30 (thirty) days. Patient not taking: Reported on 12/25/2022    [provider]  linaclotide Karlene Einstein) 72 MCG capsule Take 1 capsule (72 mcg total) by mouth daily before breakfast. 02/01/23   Unk Lightning, PA  metFORMIN (GLUCOPHAGE) 500 MG tablet TAKE 1 TABLET BY MOUTH TWICE DAILY. GENERIC EQUIVALENT FOR GLUCOPHAGE. 11/02/22   Margaree Mackintosh, MD  propranolol (INDERAL) 20 MG tablet Take 1 tablet (20 mg total) by mouth 2 (two) times daily. 12/18/22   Ihor Austin, NP  sertraline (ZOLOFT) 100 MG tablet TAKE 1 TABLET BY MOUTH DAILY GENERIC EQUIVALENT FOR ZOLOFT 10/18/20   Margaree Mackintosh, MD  simvastatin (ZOCOR) 20 MG tablet TAKE 1 TABLET BY  MOUTH DAILY GENERIC EQUIVALENT FOR ZOCOR 10/02/22   Margaree Mackintosh, MD  sitaGLIPtin (JANUVIA) 100 MG tablet Take 1 tablet (100 mg total) by mouth daily. 11/02/22   Margaree Mackintosh, MD                                                                                                                                    Past Surgical History Past Surgical History:  Procedure Laterality Date   CATARACT EXTRACTION W/ INTRAOCULAR LENS  IMPLANT, BILATERAL  2010   heart ablation  2010   LEG SURGERY Left 2011   fracture with hardware   STONE EXTRACTION WITH BASKET  2016, 1977   TUBAL LIGATION  1977    Family History Family History  Problem Relation Age of Onset   Alzheimer's disease Mother    Alzheimer's disease Father    Heart disease Father    Colon cancer Father 79   Tremor Father     Social History Social History   Tobacco Use   Smoking status: Never   Smokeless tobacco: Never  Vaping Use   Vaping Use: Never used  Substance Use Topics   Alcohol use: No    Alcohol/week: 0.0 standard drinks of alcohol   Drug use: No   Allergies Amoxicillin  Review of Systems Review of Systems  All other systems reviewed and are negative.   Physical Exam Vital Signs  I have reviewed the triage vital signs BP 117/66   Pulse 68   Temp 97.6 F (36.4 C)   Resp 14   SpO2 96%  Physical Exam Vitals and nursing note reviewed.  Constitutional:      General: She is not in acute distress.    Appearance: She is well-developed.  HENT:     Head: Normocephalic and atraumatic.     Mouth/Throat:     Mouth: Mucous membranes are moist.  Eyes:     Pupils: Pupils are equal, round, and reactive to light.  Cardiovascular:     Rate and Rhythm: Normal rate and regular rhythm.     Heart sounds: No murmur heard. Pulmonary:     Effort: Pulmonary effort is normal. No respiratory distress.     Breath sounds: Normal breath sounds.  Abdominal:     General: Abdomen is flat.     Palpations: Abdomen is soft.     Tenderness: There is no abdominal tenderness.  Musculoskeletal:        General: No tenderness.     Right lower leg: No edema.     Left lower leg: No edema.  Skin:    General: Skin is warm and dry.  Neurological:     General: No focal deficit present.     Mental Status: She is alert and oriented to person, place, and time. Mental status is at baseline.     Comments: Cranial nerves II through XII intact, strength 5 out of 5 in  the bilateral upper and lower extremities, no sensory deficit to light touch, no dysmetria on finger-nose-finger testing   Psychiatric:        Mood and  Affect: Mood normal.        Behavior: Behavior normal.     ED Results and Treatments Labs (all labs ordered are listed, but only abnormal results are displayed) Labs Reviewed  COMPREHENSIVE METABOLIC PANEL - Abnormal; Notable for the following components:      Result Value   Glucose, Bld 174 (*)    BUN 25 (*)    Creatinine, Ser 1.55 (*)    GFR, Estimated 35 (*)    All other components within normal limits  CBG MONITORING, ED - Abnormal; Notable for the following components:   Glucose-Capillary 166 (*)    All other components within normal limits  CBC  TSH  AMMONIA  URINALYSIS, ROUTINE W REFLEX MICROSCOPIC  PROTIME-INR                                                                                                                          Radiology CT Head Wo Contrast  Result Date: 02/10/2023 CLINICAL DATA:  Head trauma EXAM: CT HEAD WITHOUT CONTRAST TECHNIQUE: Contiguous axial images were obtained from the base of the skull through the vertex without intravenous contrast. RADIATION DOSE REDUCTION: This exam was performed according to the departmental dose-optimization program which includes automated exposure control, adjustment of the mA and/or kV according to patient size and/or use of iterative reconstruction technique. COMPARISON:  Head CT dated 11/03/2021. FINDINGS: Brain: Mild chronic small vessel ischemic changes within the bilateral periventricular and subcortical white matter regions. No mass, hemorrhage, edema or other evidence of acute parenchymal abnormality. No extra-axial hemorrhage. Vascular: Chronic calcified atherosclerotic changes of the large vessels at the skull base. No unexpected hyperdense vessel. Skull: Normal. Negative for fracture or focal lesion. Sinuses/Orbits: No acute finding. Other: None. IMPRESSION: 1. No acute findings. No intracranial mass, hemorrhage or edema. No skull fracture. 2. Mild chronic small vessel ischemic changes in the white matter.  Electronically Signed   By: Bary Richard M.D.   On: 02/10/2023 13:59   DG Chest Port 1 View  Result Date: 02/10/2023 CLINICAL DATA:  Confusion EXAM: PORTABLE CHEST 1 VIEW COMPARISON:  None Available. FINDINGS: The heart size and mediastinal contours are within normal limits. Both lungs are clear. The visualized skeletal structures are unremarkable. Aortic atherosclerotic vascular calcifications. IMPRESSION: No active disease. Electronically Signed   By: Malachy Moan M.D.   On: 02/10/2023 13:26    Pertinent labs & imaging results that were available during my care of the patient were reviewed by me and considered in my medical decision making (see MDM for details).  Medications Ordered in ED Medications  lactated ringers bolus 1,000 mL (1,000 mLs Intravenous New Bag/Given 02/10/23 1441)  Procedures Procedures  (including critical care time)  Medical Decision Making / ED Course   MDM:  77 year old female presenting to the emergency department with episode of confusion.  Patient well-appearing, physical exam is reassuring, no focal abnormality, normal neurologic exam.  Unclear episode of confusion earlier, could be related to dehydration as creatinine is elevated.  Patient received IV fluid bolus and feels better.  In the emergency department she is alert and oriented x 3 does not seem confused.  Doubt TIA, she reports that during this episode she did not have any vision changes, numbness or tingling, facial droop, weakness on one side of her body, dizziness, or any other concerning neurologic symptoms.  Her CT of her head is negative.  There is no sign of occult infection with no sign of pneumonia or urinalysis.  Doubt CNS infection, no meningismus, patient awake and oriented, no headaches.  Patient also not having any fevers or chills.  Toxic and metabolic  workup was obtained and otherwise reassuring with normal electrolytes and normal TSH, normal ammonia.  Will discharge, advise close follow-up with primary doctor.  Also discussed strict return precautions given unclear cause of episode confusion.  Specifically discussed any stroke symptoms. Will discharge patient to home. All questions answered. Patient comfortable with plan of discharge. Return precautions discussed with patient and specified on the after visit summary.   Clinical Course as of 02/10/23 1554  Sat Feb 10, 2023  1412 DG Chest [place] 1 View [WS]    Clinical Course User Index [WS] Lonell Grandchild, MD     Additional history obtained: -Additional history obtained from family -External records from outside source obtained and reviewed including: Chart review including previous notes, labs, imaging, consultation notes including recent visits for diarrhea   Lab Tests: -I ordered, reviewed, and interpreted labs.   The pertinent results include:   Labs Reviewed  COMPREHENSIVE METABOLIC PANEL - Abnormal; Notable for the following components:      Result Value   Glucose, Bld 174 (*)    BUN 25 (*)    Creatinine, Ser 1.55 (*)    GFR, Estimated 35 (*)    All other components within normal limits  CBG MONITORING, ED - Abnormal; Notable for the following components:   Glucose-Capillary 166 (*)    All other components within normal limits  CBC  TSH  AMMONIA  URINALYSIS, ROUTINE W REFLEX MICROSCOPIC  PROTIME-INR    Notable for mild AKI   Imaging Studies ordered: I ordered imaging studies including CXR, CT head On my interpretation imaging demonstrates no acute process I independently visualized and interpreted imaging. I agree with the radiologist interpretation   Medicines ordered and prescription drug management: Meds ordered this encounter  Medications   lactated ringers bolus 1,000 mL    -I have reviewed the patients home medicines and have made adjustments as  needed  Cardiac Monitoring: The patient was maintained on a cardiac monitor.  I personally viewed and interpreted the cardiac monitored which showed an underlying rhythm of: NSR  Social Determinants of Health:  Diagnosis or treatment significantly limited by social determinants of health: lives alone   Reevaluation: After the interventions noted above, I reevaluated the patient and found that their symptoms have improved  Co morbidities that complicate the patient evaluation  Past Medical History:  Diagnosis Date   Anxiety    B12 deficiency    Depression    Diabetes mellitus    History of coronary rotational ablation 10/30/2004   Hyperlipidemia  Renal stone    Sleep apnea    uses cpap      Dispostion: Disposition decision including need for hospitalization was considered, and patient discharged from emergency department.    Final Clinical Impression(s) / ED Diagnoses Final diagnoses:  Dehydration     This chart was dictated using voice recognition software.  Despite best efforts to proofread,  errors can occur which can change the documentation meaning.    Lonell Grandchild, MD 02/10/23 [phone]

## 2023-02-10 NOTE — Discharge Instructions (Addendum)
We evaluated you for your episode of mild confusion.  We obtained laboratory testing, CT scan of your head, urinalysis and a chest x-ray.  The laboratory testing was reassuring other than a slight kidney injury which is a sign of dehydration.  We gave you fluids in the emergency department.  Please be sure to drink lots of fluids at home.  Please follow-up closely with your primary doctor so they can recheck your kidney function.  Your other testing including your urinalysis, CT scan and chest x-ray did not show any dangerous process or signs of infection.  Please follow-up closely with your primary doctor.  Please return to the emergency department if you develop any new symptoms such as weakness, numbness or tingling, facial droop, trouble swallowing, vision changes, fevers, chest pain or abdominal pain, or any repeated episodes of confusion.

## 2023-02-10 NOTE — ED Notes (Signed)
UP to restroom with staff

## 2023-02-10 NOTE — ED Triage Notes (Signed)
Pt arrives with son who states for a few weeks he's noticed confusion. Patient is telling him about her parents coming to visit her, she also fell last night, saying she was too weak to walk across the room so she fell and ended up crawling to her destination. Ongoing diarrhea x 2 months, for which she is seeing GI. Patient reports frequent urination but denies pain.

## 2023-02-10 NOTE — ED Notes (Signed)
Pt discharged from ED via wc with Son. AVS discussed with pt and family. Pt verbalized understanding. Pt denies any complaints at this time.

## 2023-02-10 NOTE — ED Notes (Signed)
Patient transported to CT 

## 2023-02-12 ENCOUNTER — Telehealth: Payer: Self-pay

## 2023-02-12 ENCOUNTER — Telehealth: Payer: Self-pay | Admitting: Internal Medicine

## 2023-02-12 NOTE — Telephone Encounter (Signed)
Daughter of patient informed of message and agrees to come to appt tomorrow and will also attending appt with Psychiatrist

## 2023-02-12 NOTE — Telephone Encounter (Signed)
Diana George called first thing this morning saying her son had talked to you over the weekend and she had gone to the ED and she was to come in today for a follow up, however I wanted to talk with you before scheduling.

## 2023-02-12 NOTE — Transitions of Care (Post Inpatient/ED Visit) (Signed)
   02/12/2023  Name: Diana George MRN: [id number] DOB: Aug 12, 1946  Today's TOC FU Call Status: Today's TOC FU Call Status:: Successful TOC FU Call Competed TOC FU Call Complete Date: 02/12/23  Transition Care Management Follow-up Telephone Call    Items Reviewed: Did you receive and understand the discharge instructions provided?: Yes Medications obtained and verified?: Yes (Medications Reviewed) Any new allergies since your discharge?: No Dietary orders reviewed?: NA Do you have support at home?: Yes People in Home: alone  Home Care and Equipment/Supplies: Were Home Health Services Ordered?: NA Any new equipment or medical supplies ordered?: NA  Functional Questionnaire: Do you need assistance with bathing/showering or dressing?: No Do you need assistance with meal preparation?: No Do you need assistance with eating?: No Do you have difficulty maintaining continence: No Do you need assistance with getting out of bed/getting out of a chair/moving?: No Do you have difficulty managing or taking your medications?: No  Follow up appointments reviewed: PCP Follow-up appointment confirmed?: Yes Date of PCP follow-up appointment?: 02/13/23 Follow-up Provider: Dr. Lenord Fellers Specialist [facility] Follow-up appointment confirmed?: NA Do you need transportation to your follow-up appointment?: No Do you understand care options if your condition(s) worsen?: Yes-patient verbalized understanding    SIGNATURE Cecila Satcher D, CMA

## 2023-02-12 NOTE — Telephone Encounter (Signed)
TOC completed and patient scheduled

## 2023-02-12 NOTE — Telephone Encounter (Signed)
Diana George, daughter of patient is concerned about her mother and would like to talk to you just to be on the same page before patient's appt tomorrow [phone]

## 2023-02-13 ENCOUNTER — Encounter (HOSPITAL_COMMUNITY): Payer: Self-pay | Admitting: Registered Nurse

## 2023-02-13 ENCOUNTER — Encounter: Payer: Self-pay | Admitting: Internal Medicine

## 2023-02-13 ENCOUNTER — Other Ambulatory Visit (HOSPITAL_BASED_OUTPATIENT_CLINIC_OR_DEPARTMENT_OTHER): Payer: Self-pay

## 2023-02-13 ENCOUNTER — Other Ambulatory Visit: Payer: Self-pay | Admitting: Internal Medicine

## 2023-02-13 ENCOUNTER — Ambulatory Visit (INDEPENDENT_AMBULATORY_CARE_PROVIDER_SITE_OTHER): Payer: Medicare Other | Admitting: Internal Medicine

## 2023-02-13 ENCOUNTER — Other Ambulatory Visit: Payer: Self-pay

## 2023-02-13 ENCOUNTER — Telehealth: Payer: Self-pay | Admitting: Internal Medicine

## 2023-02-13 ENCOUNTER — Ambulatory Visit (HOSPITAL_COMMUNITY)
Admission: EM | Admit: 2023-02-13 | Discharge: 2023-02-13 | Disposition: A | Discharge status: Home / Self Care | Payer: Medicare Other | Attending: Registered Nurse | Admitting: Registered Nurse

## 2023-02-13 VITALS — BP 128/74 | HR 72 | Temp 99.1°F | Ht 66.0 in | Wt 188.0 lb

## 2023-02-13 DIAGNOSIS — R443 Hallucinations, unspecified: Secondary | ICD-10-CM | POA: Diagnosis not present

## 2023-02-13 DIAGNOSIS — E785 Hyperlipidemia, unspecified: Secondary | ICD-10-CM

## 2023-02-13 DIAGNOSIS — Z91148 Patient's other noncompliance with medication regimen for other reason: Secondary | ICD-10-CM

## 2023-02-13 DIAGNOSIS — K580 Irritable bowel syndrome with diarrhea: Secondary | ICD-10-CM

## 2023-02-13 DIAGNOSIS — R441 Visual hallucinations: Secondary | ICD-10-CM | POA: Insufficient documentation

## 2023-02-13 DIAGNOSIS — E119 Type 2 diabetes mellitus without complications: Secondary | ICD-10-CM

## 2023-02-13 DIAGNOSIS — E1169 Type 2 diabetes mellitus with other specified complication: Secondary | ICD-10-CM

## 2023-02-13 DIAGNOSIS — R251 Tremor, unspecified: Secondary | ICD-10-CM

## 2023-02-13 DIAGNOSIS — F419 Anxiety disorder, unspecified: Secondary | ICD-10-CM

## 2023-02-13 DIAGNOSIS — F333 Major depressive disorder, recurrent, severe with psychotic symptoms: Secondary | ICD-10-CM | POA: Diagnosis not present

## 2023-02-13 DIAGNOSIS — R442 Other hallucinations: Secondary | ICD-10-CM | POA: Diagnosis not present

## 2023-02-13 DIAGNOSIS — Z8639 Personal history of other endocrine, nutritional and metabolic disease: Secondary | ICD-10-CM | POA: Diagnosis not present

## 2023-02-13 DIAGNOSIS — F32A Depression, unspecified: Secondary | ICD-10-CM

## 2023-02-13 DIAGNOSIS — N1831 Chronic kidney disease, stage 3a: Secondary | ICD-10-CM

## 2023-02-13 MED ORDER — GLIPIZIDE 2.5 MG PO TABS
2.5000 mg | ORAL_TABLET | Freq: Two times a day (BID) | ORAL | 0 refills | Status: DC
Start: 2023-02-13 — End: 2023-04-16
  Filled 2023-02-14: qty 60, 30d supply, fill #0

## 2023-02-13 NOTE — Telephone Encounter (Signed)
Faxed note to Lindaann Slough that patient had been sent to [facility] for Evaulation  [phone] fax [phone] phone

## 2023-02-13 NOTE — Telephone Encounter (Signed)
-------  Fax Transmission Report-------  To:               Recipient at [phone] Subject:          Fw: Hp Scans Result:           The transmission was successful. Explanation:      All Pages Ok Pages Sent:       3 Connect Time:     1 minutes, 34 seconds Transmit Time:    02/13/2023 13:17 Transfer Rate:    14400 Status Code:      0000 Retry Count:      0 Job Id:           [id number] Unique Id:        [id number] Fax Line:         77 Fax Server:       [id number]

## 2023-02-13 NOTE — ED Provider Notes (Signed)
Behavioral Health Urgent Care Medical Screening Exam  Patient Name: Diana George MRN: [id number] Date of Evaluation: 02/13/23 Chief Complaint:   Diagnosis:  Final diagnoses:  Hypnagogic hallucinations  Visual hallucinations    History of Present illness: Diana George is a 77 y.o. female patient presented to [facility] as a walk in accompanied by her daughter with complaints of visual hallucinations  Diana George, 77 y.o., female patient seen face to face by this provider, consulted with Dr. Nelly Rout; and chart reviewed on 02/13/23.  On evaluation Diana George reports she has had several visual hallucinations.  "Usually when in bed going to sleep.  But the worst was the 2 that occurred at 6:00 in the morning."  Patient states most have occurred just prior to sleep or when falling to sleep.  States she see her mother and father holding hands and doing stuff together.  States the ones that occurred at 6 AM gave her the [employer].  Another concern is that patient is not compliant with her medications, forgetting to take some days and not on a consistent time schedule with medications; so thought that th hallucinations could be related to not being on medications.  Patient states was sent by her primary provider for psychiatric evaluation but she has an appointment at [facility] today at 2:50 PM and doesn't want to miss that appointment for medications management.  At this time she denies suicidal/self-harm/homicidal ideation, psychosis, and paranoia.  Daughter states she is going to set patients medications up for her and will speak to PCP about nursing going to home to check.    During evaluation Diana George is standing with no noted distress.  She is alert/oriented x 4, calm, cooperative, attentive, and responses were relevant and appropriate to assessment questions.  She spoke in a clear tone at moderate volume, and normal pace, with good eye contact.   She denies  suicidal/self-harm/homicidal ideation, psychosis, and paranoia.  Objectively:  there is no evidence of psychosis/mania or delusional thinking.  She conversed coherently, with goal directed thoughts, and no distractibility, or pre-occupation.    Flowsheet Row ED from 02/10/2023 in [facility] Emergency Department at [facility]  C-SSRS RISK CATEGORY No Risk       Psychiatric Specialty Exam  Presentation  General Appearance:Appropriate for Environment; Casual  Eye Contact:Good  Speech:Clear and Coherent; Normal Rate  Speech Volume:Normal  Handedness:Right   Mood and Affect  Mood:Euthymic  Affect:Appropriate; Congruent   Thought Process  Thought Processes:Coherent; Goal Directed  Descriptions of Associations:Intact  Orientation:Full (Time, Place and Person)  Thought Content:Logical; WDL    Hallucinations:None (Denies at current time.  States only happens at night prior to falling to sleep)  Ideas of Reference:None  Suicidal Thoughts:No  Homicidal Thoughts:No   Sensorium  Memory:Immediate Good; Recent Good  Judgment:Intact  Insight:Present   Executive Functions  Concentration:Good  Attention Span:Good  Recall:Good  Fund of Knowledge:Good  Language:Good   Psychomotor Activity  Psychomotor Activity:Normal   Assets  Assets:Communication Skills; Desire for Improvement; Financial Resources/Insurance; Housing; Leisure Time; Physical Health; Resilience; Social Support   Sleep  Sleep:Fair  Number of hours: No data recorded  Physical Exam: Physical Exam Vitals and nursing note reviewed. Exam conducted with a chaperone present.  Constitutional:      General: She is not in acute distress.    Appearance: Normal appearance. She is not ill-appearing.  HENT:     Head: Normocephalic.  Eyes:     Conjunctiva/sclera:  Conjunctivae normal.  Cardiovascular:     Rate and Rhythm: Normal rate.  Pulmonary:     Effort: Pulmonary effort is normal.   Musculoskeletal:        General: Normal range of motion.     Cervical back: Normal range of motion.  Skin:    General: Skin is warm and dry.  Neurological:     Mental Status: She is alert and oriented to person, place, and time.  Psychiatric:        Attention and Perception: Attention and perception normal. She does not perceive auditory hallucinations. Visual hallucinations: hallucinations that are occuring prior to sleep or when waking.       Mood and Affect: Mood and affect normal.        Speech: Speech normal.        Behavior: Behavior normal. Behavior is cooperative.        Thought Content: Thought content normal. Thought content is not paranoid or delusional. Thought content does not include homicidal or suicidal ideation.        Cognition and Memory: Cognition normal.    Review of Systems  Constitutional:        No verbal complaints voiced  Psychiatric/Behavioral:  Negative for substance abuse and suicidal ideas. Depression: Stable. Hallucinations: States seeing things prior to falling asleep and has happened twice just as waking in morning around 6:00 am.Nervous/anxious: Stable.   All other systems reviewed and are negative.  Blood pressure 136/79, pulse 84, temperature 98 F (36.7 C), resp. rate 18, SpO2 97 %. There is no height or weight on file to calculate BMI.  Musculoskeletal: Strength & Muscle Tone: within normal limits Gait & Station: normal Patient leans: N/A   [facility] MSE Discharge Disposition for Follow up and Recommendations: Based on my evaluation the patient does not appear to have an emergency medical condition and can be discharged with resources and follow up care in outpatient services for Medication Management  Keep scheduled appointment with [facility] for medication management.  Talk with PCP for home nursing medication management.        Ilisa Hayworth, NP 02/13/2023, 2:41 PM

## 2023-02-13 NOTE — Patient Instructions (Signed)
Patient be taken immediately to behavioral health center by her daughter for evaluation of mental status.  She has been having hallucinations at home which may be a discontinuation syndrome.  I think she needs a thorough evaluation by psychiatry and possibly medication change.  She may need inpatient treatment as well.

## 2023-02-13 NOTE — Progress Notes (Signed)
Patient Care Team: Margaree Mackintosh, MD as PCP - General (Internal Medicine)  Visit Date: 02/13/23  Subjective:    Patient ID: Diana George , Female   DOB: 02/06/1946, 77 y.o.    MRN: [id number]   77 y.o. Female presents today for ED follow-up. 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  Son called me on Saturday, April 13 indicating he was concerned about his mother and thought she was confused.  He felt that she might have a concussion.  She was advised to be seen in the emergency department.  See below for findings of that emergency department evaluation.  Patient tells me that she was having hallucinations daily recently.  Patient has been seen parents that are deceased recently vividly and says she was not asleep at the time.  This is concerning to me and I would like her evaluated on an emergency basis.  I did not know this until today.  Her daughter accompanies her today.  She has been seeing Ellis Savage for counseling for depression and has been to see her a couple of times but has not felt that to be helpful.  I thought she needed a medication evaluation and possibly a change in medications.  She was seen in the ED on 02/10/23 for an episode of confusion. Had a fall in which she hit her head on 02/09/23. Was having some generalized weakness, diarrhea for several months but there were no sudden changes with these symptoms recently. Head CT showed no acute findings. Chest X-ray showed no active disease. Treated conservatively in ED with fluids.Cr was elevated at 1.55 was thought to be volume depleted. Does have some CKD from having HTN for years. Baseline creatinine usually around 1.19- 1.23.  She has a history of hyperlipidemia, mild mitral regurgitation, anxiety, depression ,type 2 diabetes mellitus, chronic kidney disease seen by [facility]   History of essential tremor treated with propranolol.  Diabetes has been treated with Januvia and etformin.  History of osteopenia treated with Boniva.  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  She is retired.  Resides alone.  She is divorced.  1 son and 1 daughter.  Daughter is with her today.  She does not smoke.  Seldom drinks alcohol.  Family history: Father died in 2013-03-07 of septicemia with history of MI, coronary artery disease and hematoma of the brain.  He was 23 years old.  Her mother died in 03/07/13 with history of dementia and urinary tract infections age 77.  Recently has been seen at [facility] regarding diarrhea.  Workup was really unremarkable.  Recently has had some more issues with fecal incontinence.  Gastroenterologist, Dr. Orvan Falconer felt that patient had overflow diarrhea.  Less likely to have microscopic colitis.  Stool studies proved to be negative.  Last colonoscopy was in 03/08/15.  History of B12 deficiency and receives IM B12 monthly.   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  Chest clear.  Cardiac exam regular rate and rhythm.  She is alert and oriented and she recognizes me.  She is tremulous.  Brief neurological exam shows no gross focal deficits.   Results:   See results from ED evaluation  Imaging, colonoscopy, mammogram, bone density scan, echocardiogram, heart cath, stress test, CT calcium  score, etc.    Labs:       Component Value Date/Time   NA 139 02/10/2023 1158   K 4.3 02/10/2023 1158   CL 105 02/10/2023 1158   CO2 23 02/10/2023 1158   GLUCOSE 174 (H) 02/10/2023 1158   BUN 25 (H) 02/10/2023 1158   CREATININE 1.55 (H) 02/10/2023 1158   CREATININE 1.19 (H) 05/29/2022 0937   CALCIUM 9.2 02/10/2023 1158   PROT 6.5 02/10/2023 1158   ALBUMIN 3.7 02/10/2023 1158   AST 24 02/10/2023 1158   ALT 21 02/10/2023 1158   ALKPHOS 79 02/10/2023 1158   BILITOT 0.9 02/10/2023 1158   GFRNONAA 35 (L) 02/10/2023 1158   GFRNONAA 59 (L) 05/14/2020 0939   GFRAA 68 05/14/2020 0939     Lab Results  Component Value Date   WBC 7.1 02/10/2023   HGB 13.2 02/10/2023   HCT 41.3 02/10/2023   MCV 99.3 02/10/2023   PLT 325 02/10/2023    Lab Results  Component Value Date   CHOL 142 05/19/2022   HDL 56 05/19/2022   LDLCALC 69 05/19/2022   TRIG 85 05/19/2022   CHOLHDL 2.5 05/19/2022    Lab Results  Component Value Date   HGBA1C 5.9 (H) 05/19/2022     Lab Results  Component Value Date   TSH 2.690 02/10/2023          Assessment & Plan:   Hallucinations which are concerning-etiology unclear.  No prior history of hallucinations.  History of anxiety and depression-longstanding treated with antidepressant medication and antianxiety medication.  She did quit taking some of these medicines for few days and not sure if this is a discontinuation syndrome.  Longstanding history of taking Tranxene for anxiety.  This dates back to around the time she became divorced a number of years ago.  Safety at home-she resides alone and is at risk of falling.  Also has having hallucinations which is concerning.  I do not think she needs to be alone at this time.  Recent ED evaluation showed no evidence of subdural hematoma, brain tumor, stroke  History of diarrhea with negative workup recently by gastroenterologist.  Currently on Linzess  Chronic kidney disease-stable  Essential  hypertension  Hyperlipidemia treated with simvastatin  She is on Boniva for osteopenia.  Last bone density study showed lowest T-score to be -2.4  Impaired glucose tolerance treated with Januvia, glipizide and metformin  Plan: I am suggesting patient be evaluated urgently at [facility].  Daughter is in agreement and will take her there immediately.  I,Alexander Ruley,acting as a Neurosurgeon for Margaree Mackintosh, MD.,have documented all relevant documentation on the behalf of Margaree Mackintosh, MD,as directed by  Margaree Mackintosh, MD while in the presence of Margaree Mackintosh, MD.   I, Margaree Mackintosh, MD, have reviewed all documentation for this visit. The documentation on  02/13/23 for the exam, diagnosis, procedures, and orders are all accurate and complete.

## 2023-02-13 NOTE — Progress Notes (Signed)
   02/13/23 1411  [facility] Triage Screening (Walk-ins at [facility] only)  How Did You Hear About [place]? Family/Friend  What Is the Reason for Your Visit/Call Today? Pt is a 77 yo female who present voluntarily accompanied by her daughter. Pt reports she has had several visual hallucinations at bedtime.  Pt reports that she is seeing her deceased parents. Pt's daughter reports that pt is not complaint with her medications. Pt reported to her primary care doctor that she has not taken her medications for weeks. Pt's daughter believes that the hallucinations may be from withdrawals from pt's not taking her medications Pt reports that she currently does not see a therapist. Pt reports that she has a psychiatrist. Pt's daughter reports that pt has an appointment at [facility] today at 2:50 PM and doesn't want to miss that appointment for medications management. Pt denies SI, HI, and AVH.  How Long Has This Been Causing You Problems? 1 wk - 1 month  Have You Recently Had Any Thoughts About Hurting Yourself? No  Are You Planning to Commit Suicide/Harm Yourself At This time? No  Have you Recently Had Thoughts About Hurting Someone Karolee Ohs? No  Are You Planning To Harm Someone At This Time? No  Are you currently experiencing any auditory, visual or other hallucinations? Yes  Please explain the hallucinations you are currently experiencing: Pt reports she has had several visual hallucinations at bedtime  Have You Used Any Alcohol or Drugs in the Past 24 Hours? No  Do you have any current medical co-morbidities that require immediate attention? No  Clinician description of patient physical appearance/behavior: Pt was casually dressed and adequately groomed. Pt was polite and cooperative. Pt's speech, movement and thought content were within normal limits. Pt was oriented x 4.  What Do You Feel Would Help You the Most Today? Medication(s)  If access to [facility] was not available, would you have sought care in the  Emergency Department? Yes  Determination of Need Routine (7 days)  Options For Referral Outpatient Therapy    Flowsheet Row ED from 02/13/2023 in [facility] ED from 02/10/2023 in [facility] Emergency Department at [facility]  C-SSRS RISK CATEGORY No Risk No Risk

## 2023-02-14 ENCOUNTER — Other Ambulatory Visit (HOSPITAL_BASED_OUTPATIENT_CLINIC_OR_DEPARTMENT_OTHER): Payer: Self-pay

## 2023-02-14 ENCOUNTER — Other Ambulatory Visit: Payer: Self-pay | Admitting: Neurology

## 2023-02-14 ENCOUNTER — Telehealth: Payer: Self-pay | Admitting: Internal Medicine

## 2023-02-14 ENCOUNTER — Encounter: Payer: Self-pay | Admitting: Internal Medicine

## 2023-02-14 ENCOUNTER — Other Ambulatory Visit: Payer: Self-pay | Admitting: Internal Medicine

## 2023-02-14 DIAGNOSIS — G250 Essential tremor: Secondary | ICD-10-CM

## 2023-02-14 MED ORDER — GLIPIZIDE 5 MG PO TABS
2.5000 mg | ORAL_TABLET | Freq: Two times a day (BID) | ORAL | 0 refills | Status: DC
  Filled 2023-02-14: qty 30, 30d supply, fill #0

## 2023-02-14 NOTE — Telephone Encounter (Signed)
Called patient to follow up after visit yesterday. Patient Saw Diana George regarding hallucinations and current meds.  Diana George  advised patient to stop Tranxene and Bupropion.Diana George spent a couple of hours with patient yesterday.  Visit to [facility] was of no help. They told her if she was not suicidal then she did not need to be there.I was concerned and had directed her there because she had been having vivid hallucinations.We had hoped she would be evaluated for admission or would be seen for med consult as she  was experiencing hallucinations of seeing dead family members  and undergone  negative evaluation in ED including brain imaging.  Patient took Linzess this morning and had diarrhea. May not need this if not constipated. Reminded her this med was for constipation. May take hyoscyamine under the tongue if needed for diarrhea. I am sure GI and I have been through the use of these meds with her before but she seems clueless about these meds today as to what they are for and is dependent on others to sort these matters out for her. She needs to take responsibility for this.  I have spoken with her daughter last night. She may need Neurology consult and Neuropsychological testing.It takes several months to get an appt for this testing.

## 2023-02-14 NOTE — Telephone Encounter (Signed)
Powers, Margarito Liner, CMA  Simranjit, [place] [phone] months ago    Spoke with patient got her scheduled with Dr. Maple Hudson tomorrow :30am. NFN ATT

## 2023-02-20 ENCOUNTER — Telehealth: Payer: Self-pay | Admitting: Internal Medicine

## 2023-02-20 NOTE — Telephone Encounter (Signed)
This pt would like to transfer care to Dr Veto Kemps due to it's closer to home and wanting a second opinion.  When both have responded I will make the appointment.

## 2023-02-21 NOTE — Telephone Encounter (Signed)
I will call her.

## 2023-02-23 NOTE — Telephone Encounter (Signed)
Pt was called and was informed of message from Dr Veto Kemps. She expressed she understood ans would try back at a later date.

## 2023-03-02 NOTE — Progress Notes (Signed)
10/06/22- 76 yoF never smoker with OSA, complicated by Allergic Rhinitis, DM, Hyperlipidemia, Anxiety/Depression, CAD, Irritable Bowel,  Followed by Dr Wynona Neat L[facility] 01/19/21---------> Diagnosis obstructive sleep apnea in 2005 Has been using CPAP since then Repeat study in 2015 did reveal severe obstructive sleep apnea, initial study was moderate obstructive sleep apnea She currently uses CPAP on a nightly basis, she is compliant with use but does not like using it Usually falls asleep between 12 and 1230 Takes only a few minutes to fall asleep but very significantly No significant awakening Final wake up time about 4 AM She does not feel she is restored whenever she wakes up in the morning takes about an hour to get herself going -She called 12.7 reporting broken CPAP machine and was scheduled in. Lives alone. Body weight today-181 lbs Covid vax-4 Phizer Flu vax-declined CPAP auto 5-12/ [facility] Download compliance-44%, AHI 2.4/ hr. Stopped recording in November. She is having some vasomotor rhinorrhea in the mornings which I suspect reflects recent indoor heat.  She does not know how to adjust humidifier.  Says she sometimes cannot relax with CPAP.  We talked briefly about alternatives to CPAP.  We are going to replace this machine and then see how she is feeling in a couple of months.  03/05/23- 76 yoF never smoker with OSA, complicated by Allergic Rhinitis, Vasomotor Rhinitis,  DM, Hyperlipidemia, Anxiety/Depression, CAD, Irritable Bowel,  Followed by Dr Wynona Neat [facility] 01/19/21---------> Diagnosis obstructive sleep apnea in 2005 Has been using CPAP since then Repeat study in 2015 did reveal severe obstructive sleep apnea, initial study was moderate obstructive sleep apnea CPAP auto 5-12/ [facility]  assisted with replacement  set-up 01/18/23 Download compliance-97%, AHI 3.4/ hr Body weight today-193 lbs ED 02/13/23- Psychiatry for "hypnagogic hallucination".  She says this was caused by occasions and  GI upset and now resolved. Complains of issues with cpap mask- states she can't keep it on her head.  Complains of service and asks change of DME company. Download reviewed.  ROS-see HPI   + = positive Constitutional:    weight loss, night sweats, fevers, chills, fatigue, lassitude. HEENT:    headaches, difficulty swallowing, tooth/dental problems, sore throat,       sneezing, itching, ear ache, nasal congestion, +post nasal drip, snoring CV:    chest pain, orthopnea, PND, swelling in lower extremities, anasarca,                                   dizziness, palpitations Resp:   shortness of breath with exertion or at rest.                productive cough,   non-productive cough, coughing up of blood.              change in color of mucus.  wheezing.   Skin:    rash or lesions. GI:  No-   heartburn, indigestion, abdominal pain, nausea, vomiting, diarrhea,                 change in bowel habits, loss of appetite GU: dysuria, change in color of urine, no urgency or frequency.   flank pain. MS:   joint pain, stiffness, decreased range of motion, back pain. Neuro-     nothing unusual Psych:  change in mood or affect.  depression or anxiety.   memory loss.'  OBJ- Physical Exam General- Alert, Oriented, Affect-appropriate, Distress- none acute Skin- rash-none,  lesions- none, excoriation- none Lymphadenopathy- none Head- atraumatic            Eyes- Gross vision intact, PERRLA, conjunctivae and secretions clear            Ears- Hearing, canals-normal            Nose- Clear, no-Septal dev, mucus, polyps, erosion, perforation             Throat- Mallampati IV , mucosa clear , drainage- none, tonsils- atrophic, +teeth Neck- flexible , trachea midline, no stridor , thyroid nl, carotid no bruit Chest - symmetrical excursion , unlabored           Heart/CV- RRR , no murmur , no gallop  , no rub, nl s1 s2                           - JVD- none , edema- none, stasis changes- none, varices- none            Lung- clear to P&A, wheeze- none, cough- none , dullness-none, rub- none           Chest wall-  Abd-  Br/ Gen/ Rectal- Not done, not indicated Extrem- cyanosis- none, clubbing, none, atrophy- none, strength- nl Neuro-+ slight resting tremor fingers especially left hand

## 2023-03-04 ENCOUNTER — Other Ambulatory Visit: Payer: Self-pay | Admitting: Internal Medicine

## 2023-03-05 ENCOUNTER — Encounter: Payer: Self-pay | Admitting: Internal Medicine

## 2023-03-05 ENCOUNTER — Other Ambulatory Visit: Payer: Self-pay | Admitting: Internal Medicine

## 2023-03-05 ENCOUNTER — Other Ambulatory Visit (HOSPITAL_BASED_OUTPATIENT_CLINIC_OR_DEPARTMENT_OTHER): Payer: Self-pay

## 2023-03-05 ENCOUNTER — Ambulatory Visit: Payer: Medicare Other | Admitting: Internal Medicine

## 2023-03-05 VITALS — BP 120/70 | HR 67 | Ht 66.0 in | Wt 193.8 lb

## 2023-03-05 DIAGNOSIS — G4733 Obstructive sleep apnea (adult) (pediatric): Secondary | ICD-10-CM | POA: Diagnosis not present

## 2023-03-05 DIAGNOSIS — R442 Other hallucinations: Secondary | ICD-10-CM

## 2023-03-05 MED ORDER — GLIPIZIDE 5 MG PO TABS
2.5000 mg | ORAL_TABLET | Freq: Two times a day (BID) | ORAL | 0 refills | Status: DC
  Filled 2023-03-05: qty 30, 30d supply, fill #0

## 2023-03-05 NOTE — Patient Instructions (Signed)
Order- [facility]-- Patient asks to change DME from [employer] due to poor service. Needs DME in [place] to continue CPAP auto 5-12, mask of choice, humidifier, supplies, AirView/ card.   Needs mask refitted for better fit and seal.

## 2023-03-05 NOTE — Telephone Encounter (Signed)
Spoke with patient about refill request and why tablet is being split in half,  patient says she has no idea and that was the way it has always been.

## 2023-03-06 DIAGNOSIS — G4733 Obstructive sleep apnea (adult) (pediatric): Secondary | ICD-10-CM | POA: Diagnosis not present

## 2023-03-07 ENCOUNTER — Ambulatory Visit: Payer: Medicare Other

## 2023-03-14 ENCOUNTER — Ambulatory Visit: Payer: Medicare Other

## 2023-03-14 ENCOUNTER — Ambulatory Visit: Payer: Medicare Other | Admitting: Nurse Practitioner

## 2023-03-15 ENCOUNTER — Ambulatory Visit (INDEPENDENT_AMBULATORY_CARE_PROVIDER_SITE_OTHER): Payer: Medicare Other

## 2023-03-15 VITALS — BP 104/62 | HR 69 | Temp 98.9°F | Ht 66.0 in | Wt 193.0 lb

## 2023-03-15 DIAGNOSIS — E538 Deficiency of other specified B group vitamins: Secondary | ICD-10-CM | POA: Diagnosis not present

## 2023-03-15 MED ORDER — CYANOCOBALAMIN 1000 MCG/ML IJ SOLN
1000.0000 ug | Freq: Once | INTRAMUSCULAR | Status: AC
Start: 2023-03-15 — End: 2023-03-15
  Administered 2023-03-15: 1000 ug via INTRAMUSCULAR

## 2023-03-15 NOTE — Progress Notes (Signed)
Per orders of [place], Luanna Cole, MD, injection of B12 given in  right deltoid by Livy Ross D Taequan Stockhausen. Patient tolerated injection well. Vital signs were within normal limits   Lab Results  Component Value Date   VITAMINB12 328 05/22/2022

## 2023-03-20 DIAGNOSIS — F331 Major depressive disorder, recurrent, moderate: Secondary | ICD-10-CM | POA: Diagnosis not present

## 2023-03-21 DIAGNOSIS — F4320 Adjustment disorder, unspecified: Secondary | ICD-10-CM | POA: Diagnosis not present

## 2023-03-21 DIAGNOSIS — Z5181 Encounter for therapeutic drug level monitoring: Secondary | ICD-10-CM | POA: Diagnosis not present

## 2023-03-21 DIAGNOSIS — F3341 Major depressive disorder, recurrent, in partial remission: Secondary | ICD-10-CM | POA: Diagnosis not present

## 2023-03-21 DIAGNOSIS — F333 Major depressive disorder, recurrent, severe with psychotic symptoms: Secondary | ICD-10-CM | POA: Diagnosis not present

## 2023-03-28 ENCOUNTER — Encounter: Payer: Self-pay | Admitting: Internal Medicine

## 2023-03-28 NOTE — Assessment & Plan Note (Signed)
Not sure if underlying issues contributed to her difficulty with CPAP mask.  Problem seems to be resolved by her report.

## 2023-03-28 NOTE — Assessment & Plan Note (Signed)
Benefits from CPAP with good compliance and control.  She is unhappy with DME service and asks company change.  Unhappy with mask that she can keep on and asks for refitting. Lan-change DME company per patient request, continue auto 5-12, refit mask.

## 2023-04-02 DIAGNOSIS — K080 Exfoliation of teeth due to systemic causes: Secondary | ICD-10-CM | POA: Diagnosis not present

## 2023-04-03 DIAGNOSIS — F331 Major depressive disorder, recurrent, moderate: Secondary | ICD-10-CM | POA: Diagnosis not present

## 2023-04-04 ENCOUNTER — Ambulatory Visit: Payer: Medicare Other | Admitting: Physician Assistant

## 2023-04-04 ENCOUNTER — Encounter: Payer: Self-pay | Admitting: Physician Assistant

## 2023-04-04 VITALS — BP 108/68 | HR 64 | Ht 66.0 in | Wt 195.2 lb

## 2023-04-04 DIAGNOSIS — K5909 Other constipation: Secondary | ICD-10-CM | POA: Diagnosis not present

## 2023-04-04 NOTE — Progress Notes (Signed)
Chief Complaint: Follow up Constipation  HPI:     Diana George is a 77 year old female with a past medical history as listed below including diabetes, who presents to clinic today for follow-up of constipation/overflow.     12/13/2022 office visit with Dr. Orvan Falconer and at that time discussed diarrhea and fecal incontinence with a recent change in bowel form.  There is question overflow diarrhea given prior KUB findings and response to bowel purge.  At that time recommended a fiber supplement and KUB.  If KUB showed no stool burden then empiric trial of Xifaxan +/- colonoscopy and EGD.    12/16/2022 KUB with moderate colonic stool and a nonspecific bowel gas pattern.  At that time patient continued on fiber and recommended a low-dose of Linzess 72 mcg daily.    12/25/2022 patient went to see PCP and that time noted that stool studies including fecal leukocytes, ova and parasites were negative.  She was told that she should not use Linzess with diarrhea.    02/01/2023 patient seen in clinic and at that time was having a hard time with constipation and overflow still.  She was continued on Linzess 72 mcg daily and added MiraLAX.    Today, patient tells me that everything is much much better.  Apparently she was on 12 different medications and quit them all cold [place] because "I was taking too much medicine", apparently did have some side effects from this including hallucinations and dizziness, she eventually followed up with her PCP who told her that was likely not a good idea but now instead of 12 medicines she is only on 6 and is much much happier.  She has no further bowel issues.  She is not taking the Linzess at all because it was giving her diarrhea and she has to stools a day typically, very occasionally will see some balls.  She is also altered her diet more high in fiber with yogurt and additional water.  She is happy.    Denies fever, chills, weight loss, blood in her stool, nausea or vomiting.  Past  Medical History:  Diagnosis Date   Anxiety    B12 deficiency    Depression    Diabetes mellitus    History of coronary rotational ablation 10/30/2004   Hyperlipidemia    Renal stone    Sleep apnea    uses cpap    Past Surgical History:  Procedure Laterality Date   CATARACT EXTRACTION W/ INTRAOCULAR LENS  IMPLANT, BILATERAL  2010   heart ablation  2010   LEG SURGERY Left 2011   fracture with hardware   STONE EXTRACTION WITH BASKET  2016, 1977   TUBAL LIGATION  1977    Current Outpatient Medications  Medication Sig Dispense Refill   Cholecalciferol (VITAMIN D) 2000 UNITS CAPS Take 1 capsule by mouth 2 (two) times daily.     CYANOCOBALAMIN IJ Inject as directed every 30 (thirty) days.     glipiZIDE 2.5 MG TABS Take 2.5 mg by mouth 2 (two) times daily. Take before breakfast and supper 60 tablet 0   hyoscyamine (LEVSIN) 0.125 MG tablet Take 0.125 mg by mouth as needed.     JANUVIA 100 MG tablet TAKE 1 TABLET(100 MG) BY MOUTH DAILY 90 tablet 0   propranolol (INDERAL) 20 MG tablet Take 1 tablet (20 mg total) by mouth 2 (two) times daily. 180 tablet 0   sertraline (ZOLOFT) 100 MG tablet TAKE 1 TABLET BY MOUTH DAILY GENERIC EQUIVALENT FOR ZOLOFT 90  tablet 0   simvastatin (ZOCOR) 20 MG tablet TAKE 1 TABLET BY MOUTH DAILY 90 tablet 1   glipiZIDE (GLUCOTROL) 5 MG tablet Take 0.5 tablets (2.5 mg total) by mouth 2 (two) times daily with breakfast and lunch. 30 tablet 0   No current facility-administered medications for this visit.    Allergies as of 04/04/2023 - Review Complete 04/04/2023  Allergen Reaction Noted   Amoxicillin Rash 06/09/2011    Family History  Problem Relation Age of Onset   Alzheimer's disease Mother    Alzheimer's disease Father    Heart disease Father    Colon cancer Father 67   Tremor Father     Social History   Socioeconomic History   Marital status: Divorced    Spouse name: Not on file   Number of children: Not on file   Years of education: Not on  file   Highest education level: Not on file  Occupational History   Occupation: retired  Tobacco Use   Smoking status: Never   Smokeless tobacco: Never  Vaping Use   Vaping Use: Never used  Substance and Sexual Activity   Alcohol use: No    Alcohol/week: 0.0 standard drinks of alcohol   Drug use: No   Sexual activity: Not on file  Other Topics Concern   Not on file  Social History Narrative   Not on file   Social Determinants of Health   Financial Resource Strain: Not on file  Food Insecurity: Not on file  Transportation Needs: Not on file  Physical Activity: Not on file  Stress: Not on file  Social Connections: Not on file  Intimate Partner Violence: Not on file    Review of Systems:    Constitutional: No weight loss, fever or chills Cardiovascular: No chest pain Respiratory: No SOB Gastrointestinal: See HPI and otherwise negative   Physical Exam:  Vital signs: BP 108/68 (BP Location: Left Arm, Patient Position: Sitting, Cuff Size: Normal)   Pulse 64   Ht 5\' 6"  (1.676 m)   Wt 195 lb 4 oz (88.6 kg)   SpO2 93%   BMI 31.51 kg/m   Constitutional:   Pleasant Elderly Caucasian female appears to be in NAD, Well developed, Well nourished, alert and cooperative Respiratory: Respirations even and unlabored. Lungs clear to auscultation bilaterally.   No wheezes, crackles, or rhonchi.  Cardiovascular: Normal S1, S2. No MRG. Regular rate and rhythm. No peripheral edema, cyanosis or pallor.  Gastrointestinal:  Soft, nondistended, nontender. No rebound or guarding. Normal bowel sounds. No appreciable masses or hepatomegaly. Rectal:  Not performed.  Psychiatric:  Demonstrates good judgement and reason without abnormal affect or behaviors.  RELEVANT LABS AND IMAGING: CBC    Component Value Date/Time   WBC 7.1 02/10/2023 1158   RBC 4.16 02/10/2023 1158   HGB 13.2 02/10/2023 1158   HCT 41.3 02/10/2023 1158   PLT 325 02/10/2023 1158   MCV 99.3 02/10/2023 1158   MCH 31.7  02/10/2023 1158   MCHC 32.0 02/10/2023 1158   RDW 12.8 02/10/2023 1158   LYMPHSABS 2.3 12/01/2022 1316   MONOABS 0.6 12/01/2022 1316   EOSABS 0.5 12/01/2022 1316   BASOSABS 0.1 12/01/2022 1316    CMP     Component Value Date/Time   NA 139 02/10/2023 1158   K 4.3 02/10/2023 1158   CL 105 02/10/2023 1158   CO2 23 02/10/2023 1158   GLUCOSE 174 (H) 02/10/2023 1158   BUN 25 (H) 02/10/2023 1158   CREATININE 1.55 (H)  02/10/2023 1158   CREATININE 1.19 (H) 05/29/2022 0937   CALCIUM 9.2 02/10/2023 1158   PROT 6.5 02/10/2023 1158   ALBUMIN 3.7 02/10/2023 1158   AST 24 02/10/2023 1158   ALT 21 02/10/2023 1158   ALKPHOS 79 02/10/2023 1158   BILITOT 0.9 02/10/2023 1158   GFRNONAA 35 (L) 02/10/2023 1158   GFRNONAA 59 (L) 05/14/2020 0939   GFRAA 68 05/14/2020 0939    Assessment: 1.  Overflow constipation: Better now that patient has been taken off of over half of her other medicines, no longer requiring a laxative, she has also changed her diet to high fiber and drinking more water  Plan: 1.  Discussed with patient that I am glad that she is feeling better.  Discussed real recommendations for water intake are half your body weight in ounces of water a day.  She tries to get at least 80 ounces. 2.  Patient to follow in clinic with [place] as needed.  She was previously followed by Dr. Orvan Falconer.  Today assigned to Dr. Tomasa Rand.  Hyacinth Meeker, PA-C [facility] 04/04/2023, 10:10 AM  Cc: Margaree Mackintosh, MD

## 2023-04-09 ENCOUNTER — Telehealth: Payer: Self-pay | Admitting: Family Medicine

## 2023-04-09 NOTE — Telephone Encounter (Signed)
Please advise 

## 2023-04-09 NOTE — Progress Notes (Signed)
Agree with the assessment and plan as outlined by Jennifer Lemmon, PA-C. ? ?Shaunna Rosetti E. Lilyann Gravelle, MD ? ?

## 2023-04-09 NOTE — Telephone Encounter (Signed)
Patient called and would like a TOC from [facility] to [place] due to traveling distance. She states this office is closer to home. Please advise

## 2023-04-09 NOTE — Telephone Encounter (Signed)
Okay to change providers per both providers. Thanks.

## 2023-04-16 ENCOUNTER — Other Ambulatory Visit (HOSPITAL_BASED_OUTPATIENT_CLINIC_OR_DEPARTMENT_OTHER): Payer: Self-pay

## 2023-04-16 ENCOUNTER — Other Ambulatory Visit: Payer: Self-pay | Admitting: Internal Medicine

## 2023-04-16 MED ORDER — GLIPIZIDE 5 MG PO TABS
2.5000 mg | ORAL_TABLET | Freq: Two times a day (BID) | ORAL | 0 refills | Status: DC
  Filled 2023-04-16: qty 30, 30d supply, fill #0

## 2023-04-17 ENCOUNTER — Encounter: Payer: Medicare Other | Admitting: Family Medicine

## 2023-04-19 ENCOUNTER — Ambulatory Visit (INDEPENDENT_AMBULATORY_CARE_PROVIDER_SITE_OTHER): Payer: Medicare Other

## 2023-04-19 VITALS — HR 75

## 2023-04-19 DIAGNOSIS — E538 Deficiency of other specified B group vitamins: Secondary | ICD-10-CM

## 2023-04-19 MED ORDER — CYANOCOBALAMIN 1000 MCG/ML IJ SOLN
1000.0000 ug | Freq: Once | INTRAMUSCULAR | Status: AC
Start: 2023-04-19 — End: 2023-04-19
  Administered 2023-04-19: 1000 ug via INTRAMUSCULAR

## 2023-04-19 NOTE — Progress Notes (Signed)
Diana George is a 77 y.o. presents today for injection of B12. Patient given of B12 in right delt. No adverse reactions noted.    IMargaree Mackintosh, MD, have reviewed all documentation for this visit. The documentation on 04/19/23 for the exam, diagnosis, procedures, and orders are all accurate and complete.

## 2023-04-30 ENCOUNTER — Other Ambulatory Visit (HOSPITAL_BASED_OUTPATIENT_CLINIC_OR_DEPARTMENT_OTHER): Payer: Self-pay

## 2023-04-30 ENCOUNTER — Ambulatory Visit (INDEPENDENT_AMBULATORY_CARE_PROVIDER_SITE_OTHER): Payer: Medicare Other | Admitting: Family Medicine

## 2023-04-30 ENCOUNTER — Encounter: Payer: Self-pay | Admitting: Family Medicine

## 2023-04-30 VITALS — BP 106/60 | HR 67 | Ht 66.0 in | Wt 197.0 lb

## 2023-04-30 DIAGNOSIS — E538 Deficiency of other specified B group vitamins: Secondary | ICD-10-CM

## 2023-04-30 DIAGNOSIS — L989 Disorder of the skin and subcutaneous tissue, unspecified: Secondary | ICD-10-CM

## 2023-04-30 DIAGNOSIS — E1165 Type 2 diabetes mellitus with hyperglycemia: Secondary | ICD-10-CM

## 2023-04-30 DIAGNOSIS — F32A Depression, unspecified: Secondary | ICD-10-CM

## 2023-04-30 DIAGNOSIS — Z7984 Long term (current) use of oral hypoglycemic drugs: Secondary | ICD-10-CM

## 2023-04-30 DIAGNOSIS — G4733 Obstructive sleep apnea (adult) (pediatric): Secondary | ICD-10-CM

## 2023-04-30 DIAGNOSIS — F419 Anxiety disorder, unspecified: Secondary | ICD-10-CM

## 2023-04-30 DIAGNOSIS — Z0000 Encounter for general adult medical examination without abnormal findings: Secondary | ICD-10-CM

## 2023-04-30 LAB — HEMOGLOBIN A1C: Hgb A1c MFr Bld: 6.8 % — ABNORMAL HIGH (ref 4.6–6.5)

## 2023-04-30 LAB — COMPREHENSIVE METABOLIC PANEL
ALT: 18 U/L (ref 0–35)
AST: 19 U/L (ref 0–37)
Albumin: 4.2 g/dL (ref 3.5–5.2)
Alkaline Phosphatase: 80 U/L (ref 39–117)
BUN: 27 mg/dL — ABNORMAL HIGH (ref 6–23)
CO2: 25 mEq/L (ref 19–32)
Calcium: 9.6 mg/dL (ref 8.4–10.5)
Chloride: 106 mEq/L (ref 96–112)
Creatinine, Ser: 1.07 mg/dL (ref 0.40–1.20)
GFR: 50.42 mL/min — ABNORMAL LOW (ref >60.00)
Glucose, Bld: 186 mg/dL — ABNORMAL HIGH (ref 70–99)
Potassium: 4.7 mEq/L (ref 3.5–5.1)
Sodium: 139 mEq/L (ref 135–145)
Total Bilirubin: 0.6 mg/dL (ref 0.2–1.2)
Total Protein: 6.5 g/dL (ref 6.0–8.3)

## 2023-04-30 LAB — VITAMIN B12: Vitamin B-12: 534 pg/mL (ref 211–911)

## 2023-04-30 MED ORDER — SITAGLIPTIN PHOSPHATE 100 MG PO TABS
100.0000 mg | ORAL_TABLET | Freq: Every day | ORAL | 0 refills | Status: DC
Start: 2023-04-30 — End: 2023-08-08
  Filled 2023-04-30: qty 30, 30d supply, fill #0
  Filled 2023-07-09: qty 30, 30d supply, fill #1
  Filled 2023-08-07: qty 30, 30d supply, fill #2

## 2023-04-30 MED ORDER — GLIPIZIDE 5 MG PO TABS
2.5000 mg | ORAL_TABLET | Freq: Two times a day (BID) | ORAL | 0 refills | Status: DC
Start: 2023-04-30 — End: 2023-07-09
  Filled 2023-04-30 – 2023-05-09 (×2): qty 90, 90d supply, fill #0

## 2023-04-30 NOTE — Assessment & Plan Note (Signed)
Vitamin B12 Deficiency: On monthly B12 injections. Last injection was on 04/19/2023. -Check B12 level today. -Continue monthly B12 injections at this clinic.

## 2023-04-30 NOTE — Progress Notes (Addendum)
New Patient Office Visit  Subjective    Patient ID: Diana George, female    DOB: Mar 20, 1946  Age: 77 y.o. MRN: [id number]  CC:  Chief Complaint  Patient presents with   Establish Care    HPI Diana George presents to establish care. She is transferring from Dr. Beryle Quant office.   Discussed the use of AI scribe software for clinical note transcription with the patient, who gave verbal consent to proceed.  History of Present Illness   The patient, with a history of diabetes, B12 deficiency, hyperlipidemia, depression, and tremors, presents to establish care. They have been taking vitamin D 'forever' and receive monthly B12 injections. They are currently on glipizide 2.5mg  twice daily and Januvia 100mg  daily for diabetes, having switched from metformin a few months ago due to a friend's recommendation. They occasionally check their blood sugars and can tell when they are high or low, with symptoms of shakiness when low - state this is not a common complaint. They are also on simvastatin for cholesterol and diabetes, and sertraline 100mg  daily for mood, which they report is working well. For tremors, they take propranolol as prescribed by a neurologist. They also take hyoscyamine as needed. They have been diagnosed with chronic constipation and are working with a gastroenterologist to manage this. They also use a CPAP machine for sleep apnea, but report difficulty with the mask falling off during the night - following with pulmonology for this. They have a patchy, scaly lesion on their scalp that has developed over the last month, which is not itchy or painful.               Outpatient Encounter Medications as of 04/30/2023  Medication Sig   Cholecalciferol (VITAMIN D) 2000 UNITS CAPS Take 1 capsule by mouth 2 (two) times daily.   CYANOCOBALAMIN IJ Inject as directed every 30 (thirty) days.   hyoscyamine (LEVSIN) 0.125 MG tablet Take 0.125 mg by mouth as needed.   propranolol  (INDERAL) 20 MG tablet Take 1 tablet (20 mg total) by mouth 2 (two) times daily.   sertraline (ZOLOFT) 100 MG tablet TAKE 1 TABLET BY MOUTH DAILY GENERIC EQUIVALENT FOR ZOLOFT   simvastatin (ZOCOR) 20 MG tablet TAKE 1 TABLET BY MOUTH DAILY   [DISCONTINUED] glipiZIDE (GLUCOTROL) 5 MG tablet Take 0.5 tablets (2.5 mg total) by mouth 2 (two) times daily with breakfast and lunch.   [DISCONTINUED] JANUVIA 100 MG tablet TAKE 1 TABLET(100 MG) BY MOUTH DAILY   glipiZIDE (GLUCOTROL) 5 MG tablet Take 0.5 tablets (2.5 mg total) by mouth 2 (two) times daily with breakfast and lunch.   sitaGLIPtin (JANUVIA) 100 MG tablet Take 1 tablet (100 mg total) by mouth daily.   No facility-administered encounter medications on file as of 04/30/2023.    Past Medical History:  Diagnosis Date   Anxiety    B12 deficiency    Depression    Diabetes mellitus    History of coronary rotational ablation 10/30/2004   Hyperlipidemia    Renal stone    Sleep apnea    uses cpap    Past Surgical History:  Procedure Laterality Date   CATARACT EXTRACTION W/ INTRAOCULAR LENS  IMPLANT, BILATERAL  2010   heart ablation  2010   LEG SURGERY Left 2011   fracture with hardware   STONE EXTRACTION WITH BASKET  2016, 1977   TUBAL LIGATION  1977    Family History  Problem Relation Age of Onset   Alzheimer's disease Mother  Alzheimer's disease Father    Heart disease Father    Colon cancer Father 78   Tremor Father     Social History   Socioeconomic History   Marital status: Divorced    Spouse name: Not on file   Number of children: Not on file   Years of education: Not on file   Highest education level: Not on file  Occupational History   Occupation: retired  Tobacco Use   Smoking status: Never   Smokeless tobacco: Never  Vaping Use   Vaping Use: Never used  Substance and Sexual Activity   Alcohol use: No    Alcohol/week: 0.0 standard drinks of alcohol   Drug use: No   Sexual activity: Not on file  Other  Topics Concern   Not on file  Social History Narrative   Not on file   Social Determinants of Health   Financial Resource Strain: Not on file  Food Insecurity: Not on file  Transportation Needs: Not on file  Physical Activity: Not on file  Stress: Not on file  Social Connections: Not on file  Intimate Partner Violence: Not on file    ROS All review of systems negative except what is listed in the HPI      Objective    BP 106/60   Pulse 67   Ht 5\' 6"  (1.676 m)   Wt 197 lb (89.4 kg)   SpO2 97%   BMI 31.80 kg/m   Physical Exam Vitals reviewed.  Constitutional:      Appearance: Normal appearance.  Cardiovascular:     Rate and Rhythm: Normal rate and regular rhythm.     Pulses: Normal pulses.     Heart sounds: Normal heart sounds.  Pulmonary:     Effort: Pulmonary effort is normal.     Breath sounds: Normal breath sounds.  Skin:    General: Skin is warm and dry.     Comments: ~1.5cm raised flesh-colored scaly lesion to scalp; no significant flaking/dandruff noted  Neurological:     Mental Status: She is alert and oriented to person, place, and time.  Psychiatric:        Mood and Affect: Mood normal.        Behavior: Behavior normal.        Thought Content: Thought content normal.        Judgment: Judgment normal.         Assessment & Plan:   Problem List Items Addressed This Visit     Diabetes mellitus (HCC)    Type 2 Diabetes Mellitus: Controlled with Glipizide 2.5mg  twice daily and Januvia 100mg  daily. No recent hypoglycemic episodes. -Check A1C and urine microalbumin today. -Refill Glipizide and Januvia prescriptions.      Relevant Medications   glipiZIDE (GLUCOTROL) 5 MG tablet   sitaGLIPtin (JANUVIA) 100 MG tablet   Other Relevant Orders   HgB A1c   Microalbumin / creatinine urine ratio   Comprehensive metabolic panel   Anxiety and depression    Mood stable on Zoloft 100 mg - states she has been doing well on this dose for awhile now.       OSA (obstructive sleep apnea)    Sleep Apnea: Using CPAP machine but having difficulty with mask fit. -Continue follow-up with Pulmonology for mask adjustment after rental period ends.      B12 deficiency    Vitamin B12 Deficiency: On monthly B12 injections. Last injection was on 04/19/2023. -Check B12 level today. -Continue monthly B12 injections at  this clinic.      Relevant Orders   Vitamin B12   Other Visit Diagnoses     Encounter for medical examination to establish care    -  Primary    Scalp lesion     Scalp Lesions: New onset of patchy, scaly lesions on the scalp. Not itchy or painful. -Refer to Dermatology for evaluation    Relevant Orders   Ambulatory referral to Dermatology              Return in about 3 months (around 07/31/2023) for routine follow-up (AWV after 05/23/23).   Cl[place]borne Dana, NP

## 2023-04-30 NOTE — Assessment & Plan Note (Signed)
Mood stable on Zoloft 100 mg - states she has been doing well on this dose for awhile now.

## 2023-04-30 NOTE — Patient Instructions (Signed)
Thank you for choosing [facility] at [facility] for your Primary Care needs. I am excited for the opportunity to partner with you to meet your health care goals. It was a pleasure meeting you today!  Information on diet, exercise, and health maintenance recommendations are listed below. This is information to help you be sure you are on track for optimal health and monitoring.   Please look over this and let us know if you have any questions or if you have completed any of the health maintenance outside of  so that we can be sure your records are up to date.  ___________________________________________________________  MyChart:  For all urgent or time sensitive needs we ask that you please call the office to avoid delays. Our number is [phone]. MyChart is not constantly monitored and due to the large volume of messages a day, replies may take up to 72 business hours.  MyChart Policy: MyChart allows for you to see your visit notes, after visit summary, provider recommendations, lab and tests results, make an appointment, request refills, and contact your provider or the office for non-urgent questions or concerns. Providers are seeing patients during normal business hours and do not have built in time to review MyChart messages.  We ask that you allow a minimum of 3 business days for responses to MyChart messages. For this reason, please do not send urgent requests through MyChart. Please call the office at [phone]. New and ongoing conditions may require a visit. We have virtual and in-person visits available for your convenience.  Complex MyChart concerns may require a visit. Your provider may request you schedule a virtual or in-person visit to ensure we are providing the best care possible. MyChart messages sent after 11:00 AM on Friday will not be received by the provider until Monday morning.    Lab and Test Results: You will receive your lab and test  results on MyChart as soon as they are completed and results have been sent by the lab or testing facility. Due to this service, you will receive your results BEFORE your provider.  I review lab and test results each morning prior to seeing patients. Some results require collaboration with other providers to ensure you are receiving the most appropriate care. For this reason, we ask that you please allow a minimum of 3-5 business days from the time that ALL results have been received for your provider to receive and review lab and test results and contact you about these.  Most lab and test result comments from the provider will be sent through MyChart. Your provider may recommend changes to the plan of care, follow-up visits, repeat testing, ask questions, or request an office visit to discuss these results. You may reply directly to this message or call the office to provide information for the provider or set up an appointment. In some instances, you will be called with test results and recommendations. Please let us know if this is preferred and we will make note of this in your chart to provide this for you.    If you have not heard a response to your lab or test results in 5 business days from all results returning to MyChart, please call the office to let us know. We ask that you please avoid calling prior to this time unless there is an emergent concern. Due to high call volumes, this can delay the resulting process.  After Hours: For all non-emergency after hours needs, please   call the office at [phone] and select the option to reach the on-call  service. On-call services are shared between multiple [place] offices and therefore it will not be possible to speak directly with your provider. On-call providers may provide medical advice and recommendations, but are unable to provide refills for maintenance medications.  For all emergency or urgent medical needs after normal business hours, we  recommend that you seek care at the closest Urgent Care or Emergency Department to ensure appropriate treatment in a timely manner.  [facility] has a 24 hour emergency room located on the ground floor for your convenience.   Urgent Concerns During the Business Day Providers are seeing patients from 8AM to 5PM with a busy schedule and are most often not able to respond to non-urgent calls until the end of the day or the next business day. If you should have URGENT concerns during the day, please call and speak to the nurse or schedule a same day appointment so that we can address your concern without delay.   Thank you, again, for choosing me as your health care partner. I appreciate your trust and look forward to learning more about you!   Miguel Christiana B. Sarahjane Matherly, DNP, FNP-C  ___________________________________________________________  Health Maintenance Recommendations Screening Testing Mammogram Every 1-2 years based on history and risk factors Starting at age 50 Pap Smear Ages 21-39 every 3 years Ages 30-65 every 5 years with HPV testing More frequent testing may be required based on results and history Colon Cancer Screening Every 1-10 years based on test performed, risk factors, and history Starting at age 45 Bone Density Screening Every 2-10 years based on history Starting at age 65 for women Recommendations for men differ based on medication usage, history, and risk factors AAA Screening One time ultrasound Men 65-75 years old who have ever smoked Lung Cancer Screening Low Dose Lung CT every 12 months Age 50-80 years with a 20 pack-year smoking history who still smoke or who have quit within the last 15 years  Screening Labs Routine  Labs: Complete Blood Count (CBC), Complete Metabolic Panel (CMP), Cholesterol (Lipid Panel) Every 6-12 months based on history and medications May be recommended more frequently based on current conditions or previous results Hemoglobin  A1c Lab Every 3-12 months based on history and previous results Starting at age 45 or earlier with diagnosis of diabetes, high cholesterol, BMI >26, and/or risk factors Frequent monitoring for patients with diabetes to ensure blood sugar control Thyroid Panel  Every 6 months based on history, symptoms, and risk factors May be repeated more often if on medication HIV One time testing for all patients 13 and older May be repeated more frequently for patients with increased risk factors or exposure Hepatitis C One time testing for all patients 18 and older May be repeated more frequently for patients with increased risk factors or exposure Gonorrhea, Chlamydia Every 12 months for all sexually active persons 13-24 years Additional monitoring may be recommended for those who are considered high risk or who have symptoms PSA Men 40-54 years old with risk factors Additional screening may be recommended from age 55-69 based on risk factors, symptoms, and history  Vaccine Recommendations Tetanus Booster All adults every 10 years Flu Vaccine All patients 6 months and older every year COVID Vaccine All patients 12 years and older Initial dosing with booster May recommend additional booster based on age and health history HPV Vaccine 2 doses all patients age 9-26 Dosing may be considered   for patients over 26 Shingles Vaccine (Shingrix) 2 doses all adults 50 years and older Pneumonia (Pneumovax 23) All adults 65 years and older May recommend earlier dosing based on health history Pneumonia (Prevnar 13) All adults 65 years and older Dosed 1 year after Pneumovax 23 Pneumonia (Prevnar 20) All adults 65 years and older (adults 19-64 with certain conditions or risk factors) 1 dose  For those who have not received Prevnar 13 vaccine previously   Additional Screening, Testing, and Vaccinations may be recommended on an individualized basis based on family history, health history, risk  factors, and/or exposure.  __________________________________________________________  Diet Recommendations for All Patients  I recommend that all patients maintain a diet low in saturated fats, carbohydrates, and cholesterol. While this can be challenging at first, it is not impossible and small changes can make big differences.  Things to try: Decreasing the amount of soda, sweet tea, and/or juice to one or less per day and replace with water While water is always the first choice, if you do not like water you may consider adding a water additive without sugar to improve the taste other sugar free drinks Replace potatoes with a brightly colored vegetable  Use healthy oils, such as canola oil or olive oil, instead of butter or hard margarine Limit your bread intake to two pieces or less a day Replace regular pasta with low carb pasta options Bake, broil, or grill foods instead of frying Monitor portion sizes  Eat smaller, more frequent meals throughout the day instead of large meals  An important thing to remember is, if you love foods that are not great for your health, you don't have to give them up completely. Instead, allow these foods to be a reward when you have done well. Allowing yourself to still have special treats every once in a while is a nice way to tell yourself thank you for working hard to keep yourself healthy.   Also remember that every day is a new day. If you have a bad day and "fall off the wagon", you can still climb right back up and keep moving along on your journey!  We have resources available to help you!  Some websites that may be helpful include: www.MyPlate.gov  Www.VeryWellFit.com _____________________________________________________________  Activity Recommendations for All Patients  I recommend that all adults get at least 20 minutes of moderate physical activity that elevates your heart rate at least 5 days out of the week.  Some examples  include: Walking or jogging at a pace that allows you to carry on a conversation Cycling (stationary bike or outdoors) Water aerobics Yoga Weight lifting Dancing If physical limitations prevent you from putting stress on your joints, exercise in a pool or seated in a chair are excellent options.  Do determine your MAXIMUM heart rate for activity: 220 - YOUR AGE = MAX Heart Rate   Remember! Do not push yourself too hard.  Start slowly and build up your pace, speed, weight, time in exercise, etc.  Allow your body to rest between exercise and get good sleep. You will need more water than normal when you are exerting yourself. Do not wait until you are thirsty to drink. Drink with a purpose of getting in at least 8, 8 ounce glasses of water a day plus more depending on how much you exercise and sweat.    If you begin to develop dizziness, chest pain, abdominal pain, jaw pain, shortness of breath, headache, vision changes, lightheadedness, or other concerning symptoms,   stop the activity and allow your body to rest. If your symptoms are severe, seek emergency evaluation immediately. If your symptoms are concerning, but not severe, please let us know so that we can recommend further evaluation.     

## 2023-04-30 NOTE — Assessment & Plan Note (Signed)
Sleep Apnea: Using CPAP machine but having difficulty with mask fit. -Continue follow-up with Pulmonology for mask adjustment after rental period ends.

## 2023-04-30 NOTE — Assessment & Plan Note (Signed)
Type 2 Diabetes Mellitus: Controlled with Glipizide 2.5mg  twice daily and Januvia 100mg  daily. No recent hypoglycemic episodes. -Check A1C and urine microalbumin today. -Refill Glipizide and Januvia prescriptions.

## 2023-05-01 ENCOUNTER — Other Ambulatory Visit (HOSPITAL_BASED_OUTPATIENT_CLINIC_OR_DEPARTMENT_OTHER): Payer: Self-pay

## 2023-05-01 LAB — MICROALBUMIN / CREATININE URINE RATIO
Creatinine,U: 179.9 mg/dL
Microalb Creat Ratio: 1.2 mg/g (ref 0.0–30.0)
Microalb, Ur: 2.1 mg/dL — ABNORMAL HIGH (ref 0.0–1.9)

## 2023-05-08 DIAGNOSIS — K080 Exfoliation of teeth due to systemic causes: Secondary | ICD-10-CM | POA: Diagnosis not present

## 2023-05-09 ENCOUNTER — Other Ambulatory Visit (HOSPITAL_BASED_OUTPATIENT_CLINIC_OR_DEPARTMENT_OTHER): Payer: Self-pay

## 2023-05-09 MED ORDER — LINACLOTIDE 72 MCG PO CAPS
72.0000 ug | ORAL_CAPSULE | Freq: Every day | ORAL | 3 refills | Status: DC
  Filled 2023-05-09 – 2023-05-10 (×2): qty 30, 30d supply, fill #0

## 2023-05-09 MED ORDER — METFORMIN HCL 500 MG PO TABS
500.0000 mg | ORAL_TABLET | Freq: Two times a day (BID) | ORAL | 1 refills | Status: DC
  Filled 2023-05-09 – 2023-05-10 (×2): qty 180, 90d supply, fill #0

## 2023-05-09 MED ORDER — SERTRALINE HCL 100 MG PO TABS
100.0000 mg | ORAL_TABLET | Freq: Every day | ORAL | 1 refills | Status: DC
  Filled 2023-05-09 – 2023-05-10 (×2): qty 90, 90d supply, fill #0

## 2023-05-09 MED ORDER — AMITRIPTYLINE HCL 10 MG PO TABS
10.0000 mg | ORAL_TABLET | Freq: Every evening | ORAL | 1 refills | Status: DC
  Filled 2023-05-09 – 2023-05-10 (×2): qty 30, 30d supply, fill #0

## 2023-05-10 ENCOUNTER — Other Ambulatory Visit: Payer: Self-pay

## 2023-05-10 ENCOUNTER — Other Ambulatory Visit (HOSPITAL_BASED_OUTPATIENT_CLINIC_OR_DEPARTMENT_OTHER): Payer: Self-pay

## 2023-05-14 DIAGNOSIS — L821 Other seborrheic keratosis: Secondary | ICD-10-CM | POA: Diagnosis not present

## 2023-05-14 DIAGNOSIS — L570 Actinic keratosis: Secondary | ICD-10-CM | POA: Diagnosis not present

## 2023-05-14 DIAGNOSIS — D2262 Melanocytic nevi of left upper limb, including shoulder: Secondary | ICD-10-CM | POA: Diagnosis not present

## 2023-05-14 DIAGNOSIS — D2261 Melanocytic nevi of right upper limb, including shoulder: Secondary | ICD-10-CM | POA: Diagnosis not present

## 2023-05-22 ENCOUNTER — Ambulatory Visit (INDEPENDENT_AMBULATORY_CARE_PROVIDER_SITE_OTHER): Payer: Medicare Other

## 2023-05-22 DIAGNOSIS — E538 Deficiency of other specified B group vitamins: Secondary | ICD-10-CM | POA: Diagnosis not present

## 2023-05-22 MED ORDER — CYANOCOBALAMIN 1000 MCG/ML IJ SOLN
1000.0000 ug | Freq: Once | INTRAMUSCULAR | Status: AC
Start: 2023-05-22 — End: 2023-05-22
  Administered 2023-05-22: 1000 ug via INTRAMUSCULAR

## 2023-05-22 NOTE — Progress Notes (Signed)
Diana George is a 77 y.o. female presents to the office today for Monthly B12 injection, per physician's orders. Original order: 04/30/23:  B12 deficiency       Vitamin B12 Deficiency: On monthly B12 injections. Last injection was on 04/19/2023. -Check B12 level today. -Continue monthly B12 injections at this clinic.     Cyanocobalamin 1000 mg/ml IM was administered R deltoid today.  Patient tolerated injection. Patient due for follow up labs/provider appt: No. Patient next injection due: 1 month, appt made Yes  Creft, Melton Alar L

## 2023-05-24 ENCOUNTER — Ambulatory Visit: Payer: Medicare Other | Admitting: Internal Medicine

## 2023-05-24 ENCOUNTER — Ambulatory Visit (INDEPENDENT_AMBULATORY_CARE_PROVIDER_SITE_OTHER): Payer: Medicare Other | Admitting: *Deleted

## 2023-05-24 VITALS — BP 94/59 | HR 81 | Ht 66.0 in | Wt 197.0 lb

## 2023-05-24 DIAGNOSIS — Z0000 Encounter for general adult medical examination without abnormal findings: Secondary | ICD-10-CM | POA: Diagnosis not present

## 2023-05-24 NOTE — Patient Instructions (Signed)
Diana George , Thank you for taking time to come for your Medicare Wellness Visit. I appreciate your ongoing commitment to your health goals. Please review the following plan we discussed and let me know if I can assist you in the future.   These are the goals we discussed:  Goals   None     This is a list of the screening recommended for you and due dates:  Health Maintenance  Topic Date Due   Complete foot exam   10/11/2019   Eye exam for diabetics  09/27/2023   Hemoglobin A1C  10/31/2023   Yearly kidney function blood test for diabetes  04/29/2024   Yearly kidney health urinalysis for diabetes  04/29/2024   Medicare Annual Wellness Visit  05/23/2024   Pneumonia Vaccine  Completed   DEXA scan (bone density measurement)  Completed   Zoster (Shingles) Vaccine  Completed   HPV Vaccine  Aged Out   DTaP/Tdap/Td vaccine  Discontinued   Flu Shot  Discontinued   Colon Cancer Screening  Discontinued   COVID-19 Vaccine  Discontinued   Hepatitis C Screening  Discontinued     Next appointment: Follow up in one year for your annual wellness visit.   Preventive Care 28 Years and Older, Female Preventive care refers to lifestyle choices and visits with your health care provider that can promote health and wellness. What does preventive care include? A yearly physical exam. This is also called an annual well check. Dental exams once or twice a year. Routine eye exams. Ask your health care provider how often you should have your eyes checked. Personal lifestyle choices, including: Daily care of your teeth and gums. Regular physical activity. Eating a healthy diet. Avoiding tobacco and drug use. Limiting alcohol use. Practicing safe sex. Taking low-dose aspirin every day. Taking vitamin and mineral supplements as recommended by your health care provider. What happens during an annual well check? The services and screenings done by your health care provider during your annual well check  will depend on your age, overall health, lifestyle risk factors, and family history of disease. Counseling  Your health care provider may ask you questions about your: Alcohol use. Tobacco use. Drug use. Emotional well-being. Home and relationship well-being. Sexual activity. Eating habits. History of falls. Memory and ability to understand (cognition). Work and work Astronomer. Reproductive health. Screening  You may have the following tests or measurements: Height, weight, and BMI. Blood pressure. Lipid and cholesterol levels. These may be checked every 5 years, or more frequently if you are over 69 years old. Skin check. Lung cancer screening. You may have this screening every year starting at age 97 if you have a 30-pack-year history of smoking and currently smoke or have quit within the past 15 years. Fecal occult blood test (FOBT) of the stool. You may have this test every year starting at age 71. Flexible sigmoidoscopy or colonoscopy. You may have a sigmoidoscopy every 5 years or a colonoscopy every 10 years starting at age 23. Hepatitis C blood test. Hepatitis B blood test. Sexually transmitted disease (STD) testing. Diabetes screening. This is done by checking your blood sugar (glucose) after you have not eaten for a while (fasting). You may have this done every 1-3 years. Bone density scan. This is done to screen for osteoporosis. You may have this done starting at age 42. Mammogram. This may be done every 1-2 years. Talk to your health care provider about how often you should have regular mammograms. Talk with  your health care provider about your test results, treatment options, and if necessary, the need for more tests. Vaccines  Your health care provider may recommend certain vaccines, such as: Influenza vaccine. This is recommended every year. Tetanus, diphtheria, and acellular pertussis (Tdap, Td) vaccine. You may need a Td booster every 10 years. Zoster vaccine. You  may need this after age 34. Pneumococcal 13-valent conjugate (PCV13) vaccine. One dose is recommended after age 51. Pneumococcal polysaccharide (PPSV23) vaccine. One dose is recommended after age 30. Talk to your health care provider about which screenings and vaccines you need and how often you need them. This information is not intended to replace advice given to you by your health care provider. Make sure you discuss any questions you have with your health care provider. Document Released: 11/12/2015 Document Revised: 07/05/2016 Document Reviewed: 08/17/2015 Elsevier Interactive Patient Education  2017 [employer].  Fall Prevention in the Home Falls can cause injuries. They can happen to people of all ages. There are many things you can do to make your home safe and to help prevent falls. What can I do on the outside of my home? Regularly fix the edges of walkways and driveways and fix any cracks. Remove anything that might make you trip as you walk through a door, such as a raised step or threshold. Trim any bushes or trees on the path to your home. Use bright outdoor lighting. Clear any walking paths of anything that might make someone trip, such as rocks or tools. Regularly check to see if handrails are loose or broken. Make sure that both sides of any steps have handrails. Any raised decks and porches should have guardrails on the edges. Have any leaves, snow, or ice cleared regularly. Use sand or salt on walking paths during winter. Clean up any spills in your garage right away. This includes oil or grease spills. What can I do in the bathroom? Use night lights. Install grab bars by the toilet and in the tub and shower. Do not use towel bars as grab bars. Use non-skid mats or decals in the tub or shower. If you need to sit down in the shower, use a plastic, non-slip stool. Keep the floor dry. Clean up any water that spills on the floor as soon as it happens. Remove soap buildup  in the tub or shower regularly. Attach bath mats securely with double-sided non-slip rug tape. Do not have throw rugs and other things on the floor that can make you trip. What can I do in the bedroom? Use night lights. Make sure that you have a light by your bed that is easy to reach. Do not use any sheets or blankets that are too big for your bed. They should not hang down onto the floor. Have a firm chair that has side arms. You can use this for support while you get dressed. Do not have throw rugs and other things on the floor that can make you trip. What can I do in the kitchen? Clean up any spills right away. Avoid walking on wet floors. Keep items that you use a lot in easy-to-reach places. If you need to reach something above you, use a strong step stool that has a grab bar. Keep electrical cords out of the way. Do not use floor polish or wax that makes floors slippery. If you must use wax, use non-skid floor wax. Do not have throw rugs and other things on the floor that can make you trip.  What can I do with my stairs? Do not leave any items on the stairs. Make sure that there are handrails on both sides of the stairs and use them. Fix handrails that are broken or loose. Make sure that handrails are as long as the stairways. Check any carpeting to make sure that it is firmly attached to the stairs. Fix any carpet that is loose or worn. Avoid having throw rugs at the top or bottom of the stairs. If you do have throw rugs, attach them to the floor with carpet tape. Make sure that you have a light switch at the top of the stairs and the bottom of the stairs. If you do not have them, ask someone to add them for you. What else can I do to help prevent falls? Wear shoes that: Do not have high heels. Have rubber bottoms. Are comfortable and fit you well. Are closed at the toe. Do not wear sandals. If you use a stepladder: Make sure that it is fully opened. Do not climb a closed  stepladder. Make sure that both sides of the stepladder are locked into place. Ask someone to hold it for you, if possible. Clearly mark and make sure that you can see: Any grab bars or handrails. First and last steps. Where the edge of each step is. Use tools that help you move around (mobility aids) if they are needed. These include: Canes. Walkers. Scooters. Crutches. Turn on the lights when you go into a dark area. Replace any light bulbs as soon as they burn out. Set up your furniture so you have a clear path. Avoid moving your furniture around. If any of your floors are uneven, fix them. If there are any pets around you, be aware of where they are. Review your medicines with your doctor. Some medicines can make you feel dizzy. This can increase your chance of falling. Ask your doctor what other things that you can do to help prevent falls. This information is not intended to replace advice given to you by your health care provider. Make sure you discuss any questions you have with your health care provider. Document Released: 08/12/2009 Document Revised: 03/23/2016 Document Reviewed: 11/20/2014 Elsevier Interactive Patient Education  2017 [employer].

## 2023-05-24 NOTE — Progress Notes (Signed)
Subjective:   Diana George is a 77 y.o. female who presents for Medicare Annual (Subsequent) preventive examination.  Visit Complete: In person  Review of Systems     Cardiac Risk Factors include: diabetes mellitus;obesity (BMI >30kg/m2);dyslipidemia     Objective:    Today's Vitals   05/24/23 1446  BP: (!) 94/59  Pulse: 81  Weight: 197 lb (89.4 kg)  Height: 5\' 6"  (1.676 m)   Body mass index is 31.8 kg/m.     05/24/2023    3:11 PM 05/22/2022   10:09 AM 10/08/2020    9:19 AM 03/11/2020    2:49 PM 06/28/2015   10:57 AM 03/26/2015    9:52 AM 12/21/2014    6:39 AM  Advanced Directives  Does Patient Have a Medical Advance Directive? Yes Yes Yes Yes Yes Yes Yes  Type of Estate agent of [place];Living will Healthcare Power of [place];Living will Healthcare Power of [place];Living will Healthcare Power of [place];Living will Healthcare Power of [place];Living will Living will;Healthcare Power of [employer] Power of [place];Living will  Does patient want to make changes to medical advance directive? No - Patient declined No - Patient declined  No - Patient declined No - Patient declined    Copy of Healthcare Power of Attorney in Chart? No - copy requested No - copy requested No - copy requested No - copy requested   No - copy requested    Current Medications (verified) Outpatient Encounter Medications as of 05/24/2023  Medication Sig   Cholecalciferol (VITAMIN D) 2000 UNITS CAPS Take 1 capsule by mouth 2 (two) times daily.   CYANOCOBALAMIN IJ Inject as directed every 30 (thirty) days.   glipiZIDE (GLUCOTROL) 5 MG tablet Take 0.5 tablets (2.5 mg total) by mouth 2 (two) times daily with breakfast and lunch.   hyoscyamine (LEVSIN) 0.125 MG tablet Take 0.125 mg by mouth as needed.   propranolol (INDERAL) 20 MG tablet Take 1 tablet (20 mg total) by mouth 2 (two) times daily.   sertraline (ZOLOFT) 100 MG tablet Take 1 tablet (100 mg total) by mouth  daily.   simvastatin (ZOCOR) 20 MG tablet TAKE 1 TABLET BY MOUTH DAILY   sitaGLIPtin (JANUVIA) 100 MG tablet Take 1 tablet (100 mg total) by mouth daily.   [DISCONTINUED] amitriptyline (ELAVIL) 10 MG tablet Take 1 tablet (10 mg total) by mouth at bedtime.   [DISCONTINUED] linaclotide (LINZESS) 72 MCG capsule Take 1 capsule (72 mcg total) by mouth daily before breakfast.   [DISCONTINUED] metFORMIN (GLUCOPHAGE) 500 MG tablet Take 1 tablet (500 mg total) by mouth 2 (two) times daily.   [DISCONTINUED] sertraline (ZOLOFT) 100 MG tablet TAKE 1 TABLET BY MOUTH DAILY GENERIC EQUIVALENT FOR ZOLOFT   No facility-administered encounter medications on file as of 05/24/2023.    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   Hyperlipidemia    Renal stone    Sleep apnea    uses cpap   Past Surgical History:  Procedure Laterality Date   CATARACT EXTRACTION W/ INTRAOCULAR LENS  IMPLANT, BILATERAL  2010   heart ablation  2010   LEG SURGERY Left 2011   fracture with hardware   STONE EXTRACTION WITH BASKET  2016, 1977   TUBAL LIGATION  1977   Family History  Problem Relation Age of Onset  Alzheimer's disease Mother    Alzheimer's disease Father    Heart disease Father    Colon cancer Father 15   Tremor Father    Social History   Socioeconomic History   Marital status: Divorced    Spouse name: Not on file   Number of children: Not on file   Years of education: Not on file   Highest education level: Not on file  Occupational History   Occupation: retired  Tobacco Use   Smoking status: Never   Smokeless tobacco: Never  Vaping Use   Vaping status: Never Used  Substance and Sexual Activity   Alcohol use: No    Alcohol/week: 0.0 standard drinks of alcohol   Drug use: No   Sexual activity: Not on file  Other Topics Concern    Not on file  Social History Narrative   Not on file   Social Determinants of Health   Financial Resource Strain: Low Risk  (05/24/2023)   Overall Financial Resource Strain (CARDIA)    Difficulty of Paying Living Expenses: Not hard at all  Food Insecurity: No Food Insecurity (05/24/2023)   Hunger Vital Sign    Worried About Running Out of Food in the Last Year: Never true    Ran Out of Food in the Last Year: Never true  Transportation Needs: No Transportation Needs (05/24/2023)   PRAPARE - Administrator, Civil Service (Medical): No    Lack of Transportation (Non-Medical): No  Physical Activity: Inactive (05/24/2023)   Exercise Vital Sign    Days of Exercise per Week: 0 days    Minutes of Exercise per Session: 0 min  Stress: No Stress Concern Present (05/24/2023)   [employer] of Occupational Health - Occupational Stress Questionnaire    Feeling of Stress : Only a little  Social Connections: Moderately Integrated (05/24/2023)   Social Connection and Isolation Panel [NHANES]    Frequency of Communication with Friends and Family: More than three times a week    Frequency of Social Gatherings with Friends and Family: Twice a week    Attends Religious Services: More than 4 times per year    Active Member of [employer] or Organizations: Yes    Attends Engineer, structural: More than 4 times per year    Marital Status: Divorced    Tobacco Counseling Counseling given: Not Answered   Clinical Intake:  Pre-visit preparation completed: Yes  Pain : No/denies pain   BMI - recorded: 31.8 Nutritional Status: BMI > 30  Obese Nutritional Risks: None Diabetes: Yes CBG done?: No Did pt. bring in CBG monitor from home?: No  How often do you need to have someone help you when you read instructions, pamphlets, or other written materials from your doctor or pharmacy?: 1 - Never  Interpreter Needed?: No  Information entered by :: Donne Anon, CMA   Activities of  Daily Living    05/24/2023    2:53 PM  In your present state of health, do you have any difficulty performing the following activities:  Hearing? 0  Vision? 0  Difficulty concentrating or making decisions? 1  Comment "sometimes"  Walking or climbing stairs? 0  Dressing or bathing? 0  Doing errands, shopping? 0  Preparing Food and eating ? N  Using the Toilet? N  In the past six months, have you accidently leaked urine? Y  Do you have problems with loss of bowel control? N  Managing your Medications? N  Managing your Finances? N  Housekeeping or managing your Housekeeping? N    Patient Care Team: Clayborne Dana, NP as PCP - General (Family Medicine)  Indicate any recent Medical Services you may have received from other than [facility] providers in the past year (date may be approximate).     Assessment:   This is a routine wellness examination for Diana George.  Hearing/Vision screen No results found.  Dietary issues and exercise activities discussed:     Goals Addressed   None    Depression Screen    05/24/2023    3:07 PM 04/30/2023    1:40 PM 02/13/2023    1:07 PM 01/03/2023   12:19 PM 12/25/2022   11:49 AM 11/08/2022   12:06 PM 10/11/2022   12:33 PM  PHQ 2/9 Scores  PHQ - 2 Score 0 2 2 2 2  0 0  PHQ- 9 Score  10 7 5 5       Fall Risk    05/24/2023    3:00 PM 04/30/2023    1:40 PM 02/13/2023    1:07 PM 01/03/2023   12:19 PM 12/25/2022   11:49 AM  Fall Risk   Falls in the past year? 1 1 1  0 0  Comment missed the step from getting out of a high bed      Number falls in past yr: 0 1 0 0 0  Injury with Fall? 0 0 0 0 0  Risk for fall due to : History of fall(s) History of fall(s) No Fall Risks No Fall Risks No Fall Risks  Follow up Falls evaluation completed Falls evaluation completed Falls prevention discussed Falls prevention discussed Falls prevention discussed    MEDICARE RISK AT HOME:  Medicare Risk at Home - 05/24/23 1458     Any stairs in or around the home? Yes    If  so, are there any without handrails? No    Home free of loose throw rugs in walkways, pet beds, electrical cords, etc? No    Adequate lighting in your home to reduce risk of falls? Yes    Life alert? No    Use of a cane, walker or w/c? No    Grab bars in the bathroom? Yes    Shower chair or bench in shower? Yes   built-in seat   Elevated toilet seat or a handicapped toilet? Yes             TIMED UP AND GO:  Was the test performed?  Yes  Length of time to ambulate 10 feet: 6 sec Gait steady and fast without use of assistive device    Cognitive Function:        05/24/2023    3:13 PM 05/22/2022   10:12 AM  6CIT Screen  What Year? 0 points   What month? 0 points   What time? 0 points 0 points  Count back from 20 0 points 0 points  Months in reverse 0 points 0 points  Repeat phrase 0 points 4 points  Total Score 0 points     Immunizations Immunization History  Administered Date(s) Administered   Influenza Split 08/09/2012, 08/30/2016   Influenza,inj,Quad PF,6+ Mos 08/27/2013, 08/19/2014, 08/06/2015, 08/07/2017, 08/16/2018, 07/09/2019, 08/04/2020, 08/22/2021, 12/06/2022   PFIZER(Purple Top)SARS-COV-2 Vaccination 11/21/2019, 12/12/2019, 08/19/2020, 05/26/2021   Pneumococcal Conjugate-13 02/03/2016   Pneumococcal Polysaccharide-23 11/13/2009, 10/11/2017   Tdap 10/27/2011   Zoster Recombinant(Shingrix) 11/13/2018, 05/21/2020   Zoster, Live 12/06/2010    TDAP status: Up to date  Flu Vaccine status: Up to date  Pneumococcal vaccine status: Up to date  Covid-19 vaccine status: Declined, Education has been provided regarding the importance of this vaccine but patient still declined. Advised may receive this vaccine at local pharmacy or Health Dept.or vaccine clinic. Aware to provide a copy of the vaccination record if obtained from local pharmacy or Health Dept. Verbalized acceptance and understanding.  Qualifies for Shingles Vaccine? Yes   Zostavax completed Yes    Shingrix Completed?: Yes  Screening Tests Health Maintenance  Topic Date Due   FOOT EXAM  10/11/2019   Medicare Annual Wellness (AWV)  05/23/2023   OPHTHALMOLOGY EXAM  09/27/2023   HEMOGLOBIN A1C  10/31/2023   Diabetic kidney evaluation - eGFR measurement  04/29/2024   Diabetic kidney evaluation - Urine ACR  04/29/2024   Pneumonia Vaccine 42+ Years old  Completed   DEXA SCAN  Completed   Zoster Vaccines- Shingrix  Completed   HPV VACCINES  Aged Out   DTaP/Tdap/Td  Discontinued   INFLUENZA VACCINE  Discontinued   Colonoscopy  Discontinued   COVID-19 Vaccine  Discontinued   Hepatitis C Screening  Discontinued    Health Maintenance  Health Maintenance Due  Topic Date Due   FOOT EXAM  10/11/2019   Medicare Annual Wellness (AWV)  05/23/2023    Colorectal cancer screening: No longer required.   Mammogram status: Completed 2022. Repeat every year  Bone Density status: Completed 06/28/20. Results reflect: Bone density results: OSTEOPENIA. Repeat every 2 years.  Lung Cancer Screening: (Low Dose CT Chest recommended if Age 79-80 years, 20 pack-year currently smoking OR have quit w/in 15years.) does not qualify.   Additional Screening:  Hepatitis C Screening: does qualify; Completed N/a  Vision Screening: Recommended annual ophthalmology exams for early detection of glaucoma and other disorders of the eye. Is the patient up to date with their annual eye exam?  Yes  Who is the provider or what is the name of the office in which the patient attends annual eye exams? [facility] If pt is not established with a provider, would they like to be referred to a provider to establish care? No .   Dental Screening: Recommended annual dental exams for proper oral hygiene  Diabetic Foot Exam: Diabetic Foot Exam: Overdue, Pt has been advised about the importance in completing this exam. Pt is scheduled for diabetic foot exam on N/a.  Community Resource Referral / Chronic Care  Management: CRR required this visit?  No   CCM required this visit?  No     Plan:     I have personally reviewed and noted the following in the patient's chart:   Medical and social history Use of alcohol, tobacco or illicit drugs  Current medications and supplements including opioid prescriptions. Patient is not currently taking opioid prescriptions. Functional ability and status Nutritional status Physical activity Advanced directives List of other physicians Hospitalizations, surgeries, and ER visits in previous 12 months Vitals Screenings to include cognitive, depression, and falls Referrals and appointments  In addition, I have reviewed and discussed with patient certain preventive protocols, quality metrics, and best practice recommendations. A written personalized care plan for preventive services as well as general preventive health recommendations were provided to patient.     Donne Anon, CMA   05/24/2023   After Visit Summary: Sent to mychart.  Nurse Notes: None

## 2023-05-30 ENCOUNTER — Encounter (INDEPENDENT_AMBULATORY_CARE_PROVIDER_SITE_OTHER): Payer: Self-pay

## 2023-06-04 ENCOUNTER — Other Ambulatory Visit (HOSPITAL_BASED_OUTPATIENT_CLINIC_OR_DEPARTMENT_OTHER): Payer: Self-pay

## 2023-06-04 DIAGNOSIS — F3341 Major depressive disorder, recurrent, in partial remission: Secondary | ICD-10-CM | POA: Diagnosis not present

## 2023-06-04 MED ORDER — SERTRALINE HCL 100 MG PO TABS
100.0000 mg | ORAL_TABLET | Freq: Every day | ORAL | 1 refills | Status: DC
  Filled 2023-10-01: qty 90, 90d supply, fill #0
  Filled 2023-12-31: qty 90, 90d supply, fill #1

## 2023-06-27 ENCOUNTER — Ambulatory Visit: Payer: Medicare Other

## 2023-06-28 ENCOUNTER — Ambulatory Visit (INDEPENDENT_AMBULATORY_CARE_PROVIDER_SITE_OTHER): Payer: Medicare Other

## 2023-06-28 DIAGNOSIS — E538 Deficiency of other specified B group vitamins: Secondary | ICD-10-CM | POA: Diagnosis not present

## 2023-06-28 MED ORDER — CYANOCOBALAMIN 1000 MCG/ML IJ SOLN
1000.0000 ug | Freq: Once | INTRAMUSCULAR | Status: AC
Start: 2023-06-28 — End: 2023-06-28
  Administered 2023-06-28: 1000 ug via INTRAMUSCULAR

## 2023-06-28 NOTE — Progress Notes (Signed)
Diana George is a 77 y.o. female presents to the office today for B12 injections, per physician's orders. Original order: riginal order: 04/30/23:per provider      B12 deficiency         Vitamin B12 Deficiency: On monthly B12 injections.  -Continue monthly B12 injections at this clinic.     Cyanocobalamin (med), 1000 mg/ml (dose),  IM (route) was administered left deltoid (location) today. Patient tolerated injection. Patient next injection due: in one month, appt made Yes.  [place]

## 2023-07-09 ENCOUNTER — Other Ambulatory Visit: Payer: Self-pay | Admitting: Adult Health

## 2023-07-09 ENCOUNTER — Other Ambulatory Visit: Payer: Self-pay | Admitting: Family Medicine

## 2023-07-09 ENCOUNTER — Other Ambulatory Visit (HOSPITAL_BASED_OUTPATIENT_CLINIC_OR_DEPARTMENT_OTHER): Payer: Self-pay

## 2023-07-09 DIAGNOSIS — E1165 Type 2 diabetes mellitus with hyperglycemia: Secondary | ICD-10-CM

## 2023-07-09 DIAGNOSIS — G250 Essential tremor: Secondary | ICD-10-CM

## 2023-07-09 MED ORDER — GLIPIZIDE 5 MG PO TABS
2.5000 mg | ORAL_TABLET | Freq: Two times a day (BID) | ORAL | 0 refills | Status: DC
Start: 2023-07-09 — End: 2023-11-05
  Filled 2023-07-09 – 2023-08-07 (×2): qty 90, 90d supply, fill #0

## 2023-07-09 MED FILL — Simvastatin Tab 20 MG: ORAL | 90 days supply | Qty: 90 | Fill #0 | Status: AC

## 2023-07-11 ENCOUNTER — Other Ambulatory Visit (HOSPITAL_BASED_OUTPATIENT_CLINIC_OR_DEPARTMENT_OTHER): Payer: Self-pay

## 2023-07-12 ENCOUNTER — Other Ambulatory Visit (HOSPITAL_BASED_OUTPATIENT_CLINIC_OR_DEPARTMENT_OTHER): Payer: Self-pay

## 2023-07-13 ENCOUNTER — Other Ambulatory Visit (HOSPITAL_BASED_OUTPATIENT_CLINIC_OR_DEPARTMENT_OTHER): Payer: Self-pay

## 2023-07-13 ENCOUNTER — Telehealth: Payer: Self-pay | Admitting: Family Medicine

## 2023-07-13 DIAGNOSIS — G250 Essential tremor: Secondary | ICD-10-CM

## 2023-07-13 MED ORDER — PROPRANOLOL HCL 20 MG PO TABS
20.0000 mg | ORAL_TABLET | Freq: Two times a day (BID) | ORAL | 0 refills | Status: DC
  Filled 2023-07-13 (×2): qty 180, 90d supply, fill #0

## 2023-07-13 NOTE — Telephone Encounter (Signed)
Prescription Request  07/13/2023  Is this a "Controlled Substance" medicine? No  LOV: 04/30/2023  What is the name of the medication or equipment?   Rx #: [id number]  glipiZIDE (GLUCOTROL) 5 MG tablet [[id number]]   propranolol (INDERAL) 20 MG tablet [[id number]]  Have you contacted your pharmacy to request a refill? No   Which pharmacy would you like this sent to?   [facility] [address] [place] [address] Phone: [phone] Fax: [phone]    Patient notified that their request is being sent to the clinical staff for review and that they should receive a response within 2 business days.   Please advise at Mobile [phone] (mobile)

## 2023-07-13 NOTE — Addendum Note (Signed)
Addended bySilvio Pate on: 07/13/2023 11:51 AM   Modules accepted: Orders

## 2023-07-13 NOTE — Telephone Encounter (Signed)
Patient made aware RXs sent.

## 2023-07-16 ENCOUNTER — Other Ambulatory Visit (HOSPITAL_BASED_OUTPATIENT_CLINIC_OR_DEPARTMENT_OTHER): Payer: Self-pay

## 2023-07-31 ENCOUNTER — Encounter: Payer: Self-pay | Admitting: Family Medicine

## 2023-07-31 ENCOUNTER — Ambulatory Visit (INDEPENDENT_AMBULATORY_CARE_PROVIDER_SITE_OTHER): Payer: Medicare Other | Admitting: Family Medicine

## 2023-07-31 VITALS — BP 101/64 | HR 76 | Ht 66.0 in | Wt 202.0 lb

## 2023-07-31 DIAGNOSIS — E7849 Other hyperlipidemia: Secondary | ICD-10-CM

## 2023-07-31 DIAGNOSIS — E1165 Type 2 diabetes mellitus with hyperglycemia: Secondary | ICD-10-CM | POA: Diagnosis not present

## 2023-07-31 DIAGNOSIS — Z7984 Long term (current) use of oral hypoglycemic drugs: Secondary | ICD-10-CM

## 2023-07-31 DIAGNOSIS — E538 Deficiency of other specified B group vitamins: Secondary | ICD-10-CM | POA: Diagnosis not present

## 2023-07-31 DIAGNOSIS — M064 Inflammatory polyarthropathy: Secondary | ICD-10-CM | POA: Insufficient documentation

## 2023-07-31 LAB — COMPREHENSIVE METABOLIC PANEL
ALT: 16 U/L (ref 0–35)
AST: 16 U/L (ref 0–37)
Albumin: 4 g/dL (ref 3.5–5.2)
Alkaline Phosphatase: 83 U/L (ref 39–117)
BUN: 17 mg/dL (ref 6–23)
CO2: 24 meq/L (ref 19–32)
Calcium: 9.2 mg/dL (ref 8.4–10.5)
Chloride: 108 meq/L (ref 96–112)
Creatinine, Ser: 1.1 mg/dL (ref 0.40–1.20)
GFR: 48.69 mL/min — ABNORMAL LOW (ref >60.00)
Glucose, Bld: 135 mg/dL — ABNORMAL HIGH (ref 70–99)
Potassium: 4.1 meq/L (ref 3.5–5.1)
Sodium: 140 meq/L (ref 135–145)
Total Bilirubin: 0.7 mg/dL (ref 0.2–1.2)
Total Protein: 6.3 g/dL (ref 6.0–8.3)

## 2023-07-31 LAB — LIPID PANEL
Cholesterol: 152 mg/dL (ref 0–200)
HDL: 47.4 mg/dL (ref >39.00)
LDL Cholesterol: 83 mg/dL (ref 0–99)
NonHDL: 104.5
Total CHOL/HDL Ratio: 3
Triglycerides: 107 mg/dL (ref 0.0–149.0)
VLDL: 21.4 mg/dL (ref 0.0–40.0)

## 2023-07-31 LAB — HEMOGLOBIN A1C: Hgb A1c MFr Bld: 6.9 % — ABNORMAL HIGH (ref 4.6–6.5)

## 2023-07-31 MED ORDER — CYANOCOBALAMIN 1000 MCG/ML IJ SOLN
1000.0000 ug | Freq: Once | INTRAMUSCULAR | Status: AC
Start: 2023-07-31 — End: 2023-07-31
  Administered 2023-07-31: 1000 ug via INTRAMUSCULAR

## 2023-07-31 MED ORDER — CYANOCOBALAMIN 1000 MCG/ML IJ SOLN
1000.0000 ug | Freq: Once | INTRAMUSCULAR | Status: DC
Start: 2023-07-31 — End: 2023-07-31

## 2023-07-31 NOTE — Addendum Note (Signed)
Addended bySilvio Pate on: 07/31/2023 11:17 AM   Modules accepted: Orders

## 2023-07-31 NOTE — Assessment & Plan Note (Signed)
-  Continue monthly B12 injections here

## 2023-07-31 NOTE — Assessment & Plan Note (Signed)
Medication management: simvastatin 20 mg daily  Lifestyle factors for lowering cholesterol include: Diet therapy - heart-healthy diet rich in fruits, veggies, fiber-rich whole grains, lean meats, chicken, fish (at least twice a week), fat-free or 1% dairy products; foods low in saturated/trans fats, cholesterol, sodium, and sugar. Mediterranean diet has shown to be very heart healthy. Regular exercise - recommend at least 30 minutes a day, 5 times per week Weight management  Repeat CMP and lipid panel today

## 2023-07-31 NOTE — Progress Notes (Signed)
Established Patient Office Visit  Subjective   Patient ID: Diana George, female    DOB: 09/05/1946  Age: 77 y.o. MRN: [id number]  Chief Complaint  Patient presents with   Medical Management of Chronic Issues    HPI  Patient is here for routine follow-up for chronic disease management. States she has been doing well overall but has not been consistent with diet/exercise and has noticed gaining a few pounds since our last visit.   Diabetes: - Checking glucose at home: occasionally  - Medications: glipizide 2.5 mg BID, Januvia 100 mg daily  - Compliance: good - Diet: general - Exercise: none - Eye exam: scheduled for next month  - Foot exam: today - Microalbumin: UTD - Denies symptoms of hypoglycemia, polyuria, polydipsia, numbness extremities, foot ulcers/trauma, wounds that are not healing, medication side effects  Lab Results  Component Value Date   HGBA1C 6.8 (H) 04/30/2023     Hyperlipidemia: - medications: simvastatin 20 mg daily - compliance: good - medication SEs: no The 10-year ASCVD risk score (Arnett DK, et al., 2019) is: 20.9%   Values used to calculate the score:     Age: 73 years     Sex: Female     Is Non-Hispanic African American: No     Diabetic: Yes     Tobacco smoker: No     Systolic Blood Pressure: 101 mmHg     Is BP treated: No     HDL Cholesterol: 56 mg/dL     Total Cholesterol: 142 mg/dL   Vitamin W09 deficiency: - monthly injections, due today  - doing well, no concerns  Mood follow-up: - Diagnosis: anxiety and depression  - Treatment: Zoloft 100 mg daily  - Medication side effects: none - SI/HI: no - Update: doing well, no new concerns    Tremors: - following with neurology - propranolol 20 mg BID   OSA: - following with pulmonology - CPAP at night       ROS All review of systems negative except what is listed in the HPI    Objective:     BP 101/64   Pulse 76   Ht 5\' 6"  (1.676 m)   Wt 202 lb (91.6 kg)    SpO2 96%   BMI 32.60 kg/m    Physical Exam Vitals reviewed.  Constitutional:      Appearance: Normal appearance.  Cardiovascular:     Rate and Rhythm: Normal rate and regular rhythm.     Pulses: Normal pulses.     Heart sounds: Normal heart sounds.  Pulmonary:     Effort: Pulmonary effort is normal.     Breath sounds: Normal breath sounds.  Skin:    General: Skin is warm and dry.  Neurological:     Mental Status: She is alert and oriented to person, place, and time.  Psychiatric:        Mood and Affect: Mood normal.        Behavior: Behavior normal.        Thought Content: Thought content normal.        Judgment: Judgment normal.    Diabetic Foot Exam - Simple   Simple Foot Form Diabetic Foot exam was performed with the following findings: Yes 07/31/2023 11:01 AM  Visual Inspection No deformities, no ulcerations, no other skin breakdown bilaterally: Yes Sensation Testing Intact to touch and monofilament testing bilaterally: Yes Pulse Check Posterior Tibialis and Dorsalis pulse intact bilaterally: Yes Comments       No  results found for any visits on 07/31/23.    The 10-year ASCVD risk score (Arnett DK, et al., 2019) is: 20.9%    Assessment & Plan:   Problem List Items Addressed This Visit       Active Problems   Hyperlipidemia    Medication management: simvastatin 20 mg daily  Lifestyle factors for lowering cholesterol include: Diet therapy - heart-healthy diet rich in fruits, veggies, fiber-rich whole grains, lean meats, chicken, fish (at least twice a week), fat-free or 1% dairy products; foods low in saturated/trans fats, cholesterol, sodium, and sugar. Mediterranean diet has shown to be very heart healthy. Regular exercise - recommend at least 30 minutes a day, 5 times per week Weight management  Repeat CMP and lipid panel today       Relevant Orders   Comprehensive metabolic panel   Lipid panel   Diabetes mellitus (HCC) - Primary    Type 2  Diabetes Mellitus: Controlled with Glipizide 2.5mg  twice daily and Januvia 100mg  daily. No recent hypoglycemic episodes. -Check A1C today  -No refills needed today      Relevant Orders   HgB A1c   B12 deficiency    -Continue monthly B12 injections here      Relevant Medications   cyanocobalamin (VITAMIN B12) injection 1,000 mcg (Start on 07/31/2023 11:15 AM)    Return in about 6 months (around 01/29/2024) for routine follow-up.    Clayborne Dana, NP

## 2023-07-31 NOTE — Assessment & Plan Note (Signed)
Type 2 Diabetes Mellitus: Controlled with Glipizide 2.5mg  twice daily and Januvia 100mg  daily. No recent hypoglycemic episodes. -Check A1C today  -No refills needed today

## 2023-08-01 NOTE — Progress Notes (Signed)
Labs are stable. Continue current meds, healthy, low-carb diet, hydration, and regular exercise.

## 2023-08-08 ENCOUNTER — Other Ambulatory Visit (HOSPITAL_BASED_OUTPATIENT_CLINIC_OR_DEPARTMENT_OTHER): Payer: Self-pay

## 2023-08-08 ENCOUNTER — Other Ambulatory Visit: Payer: Self-pay | Admitting: Family Medicine

## 2023-08-08 DIAGNOSIS — E1165 Type 2 diabetes mellitus with hyperglycemia: Secondary | ICD-10-CM

## 2023-08-08 MED ORDER — SITAGLIPTIN PHOSPHATE 100 MG PO TABS
100.0000 mg | ORAL_TABLET | Freq: Every day | ORAL | 0 refills | Status: DC
  Filled 2023-08-08: qty 90, 90d supply, fill #0

## 2023-08-31 ENCOUNTER — Ambulatory Visit (INDEPENDENT_AMBULATORY_CARE_PROVIDER_SITE_OTHER): Payer: Medicare Other

## 2023-08-31 DIAGNOSIS — E538 Deficiency of other specified B group vitamins: Secondary | ICD-10-CM | POA: Diagnosis not present

## 2023-08-31 MED ORDER — CYANOCOBALAMIN 1000 MCG/ML IJ SOLN
1000.0000 ug | Freq: Once | INTRAMUSCULAR | Status: AC
Start: 2023-08-31 — End: 2023-08-31
  Administered 2023-08-31: 1000 ug via INTRAMUSCULAR

## 2023-08-31 NOTE — Progress Notes (Signed)
Diana George is a 77 y.o. female presents to the office today for Monthly B12 injection, per physician's orders. Original order: "07/31/23: Vitamin B12 deficiency: monthly injections, due today, doing well, no concerns." Cyanocobalamin 1000 mg/ml IM was administered R deltoid today. Patient tolerated injection. Patient due for follow up labs/provider appt: No. Date due: 5 months, appt made No- apt already pending  Patient next injection due: 1 month, appt made Yes  Creft, Melton Alar L

## 2023-09-06 ENCOUNTER — Ambulatory Visit
Admission: EM | Admit: 2023-09-06 | Discharge: 2023-09-06 | Disposition: A | Discharge status: Home / Self Care | Payer: Medicare Other | Attending: Internal Medicine | Admitting: Internal Medicine

## 2023-09-06 DIAGNOSIS — Z23 Encounter for immunization: Secondary | ICD-10-CM | POA: Diagnosis not present

## 2023-09-06 DIAGNOSIS — S61213A Laceration without foreign body of left middle finger without damage to nail, initial encounter: Secondary | ICD-10-CM

## 2023-09-06 MED ORDER — TETANUS-DIPHTH-ACELL PERTUSSIS 5-2.5-18.5 LF-MCG/0.5 IM SUSY
0.5000 mL | PREFILLED_SYRINGE | Freq: Once | INTRAMUSCULAR | Status: AC
  Administered 2023-09-06: 0.5 mL via INTRAMUSCULAR

## 2023-09-06 NOTE — ED Triage Notes (Signed)
Pt presents to UC w/ c/o left hand 3rd finger laceration which occurred this morning while cutting flowers. She reports there was a lot of bleeding. She is not on a blood thinner. She is not up to date on tetanus vaccine.

## 2023-09-06 NOTE — Discharge Instructions (Addendum)
Keep area clean and dry.  The glue used on the wound will dissolve on its own over the next 5 days.  Do not soak or scrub the wound.  Monitor for any signs of infection including redness, drainage, warmth, fevers or chills and seek reevaluation if this occurs.  Your tetanus has been updated in clinic today as well.  Follow-up with your PCP as needed.

## 2023-09-06 NOTE — ED Provider Notes (Signed)
[facility]    CSN: [id number] Arrival date & time: 09/06/23  1725      History   Chief Complaint No chief complaint on file.   HPI Diana George is a 77 y.o. female presents for finger laceration.  Patient reports this morning she cut her left third finger with some pruning shears accidentally.  States it did bleed a lot but she has been cleaning it with alcohol and applying pressure.  Denies any numbness or tingling.  She is not on blood thinning medications.  She is not up-to-date on her tetanus.  No other injuries or concerns at this time.  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   B12 deficiency 04/30/2023   Non compliance w medication regimen 02/13/2023   Visual hallucinations 02/13/2023   Hypnagogic hallucinations 02/13/2023   Diarrhea 12/25/2022   OSA (obstructive sleep apnea) 04/13/2014   Allergic rhinitis 09/29/2012   Situational stress 09/29/2012   Hyperlipidemia 06/16/2011   Diabetes mellitus (HCC) 06/16/2011   Anxiety and depression 06/16/2011    Past Surgical History:  Procedure Laterality Date   CATARACT EXTRACTION W/ INTRAOCULAR LENS  IMPLANT, BILATERAL  2010   heart ablation  2010   LEG SURGERY Left 2011   fracture with hardware   STONE EXTRACTION WITH BASKET  2016, 1977   TUBAL LIGATION  1977    OB History   No obstetric history on file.      Home Medications    Prior to Admission medications   Medication Sig Start Date End Date Taking? Authorizing Provider  Cholecalciferol (VITAMIN D) 2000 UNITS CAPS Take 1 capsule by mouth 2 (two) times daily.    [provider]  CYANOCOBALAMIN IJ Inject as directed every 30 (thirty) days.     [provider]  glipiZIDE (GLUCOTROL) 5 MG tablet Take 0.5 tablets (2.5 mg total) by mouth 2 (two) times daily with breakfast and lunch. 07/09/23   Clayborne Dana, NP  hyoscyamine (LEVSIN) 0.125 MG tablet Take 0.125 mg by mouth as needed.    [provider]  propranolol (INDERAL) 20 MG tablet Take 1 tablet (20 mg total) by mouth 2 (two) times daily. 07/13/23   Clayborne Dana, NP  sertraline (ZOLOFT) 100 MG tablet Take 1 tablet (100 mg total) by mouth daily. 03/21/23     sertraline (ZOLOFT) 100 MG tablet Take 1 tablet (100 mg total) by mouth daily. 06/04/23     simvastatin (ZOCOR) 20 MG tablet TAKE 1 TABLET BY MOUTH DAILY 03/05/23   Margaree Mackintosh, MD  sitaGLIPtin (JANUVIA) 100 MG tablet Take 1 tablet (100 mg total) by mouth daily. 08/08/23   Clayborne Dana, NP    Family History Family History  Problem Relation Age of Onset   Alzheimer's disease Mother    Alzheimer's disease Father    Heart disease Father    Colon cancer Father 81   Tremor Father     Social History Social History   Tobacco Use   Smoking status: Never   Smokeless tobacco: Never  Vaping Use   Vaping status: Never Used  Substance Use Topics  Alcohol use: No    Alcohol/week: 0.0 standard drinks of alcohol   Drug use: No     Allergies   Amoxicillin   Review of Systems Review of Systems  Skin:        Finger laceration     Physical Exam Triage Vital Signs ED Triage Vitals  Encounter Vitals Group     BP 09/06/23 1734 132/75     Systolic BP Percentile --      Diastolic BP Percentile --      Pulse Rate 09/06/23 1734 77     Resp 09/06/23 1734 16     Temp 09/06/23 1734 97.6 F (36.4 C)     Temp Source 09/06/23 1734 Oral     SpO2 09/06/23 1734 98 %     Weight --      Height --      Head Circumference --      Peak Flow --      Pain Score 09/06/23 1733 8     Pain Loc --      Pain Education --      Exclude from Growth Chart --    No data found.  Updated Vital Signs BP 132/75 (BP  Location: Left Arm)   Pulse 77   Temp 97.6 F (36.4 C) (Oral)   Resp 16   SpO2 98%   Visual Acuity Right Eye Distance:   Left Eye Distance:   Bilateral Distance:    Right Eye Near:   Left Eye Near:    Bilateral Near:     Physical Exam Vitals and nursing note reviewed.  Constitutional:      General: She is not in acute distress.    Appearance: Normal appearance. She is not ill-appearing.  HENT:     Head: Normocephalic and atraumatic.  Eyes:     Pupils: Pupils are equal, round, and reactive to light.  Cardiovascular:     Rate and Rhythm: Normal rate.  Pulmonary:     Effort: Pulmonary effort is normal.  Musculoskeletal:       Hands:     Comments: There is a less than 1 cm superficial linear well-approximated laceration to the palmar aspect of the distal left third finger.  No nail involvement.  No active bleeding.  No tendon laceration.  Cap refill +2.  Skin:    General: Skin is warm and dry.  Neurological:     General: No focal deficit present.     Mental Status: She is alert and oriented to person, place, and time.  Psychiatric:        Mood and Affect: Mood normal.        Behavior: Behavior normal.      UC Treatments / Results  Labs (all labs ordered are listed, but only abnormal results are displayed) Labs Reviewed - No data to display  EKG   Radiology No results found.  Procedures Laceration Repair  Date/Time: 09/06/2023 5:45 PM  Performed by: Radford Pax, NP Authorized by: Radford Pax, NP   Consent:    Consent obtained:  Verbal   Consent given by:  Patient   Risks discussed:  Infection, need for additional repair, poor wound healing, poor cosmetic result, retained foreign body, tendon damage, vascular damage, pain and nerve damage   Alternatives discussed:  Observation, no treatment and referral Universal protocol:    Patient identity confirmed:  Verbally with patient Anesthesia:    Anesthesia method:  None Laceration details:    Location:  Finger   Finger location:  L long finger   Wound length (cm): less than 1cm. Pre-procedure details:    Preparation:  Patient was prepped and draped in usual sterile fashion Exploration:    Wound exploration: wound explored through full range of motion     Wound extent comment:  Subcutaneous   Contaminated: no   Treatment:    Area cleansed with:  Chlorhexidine   Amount of cleaning:  Standard Skin repair:    Repair method:  Tissue adhesive Approximation:    Approximation:  Close Repair type:    Repair type:  Simple Post-procedure details:    Dressing:  Non-adherent dressing   Procedure completion:  Tolerated well, no immediate complications  (including critical care time)  Medications Ordered in UC Medications  Tdap (BOOSTRIX) injection 0.5 mL (has no administration in time range)    Initial Impression / Assessment and Plan / UC Course  I have reviewed the triage vital signs and the nursing notes.  Pertinent labs & imaging results that were available during my care of the patient were reviewed by me and considered in my medical decision making (see chart for details).     Patient presenting with superficial laceration to left third finger.  Wound closed with Dermabond and dressing applied.  Tetanus was updated in clinic.  Wound care/signs and symptoms of infection reviewed.  Follow-up with PCP as needed.  ER precautions reviewed. Final Clinical Impressions(s) / UC Diagnoses   Final diagnoses:  Laceration of left middle finger without foreign body without damage to nail, initial encounter     Discharge Instructions      Keep area clean and dry.  The glue used on the wound will dissolve on its own over the next 5 days.  Do not soak or scrub the wound.  Monitor for any signs of infection including redness, drainage, warmth, fevers or chills and seek reevaluation if this occurs.  Your tetanus has been updated in clinic today as well.  Follow-up with your PCP as  needed.    ED Prescriptions   None    PDMP not reviewed this encounter.   Radford Pax, NP 09/06/23 [id number]

## 2023-09-07 ENCOUNTER — Other Ambulatory Visit (HOSPITAL_BASED_OUTPATIENT_CLINIC_OR_DEPARTMENT_OTHER): Payer: Self-pay

## 2023-09-07 ENCOUNTER — Encounter: Payer: Self-pay | Admitting: Family Medicine

## 2023-09-07 ENCOUNTER — Ambulatory Visit (INDEPENDENT_AMBULATORY_CARE_PROVIDER_SITE_OTHER): Payer: Medicare Other | Admitting: Family Medicine

## 2023-09-07 VITALS — BP 125/64 | HR 74 | Ht 66.0 in | Wt 206.0 lb

## 2023-09-07 DIAGNOSIS — L0291 Cutaneous abscess, unspecified: Secondary | ICD-10-CM | POA: Diagnosis not present

## 2023-09-07 DIAGNOSIS — K649 Unspecified hemorrhoids: Secondary | ICD-10-CM

## 2023-09-07 MED ORDER — CEPHALEXIN 500 MG PO CAPS
500.0000 mg | ORAL_CAPSULE | Freq: Three times a day (TID) | ORAL | 0 refills | Status: AC
  Filled 2023-09-07: qty 15, 5d supply, fill #0

## 2023-09-07 MED ORDER — HYDROCORTISONE (PERIANAL) 2.5 % EX CREA
1.0000 | TOPICAL_CREAM | Freq: Two times a day (BID) | CUTANEOUS | 0 refills | Status: AC
  Filled 2023-09-07: qty 30, 10d supply, fill #0

## 2023-09-07 NOTE — Progress Notes (Signed)
Acute Office Visit  Subjective:     Patient ID: Diana George, female    DOB: 08-04-1946, 77 y.o.   MRN: [id number]  Chief Complaint  Patient presents with   Hemorrhoids    HPI Patient is in today for possible hemorrhoid.   Discussed the use of AI scribe software for clinical note transcription with the patient, who gave verbal consent to proceed.  History of Present Illness   The patient presented with a new onset of hemorrhoidal symptoms. They first noticed a small, pencil eraser-sized lump during a shower last weekend. Over the course of the week, the lump seemed to decrease in size, but then suddenly enlarged yesterday. The patient described the current size as significantly larger and reported associated discomfort, rating the pain as a 5 out of 10. They also noted bleeding when wiping after bowel movements. The patient denied any recent constipation or prolonged sitting on the toilet. They also reported no changes in bowel movements and no abdominal pain.  In addition to the hemorrhoidal symptoms, on exam the patient mentioned a sore spot slightly proximal from anus, which was also causing discomfort. They were unsure if this sore spot was the initial symptom they noticed at the beginning of the week. The patient has been wearing disposable undergarments due to urinary incontinence and wondered if this could be contributing to the irritation.  The patient denied any recent changes in their health or lifestyle, except for being on their feet a lot the previous day due to having company. They reported no vaginal or bladder issues.            ROS All review of systems negative except what is listed in the HPI      Objective:    BP 125/64   Pulse 74   Ht 5\' 6"  (1.676 m)   Wt 206 lb (93.4 kg)   SpO2 97%   BMI 33.25 kg/m    Physical Exam Vitals reviewed. Exam conducted with a chaperone present.  Genitourinary:    Rectum: External hemorrhoid present. No anal  fissure.          No results found for any visits on 09/07/23.      Assessment & Plan:   Problem List Items Addressed This Visit   None Visit Diagnoses     Abscess    -  Primary   Relevant Medications   cephALEXin (KEFLEX) 500 MG capsule   Hemorrhoids, unspecified hemorrhoid type       Relevant Medications   hydrocortisone (ANUSOL-HC) 2.5 % rectal cream        External Hemorrhoids Acute onset of external hemorrhoids with associated discomfort and bleeding. No constipation or prolonged sitting reported. -Apply prescribed hydrocortisone cream directly to the hemorrhoid. -Use sitz bath 2-3 times a day. -Avoid straining and constipation.  Perianal Abscess/Ingrown Hair Concurrently with the hemorrhoids, a small, painful perianal lesion was identified, possibly an abscess or ingrown hair. -Start Keflex -Apply warm compresses if possible. Sitz baths. -Consider changing brand of disposable underwear to a softer type to avoid irritation. If no improvement within a week, return for re-evaluation.        Meds ordered this encounter  Medications   cephALEXin (KEFLEX) 500 MG capsule    Sig: Take 1 capsule (500 mg total) by mouth 3 (three) times daily for 5 days.    Dispense:  15 capsule    Refill:  0    Order Specific Question:   Supervising Provider  Answer:   Danise Edge A [[id number]]   hydrocortisone (ANUSOL-HC) 2.5 % rectal cream    Sig: Place 1 Application rectally 2 (two) times daily.    Dispense:  30 g    Refill:  0    Order Specific Question:   Supervising Provider    Answer:   Danise Edge A [[id number]]    Return if symptoms worsen or fail to improve.  Clayborne Dana, NP

## 2023-09-07 NOTE — Patient Instructions (Signed)
External Hemorrhoids Acute onset of external hemorrhoids with associated discomfort and bleeding. No constipation or prolonged sitting reported. -Apply prescribed hydrocortisone cream directly to the hemorrhoid. -Use sitz bath 2-3 times a day. -Avoid straining and constipation.  Perianal Abscess/Ingrown Hair Concurrently with the hemorrhoids, a small, painful perianal lesion was identified, possibly an abscess or ingrown hair. -Start Keflex -Apply warm compresses if possible. Sitz baths. -Consider changing brand of disposable underwear to a softer type to avoid irritation. If no improvement within a week, return for re-evaluation.

## 2023-09-12 ENCOUNTER — Emergency Department (HOSPITAL_BASED_OUTPATIENT_CLINIC_OR_DEPARTMENT_OTHER): Payer: Medicare Other

## 2023-09-12 ENCOUNTER — Other Ambulatory Visit: Payer: Self-pay

## 2023-09-12 ENCOUNTER — Emergency Department (HOSPITAL_BASED_OUTPATIENT_CLINIC_OR_DEPARTMENT_OTHER)
Admission: EM | Admit: 2023-09-12 | Discharge: 2023-09-12 | Disposition: A | Discharge status: Home / Self Care | Payer: Medicare Other | Attending: Emergency Medicine | Admitting: Emergency Medicine

## 2023-09-12 ENCOUNTER — Encounter (HOSPITAL_BASED_OUTPATIENT_CLINIC_OR_DEPARTMENT_OTHER): Payer: Self-pay | Admitting: Emergency Medicine

## 2023-09-12 DIAGNOSIS — M25532 Pain in left wrist: Secondary | ICD-10-CM | POA: Insufficient documentation

## 2023-09-12 DIAGNOSIS — S0083XA Contusion of other part of head, initial encounter: Secondary | ICD-10-CM | POA: Insufficient documentation

## 2023-09-12 DIAGNOSIS — S6982XA Other specified injuries of left wrist, hand and finger(s), initial encounter: Secondary | ICD-10-CM | POA: Diagnosis not present

## 2023-09-12 DIAGNOSIS — W19XXXA Unspecified fall, initial encounter: Secondary | ICD-10-CM | POA: Diagnosis not present

## 2023-09-12 DIAGNOSIS — M19032 Primary osteoarthritis, left wrist: Secondary | ICD-10-CM | POA: Diagnosis not present

## 2023-09-12 DIAGNOSIS — S60212A Contusion of left wrist, initial encounter: Secondary | ICD-10-CM | POA: Diagnosis not present

## 2023-09-12 DIAGNOSIS — S0990XA Unspecified injury of head, initial encounter: Secondary | ICD-10-CM | POA: Diagnosis not present

## 2023-09-12 DIAGNOSIS — Z7984 Long term (current) use of oral hypoglycemic drugs: Secondary | ICD-10-CM | POA: Diagnosis not present

## 2023-09-12 DIAGNOSIS — S0093XA Contusion of unspecified part of head, initial encounter: Secondary | ICD-10-CM

## 2023-09-12 DIAGNOSIS — I6523 Occlusion and stenosis of bilateral carotid arteries: Secondary | ICD-10-CM | POA: Diagnosis not present

## 2023-09-12 MED ORDER — ACETAMINOPHEN 325 MG PO TABS
650.0000 mg | ORAL_TABLET | Freq: Once | ORAL | Status: AC
  Administered 2023-09-12: 650 mg via ORAL
  Filled 2023-09-12: qty 2

## 2023-09-12 MED ORDER — IBUPROFEN 400 MG PO TABS
600.0000 mg | ORAL_TABLET | Freq: Once | ORAL | Status: AC
  Administered 2023-09-12: 600 mg via ORAL
  Filled 2023-09-12: qty 1

## 2023-09-12 NOTE — ED Triage Notes (Signed)
Fell yesterday in her kitchen , tripped on dishwasher door . Hit her left forehead , left wrist pain , obvious swelling and bruising . No blood thinners. Denies loc .  No dizziness, no nausea . No neck pain  Alert and oriented x 4 , ambulatory with steady gait to triage room .

## 2023-09-12 NOTE — Discharge Instructions (Signed)
Your x-ray did not show an obvious fracture or broken bone in your wrist today.  It is still possible you have a hairline or small fracture.  Therefore we have given you a wrist brace to help with stability and pain for the next week.  Most brains will improve over the course of the week.  You can use Tylenol and ibuprofen as needed for pain at home.  If you are still having significant pain or limited movement in your left wrist after 1 week, you can call to make an appointment with an orthopedic hand specialist at the number above.

## 2023-09-12 NOTE — ED Provider Notes (Signed)
[place] [facility] Provider Note   CSN: [id number] Arrival date & time: 09/12/23  3664     History  Chief Complaint  Patient presents with   Diana George    Diana George is a 77 y.o. female presented to the ED with a mechanical fall and isolated injury to the forehead, as well as pain in her left wrist after catching himself on outstretched hand.  This fall occurred yesterday.  Patient is not on anticoagulation.  She is predominantly right-handed.  She is complaining mostly of pain in her left wrist  HPI     Home Medications Prior to Admission medications   Medication Sig Start Date End Date Taking? Authorizing Provider  cephALEXin (KEFLEX) 500 MG capsule Take 1 capsule (500 mg total) by mouth 3 (three) times daily for 5 days. 09/07/23 09/12/23  Clayborne Dana, NP  Cholecalciferol (VITAMIN D) 2000 UNITS CAPS Take 1 capsule by mouth 2 (two) times daily.    [provider]  CYANOCOBALAMIN IJ Inject as directed every 30 (thirty) days.    [provider]  glipiZIDE (GLUCOTROL) 5 MG tablet Take 0.5 tablets (2.5 mg total) by mouth 2 (two) times daily with breakfast and lunch. 07/09/23   Clayborne Dana, NP  hydrocortisone (ANUSOL-HC) 2.5 % rectal cream Place 1 Application rectally 2 (two) times daily. 09/07/23   Clayborne Dana, NP  hyoscyamine (LEVSIN) 0.125 MG tablet Take 0.125 mg by mouth as needed.    [provider]  propranolol (INDERAL) 20 MG tablet Take 1 tablet (20 mg total) by mouth 2 (two) times daily. 07/13/23   Clayborne Dana, NP  sertraline (ZOLOFT) 100 MG tablet Take 1 tablet (100 mg total) by mouth daily. 06/04/23     simvastatin (ZOCOR) 20 MG tablet TAKE 1 TABLET BY MOUTH DAILY 03/05/23   Margaree Mackintosh, MD  sitaGLIPtin (JANUVIA) 100 MG tablet Take 1 tablet (100 mg total) by mouth daily. 08/08/23   Clayborne Dana, NP      Allergies    Amoxicillin    Review of Systems   Review of Systems  Physical Exam Updated Vital  Signs BP (!) 116/103   Pulse 71   Temp 98.4 F (36.9 C) (Oral)   Resp 18   SpO2 97%  Physical Exam Constitutional:      General: She is not in acute distress. HENT:     Head: Normocephalic.     Comments: Contusion to left forehead Eyes:     Conjunctiva/sclera: Conjunctivae normal.     Pupils: Pupils are equal, round, and reactive to light.  Cardiovascular:     Rate and Rhythm: Normal rate and regular rhythm.  Pulmonary:     Effort: Pulmonary effort is normal. No respiratory distress.  Abdominal:     General: There is no distension.     Tenderness: There is no abdominal tenderness.  Musculoskeletal:     Comments: Tenderness of the left wrist with mild swelling, no tenderness of the elbow or more proximal left arm.  Patient is able to flex and extend all fingers.  No visible deformity on exam.  Patient does have chronic injury to the left third finger, but appropriately bandaged.  Skin:    General: Skin is warm and dry.  Neurological:     General: No focal deficit present.     Mental Status: She is alert. Mental status is at baseline.  Psychiatric:        Mood  and Affect: Mood normal.        Behavior: Behavior normal.     ED Results / Procedures / Treatments   Labs (all labs ordered are listed, but only abnormal results are displayed) Labs Reviewed - No data to display  EKG None  Radiology DG Wrist Complete Left  Result Date: 09/12/2023 CLINICAL DATA:  Injury.  Left wrist pain. EXAM: LEFT WRIST - COMPLETE 3+ VIEW COMPARISON:  09/27/2006 and 10/08/2020. FINDINGS: No acute fracture or dislocation. No aggressive osseous lesion. There are mild-to-moderate degenerative changes of the imaged joints. No radiopaque foreign bodies. Soft tissues are within normal limits. IMPRESSION: *No acute osseous abnormality of the left wrist. Electronically Signed   By: Jules Schick M.D.   On: 09/12/2023 10:58   CT Head Wo Contrast  Result Date: 09/12/2023 CLINICAL DATA:  Head trauma,  moderate to severe. Cord contusion from fall and head injury. EXAM: CT HEAD WITHOUT CONTRAST TECHNIQUE: Contiguous axial images were obtained from the base of the skull through the vertex without intravenous contrast. RADIATION DOSE REDUCTION: This exam was performed according to the departmental dose-optimization program which includes automated exposure control, adjustment of the mA and/or kV according to patient size and/or use of iterative reconstruction technique. COMPARISON:  CT head without contrast 02/10/2023. FINDINGS: Brain: No acute infarct, hemorrhage, or mass lesion is present. Mild atrophy and moderate white matter changes are again seen. No acute infarct, hemorrhage, or mass lesion is present. The ventricles are of normal size. No significant extraaxial fluid collection is present. The brainstem and cerebellum are within normal limits. Midline structures are within normal limits. Vascular: Vascular calcifications are present within the cavernous internal carotid arteries bilaterally and at the dural margin of both vertebral arteries. Skull: Calvarium is intact. No focal lytic or blastic lesions are present. No significant extracranial soft tissue lesion is present. Sinuses/Orbits: The paranasal sinuses and mastoid air cells are clear. The globes and orbits are within normal limits. IMPRESSION: 1. No acute intracranial abnormality or significant interval change. 2. 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 Electronically Signed   By: Marin Roberts M.D.   On: 09/12/2023 10:04    Procedures Procedures    Medications Ordered in ED Medications  ibuprofen (ADVIL) tablet 600 mg (has no administration in time range)  acetaminophen (TYLENOL) tablet 650 mg (has no administration in time range)    ED Course/ Medical Decision Making/ A&P                                 Medical Decision Making Amount and/or Complexity of  Data Reviewed Radiology: ordered.  Risk OTC drugs.   Patient is here with a mechanical fall with an isolated injury to her forehead and left wrist.  No concern for spinal fracture complaint of neck pain or tenderness.  She is neurovascularly intact.  CT imaging of the head and x-ray of the wrist were ordered and personally viewed interpreted, notable for no emergent findings.  I do retain a reasonably high clinical suspicion for a potential hairline fracture of the wrist or distal forearm given her degree of pain or discomfort with palpation.  We will place her in a wrist brace for now.  If she continues having significant pain or limited use of mobility of her left wrist in 1 week, she can follow-up with an orthopedic hand surgeon.  We will give her  some Motrin and Tylenol for pain here.  The patient's son was also present for discharge        Final Clinical Impression(s) / ED Diagnoses Final diagnoses:  Fall, initial encounter  Contusion of head, unspecified part of head, initial encounter  Left wrist pain    Rx / DC Orders ED Discharge Orders     None         Terald Sleeper, MD 09/12/23 1110

## 2023-09-12 NOTE — ED Notes (Signed)
Pt alert and oriented X 4 at the time of discharge. RR even and unlabored. No acute distress noted. Pt verbalized understanding of discharge instructions as discussed. Pt ambulatory to lobby at time of discharge.

## 2023-09-18 DIAGNOSIS — G4733 Obstructive sleep apnea (adult) (pediatric): Secondary | ICD-10-CM | POA: Diagnosis not present

## 2023-09-20 DIAGNOSIS — K080 Exfoliation of teeth due to systemic causes: Secondary | ICD-10-CM | POA: Diagnosis not present

## 2023-10-01 ENCOUNTER — Other Ambulatory Visit: Payer: Self-pay | Admitting: Internal Medicine

## 2023-10-01 ENCOUNTER — Other Ambulatory Visit: Payer: Self-pay | Admitting: Family Medicine

## 2023-10-01 ENCOUNTER — Other Ambulatory Visit (HOSPITAL_BASED_OUTPATIENT_CLINIC_OR_DEPARTMENT_OTHER): Payer: Self-pay

## 2023-10-01 ENCOUNTER — Other Ambulatory Visit: Payer: Self-pay

## 2023-10-01 DIAGNOSIS — G250 Essential tremor: Secondary | ICD-10-CM

## 2023-10-01 MED ORDER — SIMVASTATIN 20 MG PO TABS
20.0000 mg | ORAL_TABLET | Freq: Every day | ORAL | 1 refills | Status: DC
  Filled 2023-10-01: qty 90, 90d supply, fill #0
  Filled 2023-12-31: qty 90, 90d supply, fill #1

## 2023-10-01 MED ORDER — PROPRANOLOL HCL 20 MG PO TABS
20.0000 mg | ORAL_TABLET | Freq: Two times a day (BID) | ORAL | 1 refills | Status: DC
  Filled 2023-10-01: qty 180, 90d supply, fill #0
  Filled 2023-12-31: qty 180, 90d supply, fill #1

## 2023-10-02 ENCOUNTER — Other Ambulatory Visit (HOSPITAL_BASED_OUTPATIENT_CLINIC_OR_DEPARTMENT_OTHER): Payer: Self-pay

## 2023-10-02 ENCOUNTER — Ambulatory Visit (INDEPENDENT_AMBULATORY_CARE_PROVIDER_SITE_OTHER): Payer: Medicare Other

## 2023-10-02 DIAGNOSIS — E538 Deficiency of other specified B group vitamins: Secondary | ICD-10-CM

## 2023-10-02 MED ORDER — CYANOCOBALAMIN 1000 MCG/ML IJ SOLN
1000.0000 ug | Freq: Once | INTRAMUSCULAR | Status: AC
  Administered 2023-10-02: 1000 ug via INTRAMUSCULAR

## 2023-10-02 NOTE — Progress Notes (Signed)
Diana George is a 77 y.o. female presents to the office today for Monthly B12 injection, per physician's orders. Original order: "07/31/23: Vitamin B12 deficiency: monthly injections, due today, doing well, no concerns." Cyanocobalamin 1000 mg/ml IM was administered L (given in R last month) deltoid today. Patient tolerated injection. Patient due for follow up labs/provider appt: No. Date due: 5 months, appt made No- apt already pending  Patient next injection due: 1 month, appt made Yes  Creft, Melton Alar L

## 2023-10-09 DIAGNOSIS — H524 Presbyopia: Secondary | ICD-10-CM | POA: Diagnosis not present

## 2023-10-09 LAB — HM DIABETES EYE EXAM

## 2023-11-02 ENCOUNTER — Ambulatory Visit (INDEPENDENT_AMBULATORY_CARE_PROVIDER_SITE_OTHER): Payer: Medicare Other

## 2023-11-02 DIAGNOSIS — E538 Deficiency of other specified B group vitamins: Secondary | ICD-10-CM

## 2023-11-02 MED ORDER — CYANOCOBALAMIN 1000 MCG/ML IJ SOLN
1000.0000 ug | Freq: Once | INTRAMUSCULAR | Status: AC
  Administered 2023-11-02: 1000 ug via INTRAMUSCULAR

## 2023-11-02 NOTE — Progress Notes (Signed)
 Pt here for monthly B12 injection per Ladona Ridgel  B12 given IM, and pt tolerated injection well.  Next B12 injection scheduled for 02/04

## 2023-11-05 ENCOUNTER — Other Ambulatory Visit: Payer: Self-pay | Admitting: Family Medicine

## 2023-11-05 ENCOUNTER — Other Ambulatory Visit (HOSPITAL_BASED_OUTPATIENT_CLINIC_OR_DEPARTMENT_OTHER): Payer: Self-pay

## 2023-11-05 DIAGNOSIS — E1165 Type 2 diabetes mellitus with hyperglycemia: Secondary | ICD-10-CM

## 2023-11-05 MED ORDER — GLIPIZIDE 5 MG PO TABS
2.5000 mg | ORAL_TABLET | Freq: Two times a day (BID) | ORAL | 1 refills | Status: DC
  Filled 2023-11-05: qty 90, 90d supply, fill #0

## 2023-11-05 MED ORDER — SITAGLIPTIN PHOSPHATE 100 MG PO TABS
100.0000 mg | ORAL_TABLET | Freq: Every day | ORAL | 1 refills | Status: DC
  Filled 2023-11-05: qty 90, 90d supply, fill #0
  Filled 2024-02-21: qty 90, 90d supply, fill #1

## 2023-12-04 ENCOUNTER — Other Ambulatory Visit (HOSPITAL_BASED_OUTPATIENT_CLINIC_OR_DEPARTMENT_OTHER): Payer: Self-pay

## 2023-12-04 ENCOUNTER — Ambulatory Visit (INDEPENDENT_AMBULATORY_CARE_PROVIDER_SITE_OTHER): Payer: Medicare Other | Admitting: *Deleted

## 2023-12-04 DIAGNOSIS — E538 Deficiency of other specified B group vitamins: Secondary | ICD-10-CM | POA: Diagnosis not present

## 2023-12-04 DIAGNOSIS — F3342 Major depressive disorder, recurrent, in full remission: Secondary | ICD-10-CM | POA: Diagnosis not present

## 2023-12-04 MED ORDER — CYANOCOBALAMIN 1000 MCG/ML IJ SOLN
1000.0000 ug | Freq: Once | INTRAMUSCULAR | Status: AC
  Administered 2023-12-04: 1000 ug via INTRAMUSCULAR

## 2023-12-04 MED ORDER — SERTRALINE HCL 100 MG PO TABS
100.0000 mg | ORAL_TABLET | Freq: Every day | ORAL | 3 refills | Status: DC
  Filled 2023-12-04: qty 90, 90d supply, fill #0

## 2023-12-04 NOTE — Progress Notes (Addendum)
 Pt here for monthly B12 injection per PCP.   B12 given IM in RD, and pt tolerated injection well.   Next B12 injection scheduled for 03/04

## 2024-01-01 ENCOUNTER — Ambulatory Visit (INDEPENDENT_AMBULATORY_CARE_PROVIDER_SITE_OTHER): Payer: Medicare Other

## 2024-01-01 DIAGNOSIS — E538 Deficiency of other specified B group vitamins: Secondary | ICD-10-CM | POA: Diagnosis not present

## 2024-01-01 MED ORDER — CYANOCOBALAMIN 1000 MCG/ML IJ SOLN
1000.0000 ug | Freq: Once | INTRAMUSCULAR | Status: AC
  Administered 2024-01-01: 1000 ug via INTRAMUSCULAR

## 2024-01-01 NOTE — Progress Notes (Signed)
 Diana George is a 78 y.o. female presents to the office today for Monthly B12 injection, per physician's orders. Original order: "07/31/23: Vitamin B12 deficiency: monthly injections, due today, doing well, no concerns." Cyanocobalamin 1000 mg/ml IM was administered R Deltoid today. Patient tolerated injection. Patient due for follow up labs/provider appt: Yes. Date due: 01/29/24, appt made No- Pending Patient next injection due: 1 month, appt made NO- will get at 01/29/24 OV   Creft, Feliberto Harts

## 2024-01-08 DIAGNOSIS — Z124 Encounter for screening for malignant neoplasm of cervix: Secondary | ICD-10-CM | POA: Diagnosis not present

## 2024-01-08 DIAGNOSIS — Z6834 Body mass index (BMI) 34.0-34.9, adult: Secondary | ICD-10-CM | POA: Diagnosis not present

## 2024-01-08 DIAGNOSIS — Z1151 Encounter for screening for human papillomavirus (HPV): Secondary | ICD-10-CM | POA: Diagnosis not present

## 2024-01-08 DIAGNOSIS — Z01419 Encounter for gynecological examination (general) (routine) without abnormal findings: Secondary | ICD-10-CM | POA: Diagnosis not present

## 2024-01-08 DIAGNOSIS — Z1231 Encounter for screening mammogram for malignant neoplasm of breast: Secondary | ICD-10-CM | POA: Diagnosis not present

## 2024-01-14 ENCOUNTER — Other Ambulatory Visit: Payer: Self-pay | Admitting: Obstetrics and Gynecology

## 2024-01-14 DIAGNOSIS — R928 Other abnormal and inconclusive findings on diagnostic imaging of breast: Secondary | ICD-10-CM

## 2024-01-28 ENCOUNTER — Ambulatory Visit
Admission: RE | Admit: 2024-01-28 | Discharge: 2024-01-28 | Disposition: A | Discharge status: Home / Self Care | Source: Ambulatory Visit | Attending: Obstetrics and Gynecology | Admitting: Obstetrics and Gynecology

## 2024-01-28 DIAGNOSIS — N6315 Unspecified lump in the right breast, overlapping quadrants: Secondary | ICD-10-CM | POA: Diagnosis not present

## 2024-01-28 DIAGNOSIS — R928 Other abnormal and inconclusive findings on diagnostic imaging of breast: Secondary | ICD-10-CM

## 2024-01-29 ENCOUNTER — Encounter: Payer: Self-pay | Admitting: Family Medicine

## 2024-01-29 ENCOUNTER — Ambulatory Visit (INDEPENDENT_AMBULATORY_CARE_PROVIDER_SITE_OTHER): Payer: Medicare Other | Admitting: Family Medicine

## 2024-01-29 ENCOUNTER — Telehealth: Payer: Self-pay

## 2024-01-29 VITALS — BP 113/69 | HR 68 | Ht 66.0 in | Wt 208.0 lb

## 2024-01-29 DIAGNOSIS — E875 Hyperkalemia: Secondary | ICD-10-CM

## 2024-01-29 DIAGNOSIS — R32 Unspecified urinary incontinence: Secondary | ICD-10-CM | POA: Diagnosis not present

## 2024-01-29 DIAGNOSIS — E538 Deficiency of other specified B group vitamins: Secondary | ICD-10-CM | POA: Diagnosis not present

## 2024-01-29 DIAGNOSIS — G4733 Obstructive sleep apnea (adult) (pediatric): Secondary | ICD-10-CM

## 2024-01-29 DIAGNOSIS — F419 Anxiety disorder, unspecified: Secondary | ICD-10-CM | POA: Diagnosis not present

## 2024-01-29 DIAGNOSIS — E1165 Type 2 diabetes mellitus with hyperglycemia: Secondary | ICD-10-CM

## 2024-01-29 DIAGNOSIS — K5900 Constipation, unspecified: Secondary | ICD-10-CM

## 2024-01-29 DIAGNOSIS — F32A Depression, unspecified: Secondary | ICD-10-CM

## 2024-01-29 DIAGNOSIS — Z7984 Long term (current) use of oral hypoglycemic drugs: Secondary | ICD-10-CM

## 2024-01-29 LAB — COMPREHENSIVE METABOLIC PANEL WITH GFR
ALT: 17 U/L (ref 0–35)
AST: 18 U/L (ref 0–37)
Albumin: 4.3 g/dL (ref 3.5–5.2)
Alkaline Phosphatase: 83 U/L (ref 39–117)
BUN: 23 mg/dL (ref 6–23)
CO2: 28 meq/L (ref 19–32)
Calcium: 9.4 mg/dL (ref 8.4–10.5)
Chloride: 106 meq/L (ref 96–112)
Creatinine, Ser: 1.22 mg/dL — ABNORMAL HIGH (ref 0.40–1.20)
GFR: 42.85 mL/min — ABNORMAL LOW (ref >60.00)
Glucose, Bld: 147 mg/dL — ABNORMAL HIGH (ref 70–99)
Potassium: 5.3 meq/L — ABNORMAL HIGH (ref 3.5–5.1)
Sodium: 140 meq/L (ref 135–145)
Total Bilirubin: 0.6 mg/dL (ref 0.2–1.2)
Total Protein: 6.6 g/dL (ref 6.0–8.3)

## 2024-01-29 LAB — HEMOGLOBIN A1C: Hgb A1c MFr Bld: 7.4 % — ABNORMAL HIGH (ref 4.6–6.5)

## 2024-01-29 MED ORDER — CYANOCOBALAMIN 1000 MCG/ML IJ SOLN
1000.0000 ug | Freq: Once | INTRAMUSCULAR | Status: AC
  Administered 2024-01-29: 1000 ug via INTRAMUSCULAR

## 2024-01-29 NOTE — Progress Notes (Signed)
 Established Patient Office Visit  Subjective   Patient ID: Diana George, female    DOB: [birth year]  Age: 78 y.o. MRN: [id number]  Chief Complaint  Patient presents with   Medical Management of Chronic Issues    HPI  Patient is here for routine follow-up for chronic disease management.   Discussed the use of AI scribe software for clinical note transcription with the patient, who gave verbal consent to proceed.  History of Present Illness Diana George is a 78 year old female who presents with urinary incontinence and constipation.  She has been experiencing urinary incontinence for approximately six months, characterized by an urgent need to urinate when hearing running water, which sometimes results in an inability to reach the bathroom in time. There are no symptoms of urinary tract infection such as burning, odor, or blood in the urine. She describes increased frequency and occasional accidents but does not feel that the volume of urine has increased.  She also experiences constipation, characterized by passing small, hard stools resembling 'balls' most of the time. This issue has been ongoing since last spring when she was informed of her constipation. There is no pain during bowel movements and no history of hemorrhoids. She previously used an over-the-counter stool softener but is not currently taking any fiber supplements. She mentions a history of alternating between diarrhea and constipation, suggesting a possible irritable bowel syndrome pattern.      Diabetes: - Checking glucose at home: occasionally  - Medications: glipizide 2.5 mg BID, Januvia 100 mg daily  - Compliance: good - Diet: general - Exercise: none - Eye exam: scheduled for next month  - Foot exam: today - Microalbumin: UTD - Denies symptoms of hypoglycemia, polyuria, polydipsia, numbness extremities, foot ulcers/trauma, wounds that are not healing, medication side effects  Lab Results  Component  Value Date   HGBA1C 6.9 (H) 07/31/2023     Hyperlipidemia: - medications: simvastatin 20 mg daily - compliance: good - medication SEs: no The 10-year ASCVD risk score (Arnett DK, et al., 2019) is: 27.9%   Values used to calculate the score:     Age: 71 years     Sex: Female     Is Non-Hispanic African American: No     Diabetic: Yes     Tobacco smoker: No     Systolic Blood Pressure: 113 mmHg     Is BP treated: No     HDL Cholesterol: 47.4 mg/dL     Total Cholesterol: 152 mg/dL   Vitamin W09 deficiency: - monthly injections, due today  - doing well, no concerns  Mood follow-up: - Diagnosis: anxiety and depression  - Treatment: Zoloft 100 mg daily  - Medication side effects: none - SI/HI: no - Update: doing well, no new concerns    Tremors: - was following with neurology, but well controlled now - propranolol 20 mg BID   OSA: - following with pulmonology - CPAP at night       ROS All review of systems negative except what is listed in the HPI    Objective:     BP 113/69   Pulse 68   Ht 5\' 6"  (1.676 m)   Wt 208 lb (94.3 kg)   SpO2 98%   BMI 33.57 kg/m    Physical Exam Vitals reviewed.  Constitutional:      Appearance: Normal appearance.  Cardiovascular:     Rate and Rhythm: Normal rate and regular rhythm.     Pulses: Normal pulses.  Heart sounds: Normal heart sounds.  Pulmonary:     Effort: Pulmonary effort is normal.     Breath sounds: Normal breath sounds.  Skin:    General: Skin is warm and dry.  Neurological:     Mental Status: She is alert and oriented to person, place, and time.  Psychiatric:        Mood and Affect: Mood normal.        Behavior: Behavior normal.        Thought Content: Thought content normal.        Judgment: Judgment normal.      No results found for any visits on 01/29/24.    The 10-year ASCVD risk score (Arnett DK, et al., 2019) is: 27.9%    Assessment & Plan:   Problem List Items Addressed This  Visit       Active Problems   Diabetes mellitus (HCC) - Primary   Type 2 Diabetes Mellitus: Controlled with Glipizide 2.5mg  twice daily and Januvia 100mg  daily. No recent hypoglycemic episodes. -Check A1C and CMP today        Relevant Orders   Hemoglobin A1c   Comprehensive metabolic panel with GFR   Anxiety and depression   Mood stable on Zoloft 100 mg - states she has been doing well on this dose for awhile now.      OSA (obstructive sleep apnea)   Sleep Apnea: Using CPAP machine  -Continue follow-up with Pulmonology       B12 deficiency   -Continue monthly B12 injections here      Other Visit Diagnoses       Urinary incontinence, unspecified type     She reports urinary incontinence characterized by an urgent need to urinate triggered by the sound of running water, with episodes occurring for about six months. There are no symptoms of a urinary tract infection such as burning, odor, or hematuria. She prefers to avoid medication. - Order urinalysis - Refer to pelvic floor physical therapy    Relevant Orders   Urinalysis w microscopic + reflex cultur   Ambulatory referral to Physical Therapy     Constipation, unspecified constipation type  She experiences constipation with hard, pebble-like stools, without associated pain or hemorrhoids. The constipation has persisted since last spring. She previously used a stool softener but is not currently taking any fiber supplements. - Provide constipation protocol including daily fiber supplement (Metamucil or Benefiber), hydration, and activity - Recommend daily stool softener (Colace) when feeling constipated - Advise use of Miralax, starting with one capful daily, increasing to two if needed, until soft bowel movements are achieved, then use only as needed - Consider suppositories if Miralax is ineffective after a few days       Return in about 6 months (around 07/30/2024) for physical.    Clayborne Dana, NP

## 2024-01-29 NOTE — Assessment & Plan Note (Signed)
-  Continue monthly B12 injections here

## 2024-01-29 NOTE — Assessment & Plan Note (Signed)
Mood stable on Zoloft 100 mg - states she has been doing well on this dose for awhile now.

## 2024-01-29 NOTE — Assessment & Plan Note (Signed)
 Sleep Apnea: Using CPAP machine  -Continue follow-up with Pulmonology

## 2024-01-29 NOTE — Assessment & Plan Note (Signed)
 Type 2 Diabetes Mellitus: Controlled with Glipizide 2.5mg  twice daily and Januvia 100mg  daily. No recent hypoglycemic episodes. -Check A1C and CMP today

## 2024-01-29 NOTE — Telephone Encounter (Signed)
 Copied from CRM [id number]. Topic: General - Other >> Jan 29, 2024  3:05 PM Adrionna Y wrote: Reason for CRM: [facility] calling about a referral received and they would just like a copy of the patient demographics sent over.

## 2024-01-29 NOTE — Telephone Encounter (Signed)
 See referral request.

## 2024-01-29 NOTE — Patient Instructions (Signed)
CONSTIPATION:  ALL THE TIME:  Lifestyle measures  Hydration: drink plenty of water  Physical activity: at least walking daily  High-fiber foods Bulk forming laxatives   Psyllium (Konsyl; Metamucil; Perdiem)  Methylcellulose (Citrucel)  Calcium polycarbophil (FiberCon; Fiber-Lax; Mitrolan)  Wheat dextrin (Benefiber)  AS NEEDED FOR 3-5 DAYS AT A TIME OR ALL THE TIME 1-2 TIMES PER WEEK  (CAN TAKE ONE FROM EACH CATEGORY E.G. MIRALAX + SENNA) Hyperosmolar or saline laxatives:  Polyethylene glycol (MiraLAX, GlycoLax)  Lactulose  Sorbitol  Magnesium hydroxide (Milk of Magnesia)   Magnesium citrate (Evac-Q-Mag)  Stimulant laxatives  Senna (eg, Black Draught, Ex-Lax, Fletcher's, Castoria, Senokot)   Bisacodyl (eg, Correctol, Doxidan, Dulcolax). Taking stimulant laxatives regularly or in large amounts can cause side effects, including low potassium levels. Thus, you should take these drugs carefully if you must use them regularly.  PRESCRIPTIONS that treat severe constipation. They are expensive, but may be recommended if you do not respond to other treatments.  Lubiprostone (Amitiza)  Linaclotide (Linzess)  Plecanatide (Trulance, Motegrity)  Tenapanor (Ibsrela)    

## 2024-01-30 ENCOUNTER — Other Ambulatory Visit (HOSPITAL_BASED_OUTPATIENT_CLINIC_OR_DEPARTMENT_OTHER): Payer: Self-pay

## 2024-01-30 ENCOUNTER — Encounter: Payer: Self-pay | Admitting: Family Medicine

## 2024-01-30 MED ORDER — GLIPIZIDE 5 MG PO TABS
5.0000 mg | ORAL_TABLET | Freq: Two times a day (BID) | ORAL | 1 refills | Status: DC
  Filled 2024-01-30: qty 90, 45d supply, fill #0
  Filled 2024-03-18: qty 90, 45d supply, fill #1

## 2024-01-30 NOTE — Progress Notes (Signed)
 Small amount of leukocytes on your urine sample so we are culturing it - will take about 2 days to result.   A1c has gone up some - increase your glipizide to a whole tablet twice a day and we will see if that helps!  Potassium is mildly elevated - minimize potassium in your diet. Let's recheck next week.   Kidney function has mildly decreased, but stable overall - stay well hydrated; we will keep an eye on this!

## 2024-01-30 NOTE — Addendum Note (Signed)
 Addended by: Hyman Hopes B on: 01/30/2024 05:03 PM   Modules accepted: Orders

## 2024-01-31 LAB — URINALYSIS W MICROSCOPIC + REFLEX CULTURE
Bacteria, UA: NONE SEEN /HPF
Bilirubin Urine: NEGATIVE
Glucose, UA: NEGATIVE
Hgb urine dipstick: NEGATIVE
Hyaline Cast: NONE SEEN /LPF
Ketones, ur: NEGATIVE
Nitrites, Initial: NEGATIVE
Protein, ur: NEGATIVE
RBC / HPF: NONE SEEN /HPF (ref 0–2)
Specific Gravity, Urine: 1.024 (ref 1.001–1.035)
pH: 5.5 (ref 5.0–8.0)

## 2024-01-31 LAB — URINE CULTURE
MICRO NUMBER:: 16278917
Result:: NO GROWTH
SPECIMEN QUALITY:: ADEQUATE

## 2024-01-31 LAB — CULTURE INDICATED

## 2024-02-01 ENCOUNTER — Other Ambulatory Visit (HOSPITAL_BASED_OUTPATIENT_CLINIC_OR_DEPARTMENT_OTHER): Payer: Self-pay

## 2024-02-05 DIAGNOSIS — R152 Fecal urgency: Secondary | ICD-10-CM | POA: Diagnosis not present

## 2024-02-05 DIAGNOSIS — M6281 Muscle weakness (generalized): Secondary | ICD-10-CM | POA: Diagnosis not present

## 2024-02-05 DIAGNOSIS — M9905 Segmental and somatic dysfunction of pelvic region: Secondary | ICD-10-CM | POA: Diagnosis not present

## 2024-02-05 DIAGNOSIS — N3941 Urge incontinence: Secondary | ICD-10-CM | POA: Diagnosis not present

## 2024-02-13 DIAGNOSIS — N3941 Urge incontinence: Secondary | ICD-10-CM | POA: Diagnosis not present

## 2024-02-13 DIAGNOSIS — M6281 Muscle weakness (generalized): Secondary | ICD-10-CM | POA: Diagnosis not present

## 2024-02-13 DIAGNOSIS — M9905 Segmental and somatic dysfunction of pelvic region: Secondary | ICD-10-CM | POA: Diagnosis not present

## 2024-02-13 DIAGNOSIS — R152 Fecal urgency: Secondary | ICD-10-CM | POA: Diagnosis not present

## 2024-02-21 ENCOUNTER — Other Ambulatory Visit (HOSPITAL_BASED_OUTPATIENT_CLINIC_OR_DEPARTMENT_OTHER): Payer: Self-pay

## 2024-02-28 ENCOUNTER — Ambulatory Visit

## 2024-02-28 DIAGNOSIS — E538 Deficiency of other specified B group vitamins: Secondary | ICD-10-CM

## 2024-02-28 MED ORDER — CYANOCOBALAMIN 1000 MCG/ML IJ SOLN
1000.0000 ug | Freq: Once | INTRAMUSCULAR | Status: AC
  Administered 2024-02-28: 1000 ug via INTRAMUSCULAR

## 2024-02-28 NOTE — Progress Notes (Signed)
 Diana George is a 78 y.o. female presents to the office today for b12 injections, per physician's orders. Original order:  "07/31/23: Vitamin B12 deficiency: monthly injections, due today, doing well, no concerns."  Cyanocobalamin  (med), 1000 mg/ml (dose),  IM (route) was administered left deltoid (location) today. Patient tolerated injection.  Patient next injection due: in 4 weeks, appt made Yes for 03/28/24.   Joye Nobles

## 2024-03-12 DIAGNOSIS — N3941 Urge incontinence: Secondary | ICD-10-CM | POA: Diagnosis not present

## 2024-03-12 DIAGNOSIS — R152 Fecal urgency: Secondary | ICD-10-CM | POA: Diagnosis not present

## 2024-03-12 DIAGNOSIS — M9905 Segmental and somatic dysfunction of pelvic region: Secondary | ICD-10-CM | POA: Diagnosis not present

## 2024-03-12 DIAGNOSIS — M6281 Muscle weakness (generalized): Secondary | ICD-10-CM | POA: Diagnosis not present

## 2024-03-19 DIAGNOSIS — M9905 Segmental and somatic dysfunction of pelvic region: Secondary | ICD-10-CM | POA: Diagnosis not present

## 2024-03-19 DIAGNOSIS — N3941 Urge incontinence: Secondary | ICD-10-CM | POA: Diagnosis not present

## 2024-03-19 DIAGNOSIS — M6281 Muscle weakness (generalized): Secondary | ICD-10-CM | POA: Diagnosis not present

## 2024-03-19 DIAGNOSIS — R152 Fecal urgency: Secondary | ICD-10-CM | POA: Diagnosis not present

## 2024-03-25 DIAGNOSIS — R152 Fecal urgency: Secondary | ICD-10-CM | POA: Diagnosis not present

## 2024-03-25 DIAGNOSIS — M6281 Muscle weakness (generalized): Secondary | ICD-10-CM | POA: Diagnosis not present

## 2024-03-25 DIAGNOSIS — M9905 Segmental and somatic dysfunction of pelvic region: Secondary | ICD-10-CM | POA: Diagnosis not present

## 2024-03-25 DIAGNOSIS — N3941 Urge incontinence: Secondary | ICD-10-CM | POA: Diagnosis not present

## 2024-03-28 ENCOUNTER — Ambulatory Visit (INDEPENDENT_AMBULATORY_CARE_PROVIDER_SITE_OTHER): Admitting: *Deleted

## 2024-03-28 DIAGNOSIS — E538 Deficiency of other specified B group vitamins: Secondary | ICD-10-CM

## 2024-03-28 MED ORDER — CYANOCOBALAMIN 1000 MCG/ML IJ SOLN
1000.0000 ug | Freq: Once | INTRAMUSCULAR | Status: AC
  Administered 2024-03-28: 1000 ug via INTRAMUSCULAR

## 2024-03-28 NOTE — Progress Notes (Signed)
 Patient here for monthly b12 injection per physicians order.  Injection given in left deltoid and patient tolerated well.

## 2024-04-03 ENCOUNTER — Other Ambulatory Visit: Payer: Self-pay | Admitting: Family Medicine

## 2024-04-03 ENCOUNTER — Other Ambulatory Visit: Payer: Self-pay

## 2024-04-03 ENCOUNTER — Other Ambulatory Visit: Payer: Self-pay | Admitting: Internal Medicine

## 2024-04-03 ENCOUNTER — Other Ambulatory Visit (HOSPITAL_BASED_OUTPATIENT_CLINIC_OR_DEPARTMENT_OTHER): Payer: Self-pay

## 2024-04-03 DIAGNOSIS — G250 Essential tremor: Secondary | ICD-10-CM

## 2024-04-03 MED ORDER — PROPRANOLOL HCL 20 MG PO TABS
20.0000 mg | ORAL_TABLET | Freq: Two times a day (BID) | ORAL | 1 refills | Status: DC
  Filled 2024-04-03: qty 180, 90d supply, fill #0
  Filled 2024-07-03: qty 180, 90d supply, fill #1

## 2024-04-03 MED ORDER — SIMVASTATIN 20 MG PO TABS
20.0000 mg | ORAL_TABLET | Freq: Every day | ORAL | 1 refills | Status: DC
  Filled 2024-04-03: qty 90, 90d supply, fill #0
  Filled 2024-07-03: qty 90, 90d supply, fill #1

## 2024-04-08 ENCOUNTER — Other Ambulatory Visit (HOSPITAL_BASED_OUTPATIENT_CLINIC_OR_DEPARTMENT_OTHER): Payer: Self-pay

## 2024-04-09 ENCOUNTER — Encounter (HOSPITAL_BASED_OUTPATIENT_CLINIC_OR_DEPARTMENT_OTHER): Payer: Self-pay

## 2024-04-09 ENCOUNTER — Other Ambulatory Visit (HOSPITAL_BASED_OUTPATIENT_CLINIC_OR_DEPARTMENT_OTHER): Payer: Self-pay

## 2024-04-14 ENCOUNTER — Other Ambulatory Visit (HOSPITAL_BASED_OUTPATIENT_CLINIC_OR_DEPARTMENT_OTHER): Payer: Self-pay

## 2024-04-14 ENCOUNTER — Other Ambulatory Visit: Payer: Self-pay | Admitting: Family Medicine

## 2024-04-14 MED ORDER — SERTRALINE HCL 100 MG PO TABS
100.0000 mg | ORAL_TABLET | Freq: Every day | ORAL | 1 refills | Status: DC
  Filled 2024-04-14: qty 90, 90d supply, fill #0
  Filled 2024-07-19: qty 90, 90d supply, fill #1

## 2024-04-15 ENCOUNTER — Other Ambulatory Visit (HOSPITAL_BASED_OUTPATIENT_CLINIC_OR_DEPARTMENT_OTHER): Payer: Self-pay

## 2024-04-16 DIAGNOSIS — M9905 Segmental and somatic dysfunction of pelvic region: Secondary | ICD-10-CM | POA: Diagnosis not present

## 2024-04-16 DIAGNOSIS — N3941 Urge incontinence: Secondary | ICD-10-CM | POA: Diagnosis not present

## 2024-04-16 DIAGNOSIS — M6281 Muscle weakness (generalized): Secondary | ICD-10-CM | POA: Diagnosis not present

## 2024-04-16 DIAGNOSIS — R152 Fecal urgency: Secondary | ICD-10-CM | POA: Diagnosis not present

## 2024-04-23 DIAGNOSIS — R152 Fecal urgency: Secondary | ICD-10-CM | POA: Diagnosis not present

## 2024-04-23 DIAGNOSIS — M9905 Segmental and somatic dysfunction of pelvic region: Secondary | ICD-10-CM | POA: Diagnosis not present

## 2024-04-23 DIAGNOSIS — M6281 Muscle weakness (generalized): Secondary | ICD-10-CM | POA: Diagnosis not present

## 2024-04-23 DIAGNOSIS — N3941 Urge incontinence: Secondary | ICD-10-CM | POA: Diagnosis not present

## 2024-04-28 ENCOUNTER — Other Ambulatory Visit: Payer: Self-pay | Admitting: Family Medicine

## 2024-04-28 DIAGNOSIS — E1165 Type 2 diabetes mellitus with hyperglycemia: Secondary | ICD-10-CM

## 2024-04-29 ENCOUNTER — Ambulatory Visit: Admitting: *Deleted

## 2024-04-29 ENCOUNTER — Other Ambulatory Visit (HOSPITAL_BASED_OUTPATIENT_CLINIC_OR_DEPARTMENT_OTHER): Payer: Self-pay

## 2024-04-29 DIAGNOSIS — E538 Deficiency of other specified B group vitamins: Secondary | ICD-10-CM

## 2024-04-29 MED ORDER — GLIPIZIDE 5 MG PO TABS
5.0000 mg | ORAL_TABLET | Freq: Two times a day (BID) | ORAL | 1 refills | Status: DC
  Filled 2024-04-29: qty 180, 90d supply, fill #0
  Filled 2024-07-19: qty 180, 90d supply, fill #1

## 2024-04-29 MED ORDER — CYANOCOBALAMIN 1000 MCG/ML IJ SOLN
1000.0000 ug | Freq: Once | INTRAMUSCULAR | Status: AC
  Administered 2024-04-29: 1000 ug via INTRAMUSCULAR

## 2024-04-29 NOTE — Progress Notes (Signed)
 Patient here for monthly b12 injection per physicians order.  Injection given in right deltoid and patient tolerated well.  Next injection scheduled.

## 2024-05-28 ENCOUNTER — Other Ambulatory Visit (HOSPITAL_BASED_OUTPATIENT_CLINIC_OR_DEPARTMENT_OTHER): Payer: Self-pay

## 2024-05-28 ENCOUNTER — Encounter: Payer: Self-pay | Admitting: Pharmacist

## 2024-05-28 ENCOUNTER — Other Ambulatory Visit: Payer: Self-pay | Admitting: Pharmacist

## 2024-05-28 DIAGNOSIS — E1165 Type 2 diabetes mellitus with hyperglycemia: Secondary | ICD-10-CM

## 2024-05-28 MED ORDER — SITAGLIPTIN PHOSPHATE 100 MG PO TABS
100.0000 mg | ORAL_TABLET | Freq: Every day | ORAL | 0 refills | Status: DC
  Filled 2024-05-28: qty 90, 90d supply, fill #0

## 2024-05-28 NOTE — Progress Notes (Signed)
 Pharmacy Quality Measure Review  This patient is appearing on a report for being at risk of failing the adherence measure for cholesterol (statin) and diabetes medications this calendar year.   Medication: simvastatin  Last fill date: 12/31/2023 for 90 day supply per adherence report.   Reviewed recent refill history in Dr Annemarie database. Actual last refill date was 04/03/2024 for 90 day supply. Patient has 1 refill remaining. Next appointment with PCP is 07/31/2024 fro CPE.    Medication: Januvia  Last fill date: 02/21/2024 for 90 day supply. Slightly past due but noted not refills remaining on current Rx.   Insurance report was not up to date fro simvastatin .  Sent in updated Rx for Januvia  to get her thru until she see PCP in October.   Madelin Ray, PharmD Clinical Pharmacist [facility]

## 2024-05-30 ENCOUNTER — Ambulatory Visit: Admitting: Neurology

## 2024-05-30 DIAGNOSIS — E538 Deficiency of other specified B group vitamins: Secondary | ICD-10-CM | POA: Diagnosis not present

## 2024-05-30 MED ORDER — CYANOCOBALAMIN 1000 MCG/ML IJ SOLN
1000.0000 ug | Freq: Once | INTRAMUSCULAR | Status: AC
  Administered 2024-05-30: 1000 ug via INTRAMUSCULAR

## 2024-05-30 NOTE — Progress Notes (Signed)
 Patient is here for a vitamin B12 injection per orders from Waddell Mon, NP:  07/31/23: Vitamin B12 deficiency: monthly injections, due today, doing well, no concerns.  Last injection: 04/29/2024. Denies gastrointestinal problems or dizziness.  B12 injection to left deltoid with no apparent complications.  Scheduled next injection in one month: 07/03/2024.

## 2024-06-05 DIAGNOSIS — K080 Exfoliation of teeth due to systemic causes: Secondary | ICD-10-CM | POA: Diagnosis not present

## 2024-06-10 ENCOUNTER — Telehealth: Payer: Self-pay | Admitting: *Deleted

## 2024-06-10 ENCOUNTER — Ambulatory Visit: Admitting: *Deleted

## 2024-06-10 ENCOUNTER — Other Ambulatory Visit (INDEPENDENT_AMBULATORY_CARE_PROVIDER_SITE_OTHER)

## 2024-06-10 VITALS — BP 100/67 | HR 60 | Temp 97.8°F | Resp 16 | Ht 66.0 in | Wt 209.8 lb

## 2024-06-10 DIAGNOSIS — Z0000 Encounter for general adult medical examination without abnormal findings: Secondary | ICD-10-CM | POA: Diagnosis not present

## 2024-06-10 DIAGNOSIS — E1165 Type 2 diabetes mellitus with hyperglycemia: Secondary | ICD-10-CM

## 2024-06-10 DIAGNOSIS — H9313 Tinnitus, bilateral: Secondary | ICD-10-CM

## 2024-06-10 LAB — MICROALBUMIN / CREATININE URINE RATIO
Creatinine,U: 207.7 mg/dL
Microalb Creat Ratio: 8.9 mg/g (ref 0.0–30.0)
Microalb, Ur: 1.8 mg/dL (ref 0.0–1.9)

## 2024-06-10 NOTE — Patient Instructions (Addendum)
 Diana George , Thank you for taking time out of your busy schedule to complete your Annual Wellness Visit with me. I enjoyed our conversation and look forward to speaking with you again next year. I, as well as your care team,  appreciate your ongoing commitment to your health goals. Please review the following plan we discussed and let me know if I can assist you in the future. Your Game plan/ To Do List    Referrals: If you haven't heard from the office you've been referred to, please reach out to them at the phone provided.   Please schedule your bone density at [facility].  Follow up Visits: Next Medicare AWV with our clinical staff:   06/12/25 11am  Next Office Visit with your provider: 07/31/24 11am  Clinician Recommendations:  Aim for 30 minutes of exercise or brisk walking, 6-8 glasses of water, and 5 servings of fruits and vegetables each day.   You will need to get the following vaccines at your local pharmacy: Flu      This is a list of the screening recommended for you and due dates:  Health Maintenance  Topic Date Due   Yearly kidney health urinalysis for diabetes  11/23/2021   Medicare Annual Wellness Visit  05/23/2024   Complete foot exam   07/30/2024   Hemoglobin A1C  07/30/2024   Eye exam for diabetics  10/08/2024   Yearly kidney function blood test for diabetes  01/28/2025   Pneumococcal Vaccine for age over 70  Completed   DEXA scan (bone density measurement)  Completed   Zoster (Shingles) Vaccine  Completed   Hepatitis B Vaccine  Aged Out   HPV Vaccine  Aged Out   Meningitis B Vaccine  Aged Out   DTaP/Tdap/Td vaccine  Discontinued   Flu Shot  Discontinued   Colon Cancer Screening  Discontinued   COVID-19 Vaccine  Discontinued   Hepatitis C Screening  Discontinued    Advanced directives: (Copy Requested) Please bring a copy of your health care power of attorney and living will to the office to be added to your chart at your convenience. You can mail to  [facility] [address] [place], [place] [address] or email to [email or web address] Advance Care Planning is important because it:  [x]  Makes sure you receive the medical care that is consistent with your values, goals, and preferences  [x]  It provides guidance to your family and loved ones and reduces their decisional burden about whether or not they are making the right decisions based on your wishes.  Follow the link provided in your after visit summary or read over the paperwork we have mailed to you to help you started getting your Advance Directives in place. If you need assistance in completing these, please reach out to us  so that we can help you!  See attachments for Preventive Care and Fall Prevention Tips.

## 2024-06-10 NOTE — Progress Notes (Addendum)
 Subjective:   Diana George is a 78 y.o. who presents for a Medicare Wellness preventive visit.  As a reminder, Annual Wellness Visits don't include a physical exam, and some assessments may be limited, especially if this visit is performed virtually. We may recommend an in-person follow-up visit with your provider if needed.  Visit Complete: In person  Persons Participating in Visit: Patient.  AWV Questionnaire: No: Patient Medicare AWV questionnaire was not completed prior to this visit.  Cardiac Risk Factors include: advanced age (>61men, >7 women);dyslipidemia;diabetes mellitus;Other (see comment), Risk factor comments: OSA     Objective:    Today's Vitals   06/10/24 1104  BP: 100/67  Pulse: 60  Resp: 16  Temp: 97.8 F (36.6 C)  TempSrc: Oral  SpO2: 95%  Weight: 209 lb 12.8 oz (95.2 kg)  Height: 5' 6 (1.676 m)   Body mass index is 33.86 kg/m.     06/10/2024   11:28 AM 09/12/2023    9:14 AM 05/24/2023    3:11 PM 05/22/2022   10:09 AM 10/08/2020    9:19 AM 03/11/2020    2:49 PM 06/28/2015   10:57 AM  Advanced Directives  Does Patient Have a Medical Advance Directive? Yes Yes Yes Yes Yes Yes Yes   Type of Estate agent of [place];Living will Living will Healthcare Power of [place];Living will Healthcare Power of [place];Living will Healthcare Power of [place];Living will Healthcare Power of [place];Living will Healthcare Power of [place];Living will   Does patient want to make changes to medical advance directive? No - Patient declined  No - Patient declined No - Patient declined  No - Patient declined No - Patient declined   Copy of Healthcare Power of Attorney in Chart? No - copy requested  No - copy requested No - copy requested No - copy requested No - copy requested      Data saved with a previous flowsheet row definition    Current Medications (verified) Outpatient Encounter Medications as of 06/10/2024  Medication Sig    Cholecalciferol (VITAMIN D ) 2000 UNITS CAPS Take 1 capsule by mouth 2 (two) times daily.   CYANOCOBALAMIN  IJ Inject as directed every 30 (thirty) days.   glipiZIDE  (GLUCOTROL ) 5 MG tablet Take 1 tablet (5 mg total) by mouth 2 (two) times daily with breakfast and lunch.   hydrocortisone  (ANUSOL -HC) 2.5 % rectal cream Place 1 Application rectally 2 (two) times daily.   hyoscyamine  (LEVSIN ) 0.125 MG tablet Take 0.125 mg by mouth as needed.   propranolol  (INDERAL ) 20 MG tablet Take 1 tablet (20 mg total) by mouth 2 (two) times daily.   sertraline  (ZOLOFT ) 100 MG tablet Take 1 tablet (100 mg total) by mouth daily.   simvastatin  (ZOCOR ) 20 MG tablet Take 1 tablet (20 mg total) by mouth daily.   sitaGLIPtin  (JANUVIA ) 100 MG tablet Take 1 tablet (100 mg total) by mouth daily.   No facility-administered encounter medications on file as of 06/10/2024.    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   Hyperlipidemia    Renal stone    Sleep apnea    uses cpap   Past Surgical History:  Procedure Laterality Date   CATARACT EXTRACTION W/ INTRAOCULAR LENS  IMPLANT, BILATERAL  2010   heart ablation  2010   LEG SURGERY Left 2011  fracture with hardware   STONE EXTRACTION WITH BASKET  2016, 1977   TUBAL LIGATION  1977   Family History  Problem Relation Age of Onset   Alzheimer's disease Mother    Alzheimer's disease Father    Heart disease Father    Colon cancer Father 38   Tremor Father    Breast cancer Paternal Aunt    Social History   Socioeconomic History   Marital status: Divorced    Spouse name: Not on file   Number of children: Not on file   Years of education: Not on file   Highest education level: Bachelor's degree (e.g., BA, AB, BS)  Occupational History   Occupation: retired  Tobacco Use   Smoking status: Never    Smokeless tobacco: Never  Vaping Use   Vaping status: Never Used  Substance and Sexual Activity   Alcohol use: No    Alcohol/week: 0.0 standard drinks of alcohol   Drug use: No   Sexual activity: Not on file  Other Topics Concern   Not on file  Social History Narrative   Not on file   Social Drivers of Health   Financial Resource Strain: Low Risk  (06/10/2024)   Overall Financial Resource Strain (CARDIA)    Difficulty of Paying Living Expenses: Not hard at all  Food Insecurity: No Food Insecurity (06/10/2024)   Hunger Vital Sign    Worried About Running Out of Food in the Last Year: Never true    Ran Out of Food in the Last Year: Never true  Transportation Needs: No Transportation Needs (06/10/2024)   PRAPARE - Administrator, Civil Service (Medical): No    Lack of Transportation (Non-Medical): No  Physical Activity: Inactive (06/10/2024)   Exercise Vital Sign    Days of Exercise per Week: 0 days    Minutes of Exercise per Session: 0 min  Stress: No Stress Concern Present (06/10/2024)   [employer] of Occupational Health - Occupational Stress Questionnaire    Feeling of Stress: Not at all  Social Connections: Moderately Integrated (06/10/2024)   Social Connection and Isolation Panel    Frequency of Communication with Friends and Family: More than three times a week    Frequency of Social Gatherings with Friends and Family: More than three times a week    Attends Religious Services: More than 4 times per year    Active Member of [employer] or Organizations: Yes    Attends Engineer, structural: More than 4 times per year    Marital Status: Divorced    Tobacco Counseling Counseling given: Not Answered    Clinical Intake:  Pre-visit preparation completed: Yes  Pain : No/denies pain     BMI - recorded: 33.86 Nutritional Risks: None Diabetes: Yes CBG done?: No  Lab Results  Component Value Date   HGBA1C 7.4 (H) 01/29/2024   HGBA1C 6.9 (H)  07/31/2023   HGBA1C 6.8 (H) 04/30/2023   How often do you need to have someone help you when you read instructions, pamphlets, or other written materials from your doctor or pharmacy?: 1 - Never Interpreter Needed?: No Information entered by :: Lolita Libra, CMA  Activities of Daily Living     06/10/2024   11:17 AM  In your present state of health, do you have any difficulty performing the following activities:  Hearing? 0  Comment has bilateral ringing in ears, left worse than right.  Vision? 0  Difficulty concentrating or making decisions? 0  Walking or  climbing stairs? 0  Dressing or bathing? 0  Doing errands, shopping? 0  Preparing Food and eating ? N  Using the Toilet? N  In the past six months, have you accidently leaked urine? Y  Comment PCP is aware and has been to PT and is improving  Do you have problems with loss of bowel control? Y  Comment Has one episode a month, if eats greasy foods  Managing your Medications? N  Managing your Finances? N  Housekeeping or managing your Housekeeping? N    Patient Care Team: Almarie Waddell NOVAK, NP as PCP - General (Family Medicine) [facility], [facility]  I have updated your Care Teams any recent Medical Services you may have received from other providers in the past year.     Assessment:   This is a routine wellness examination for Diana George.  Hearing/Vision screen Hearing Screening - Comments:: Denies hearing difficulties.  Vision Screening - Comments:: Wears RX glasses -- up to date with routine eye exams.    Goals Addressed   None    Depression Screen     06/10/2024   11:22 AM 01/29/2024   11:24 AM 07/31/2023   11:16 AM 05/24/2023    3:07 PM 04/30/2023    1:40 PM 02/13/2023    1:07 PM 01/03/2023   12:19 PM  PHQ 2/9 Scores  PHQ - 2 Score 1 0 1 0 2 2 2   PHQ- 9 Score 7 4 5  10 7 5     Fall Risk     06/10/2024   11:11 AM 01/29/2024   11:24 AM 07/31/2023   11:16 AM 05/24/2023    3:00 PM 04/30/2023    1:40 PM   Fall Risk   Falls in the past year? 1 1 1 1 1   Comment    missed the step from getting out of a high bed   Number falls in past yr: 1 1 1  0 1  Injury with Fall? 1 0 0 0 0  Risk for fall due to : Medication side effect History of fall(s) History of fall(s) History of fall(s) History of fall(s)  Follow up Falls evaluation completed Falls evaluation completed Falls evaluation completed Falls evaluation completed Falls evaluation completed    MEDICARE RISK AT HOME:  Medicare Risk at Home Any stairs in or around the home?: Yes If so, are there any without handrails?: No Home free of loose throw rugs in walkways, pet beds, electrical cords, etc?: Yes Adequate lighting in your home to reduce risk of falls?: Yes Life alert?: No Use of a cane, walker or w/c?: Yes Grab bars in the bathroom?: Yes Shower chair or bench in shower?: Yes Elevated toilet seat or a handicapped toilet?: Yes  TIMED UP AND GO:  Was the test performed?  Yes  Length of time to ambulate 10 feet: 5 sec Gait steady and fast without use of assistive device  Cognitive Function: 6CIT completed        06/10/2024   11:29 AM 05/24/2023    3:13 PM 05/22/2022   10:12 AM  6CIT Screen  What Year? 0 points 0 points   What month? 0 points 0 points   What time? 0 points 0 points 0 points  Count back from 20 0 points 0 points 0 points  Months in reverse 0 points 0 points 0 points  Repeat phrase 0 points 0 points 4 points  Total Score 0 points 0 points     Immunizations Immunization History  Administered  Date(s) Administered   Influenza Split 08/09/2012, 08/30/2016   Influenza,inj,Quad PF,6+ Mos 08/27/2013, 08/19/2014, 08/06/2015, 08/07/2017, 08/16/2018, 07/09/2019, 08/04/2020, 08/22/2021, 12/06/2022   PFIZER(Purple Top)SARS-COV-2 Vaccination 11/21/2019, 12/12/2019, 08/19/2020, 05/26/2021   Pneumococcal Conjugate-13 02/03/2016   Pneumococcal Polysaccharide-23 11/13/2009, 10/11/2017   Tdap 10/27/2011, 09/06/2023   Zoster  Recombinant(Shingrix) 11/13/2018, 05/21/2020   Zoster, Live 12/06/2010    Screening Tests Health Maintenance  Topic Date Due   Diabetic kidney evaluation - Urine ACR  11/23/2021   Medicare Annual Wellness (AWV)  05/23/2024   FOOT EXAM  07/30/2024   HEMOGLOBIN A1C  07/30/2024   OPHTHALMOLOGY EXAM  10/08/2024   Diabetic kidney evaluation - eGFR measurement  01/28/2025   Pneumococcal Vaccine: 50+ Years  Completed   DEXA SCAN  Completed   Zoster Vaccines- Shingrix  Completed   Hepatitis B Vaccines  Aged Out   HPV VACCINES  Aged Out   Meningococcal B Vaccine  Aged Out   DTaP/Tdap/Td  Discontinued   INFLUENZA VACCINE  Discontinued   Colonoscopy  Discontinued   COVID-19 Vaccine  Discontinued   Hepatitis C Screening  Discontinued    Health Maintenance  Health Maintenance Due  Topic Date Due   Diabetic kidney evaluation - Urine ACR  11/23/2021   Medicare Annual Wellness (AWV)  05/23/2024   Health Maintenance Items Addressed: MALB done today. Pt will call [facility] to schedule DEXA. Will get flu vaccine at pharmacy.  Additional Screening:  Vision Screening: Recommended annual ophthalmology exams for early detection of glaucoma and other disorders of the eye. Would you like a referral to an eye doctor? No    Dental Screening: Recommended annual dental exams for proper oral hygiene  Community Resource Referral / Chronic Care Management: CRR required this visit?  No   CCM required this visit?  No   Plan:    I have personally reviewed and noted the following in the patient's chart:   Medical and social history Use of alcohol, tobacco or illicit drugs  Current medications and supplements including opioid prescriptions. Patient is not currently taking opioid prescriptions. Functional ability and status Nutritional status Physical activity Advanced directives List of other physicians Hospitalizations, surgeries, and ER visits in previous 12  months Vitals Screenings to include cognitive, depression, and falls Referrals and appointments  In addition, I have reviewed and discussed with patient certain preventive protocols, quality metrics, and best practice recommendations. A written personalized care plan for preventive services as well as general preventive health recommendations were provided to patient.   Lolita Libra, CMA   06/10/2024   After Visit Summary: (In Person-Printed) AVS printed and given to the patient  Notes: see phone note

## 2024-06-10 NOTE — Telephone Encounter (Signed)
 Pt had AWV today.  Reports ringing in both ears; left > right. Has been present > 29yr. Doesn't think she has discussed with PCP yet.  Also scored 7 on depression screen. Has been to therapy/counseling and doesn't feel it was beneficial. Pt also mentions lack of motivation in daily activities.  Reports loss of bowel control once a month and typically after eating greasy foods.   Ok to address at next OV on 10/2 or any new recommendation?

## 2024-06-11 ENCOUNTER — Ambulatory Visit: Payer: Self-pay | Admitting: Family Medicine

## 2024-06-13 NOTE — Telephone Encounter (Signed)
 Notified pt.  She plans to wait until follow up in October to discuss depression, lack of motivation and will reach out for earlier appt if symptoms worsen. She is agreeable to proceed with referral to ENT and referral has been placed. She will consider contacting GI about bowel control.

## 2024-06-13 NOTE — Telephone Encounter (Signed)
 Almarie Waddell NOVAK, NP to Me (Selected Message)    06/10/24 12:14 PM Offer ENT referral for the tinnitus.    Recommend she follow-up with her GI doctor regarding the stool changes.   If she feels mood is stable as is, okay to wait until next visit, otherwise, come in sooner and we can try adjusting SSRI.    Thank you!

## 2024-06-17 DIAGNOSIS — F330 Major depressive disorder, recurrent, mild: Secondary | ICD-10-CM | POA: Diagnosis not present

## 2024-07-03 ENCOUNTER — Ambulatory Visit

## 2024-07-03 DIAGNOSIS — E538 Deficiency of other specified B group vitamins: Secondary | ICD-10-CM

## 2024-07-03 MED ORDER — CYANOCOBALAMIN 1000 MCG/ML IJ SOLN
1000.0000 ug | Freq: Once | INTRAMUSCULAR | Status: AC
Start: 2024-07-03 — End: 2024-07-03
  Administered 2024-07-03: 1000 ug via INTRAMUSCULAR

## 2024-07-03 NOTE — Progress Notes (Addendum)
 Pt here for monthly B12 injection per original order dated: 07/31/23: Vitamin B12 deficiency: monthly injections, due today, doing well, no concerns.   Last B12 injection:05/30/24  B12 1000mcg given IM, and pt tolerated injection well.  Next B12 injection will be administered during physical with provider on 07/01/24.

## 2024-07-19 ENCOUNTER — Other Ambulatory Visit: Payer: Self-pay | Admitting: Internal Medicine

## 2024-07-21 ENCOUNTER — Other Ambulatory Visit (HOSPITAL_BASED_OUTPATIENT_CLINIC_OR_DEPARTMENT_OTHER): Payer: Self-pay

## 2024-07-21 ENCOUNTER — Other Ambulatory Visit: Payer: Self-pay

## 2024-07-21 MED ORDER — SIMVASTATIN 20 MG PO TABS
20.0000 mg | ORAL_TABLET | Freq: Every day | ORAL | 1 refills | Status: AC
  Filled 2024-07-21 – 2024-10-06 (×2): qty 90, 90d supply, fill #0

## 2024-07-24 DIAGNOSIS — H903 Sensorineural hearing loss, bilateral: Secondary | ICD-10-CM | POA: Diagnosis not present

## 2024-07-24 DIAGNOSIS — H9313 Tinnitus, bilateral: Secondary | ICD-10-CM | POA: Diagnosis not present

## 2024-07-24 DIAGNOSIS — H9193 Unspecified hearing loss, bilateral: Secondary | ICD-10-CM | POA: Diagnosis not present

## 2024-07-31 ENCOUNTER — Encounter: Payer: Self-pay | Admitting: Family Medicine

## 2024-07-31 ENCOUNTER — Ambulatory Visit: Admitting: Family Medicine

## 2024-07-31 VITALS — BP 110/62 | HR 67 | Ht 66.0 in | Wt 210.0 lb

## 2024-07-31 DIAGNOSIS — Z0000 Encounter for general adult medical examination without abnormal findings: Secondary | ICD-10-CM | POA: Diagnosis not present

## 2024-07-31 DIAGNOSIS — E538 Deficiency of other specified B group vitamins: Secondary | ICD-10-CM | POA: Diagnosis not present

## 2024-07-31 DIAGNOSIS — R2689 Other abnormalities of gait and mobility: Secondary | ICD-10-CM

## 2024-07-31 DIAGNOSIS — E1165 Type 2 diabetes mellitus with hyperglycemia: Secondary | ICD-10-CM

## 2024-07-31 DIAGNOSIS — E1169 Type 2 diabetes mellitus with other specified complication: Secondary | ICD-10-CM

## 2024-07-31 DIAGNOSIS — E785 Hyperlipidemia, unspecified: Secondary | ICD-10-CM

## 2024-07-31 DIAGNOSIS — G4733 Obstructive sleep apnea (adult) (pediatric): Secondary | ICD-10-CM

## 2024-07-31 DIAGNOSIS — Z7984 Long term (current) use of oral hypoglycemic drugs: Secondary | ICD-10-CM

## 2024-07-31 LAB — CBC WITH DIFFERENTIAL/PLATELET
Basophils Absolute: 0 K/uL (ref 0.0–0.1)
Basophils Relative: 0.6 % (ref 0.0–3.0)
Eosinophils Absolute: 0.2 K/uL (ref 0.0–0.7)
Eosinophils Relative: 2 % (ref 0.0–5.0)
HCT: 40.3 % (ref 36.0–46.0)
Hemoglobin: 13.4 g/dL (ref 12.0–15.0)
Lymphocytes Relative: 22.6 % (ref 12.0–46.0)
Lymphs Abs: 1.7 K/uL (ref 0.7–4.0)
MCHC: 33.3 g/dL (ref 30.0–36.0)
MCV: 95.5 fl (ref 78.0–100.0)
Monocytes Absolute: 0.6 K/uL (ref 0.1–1.0)
Monocytes Relative: 7.4 % (ref 3.0–12.0)
Neutro Abs: 5.1 K/uL (ref 1.4–7.7)
Neutrophils Relative %: 67.4 % (ref 43.0–77.0)
Platelets: 245 K/uL (ref 150.0–400.0)
RBC: 4.22 Mil/uL (ref 3.87–5.11)
RDW: 13.6 % (ref 11.5–15.5)
WBC: 7.6 K/uL (ref 4.0–10.5)

## 2024-07-31 LAB — COMPREHENSIVE METABOLIC PANEL WITH GFR
ALT: 20 U/L (ref 0–35)
AST: 20 U/L (ref 0–37)
Albumin: 4.1 g/dL (ref 3.5–5.2)
Alkaline Phosphatase: 78 U/L (ref 39–117)
BUN: 17 mg/dL (ref 6–23)
CO2: 27 meq/L (ref 19–32)
Calcium: 9.5 mg/dL (ref 8.4–10.5)
Chloride: 105 meq/L (ref 96–112)
Creatinine, Ser: 1.07 mg/dL (ref 0.40–1.20)
GFR: 49.98 mL/min — ABNORMAL LOW (ref >60.00)
Glucose, Bld: 177 mg/dL — ABNORMAL HIGH (ref 70–99)
Potassium: 4.5 meq/L (ref 3.5–5.1)
Sodium: 141 meq/L (ref 135–145)
Total Bilirubin: 0.7 mg/dL (ref 0.2–1.2)
Total Protein: 6.5 g/dL (ref 6.0–8.3)

## 2024-07-31 LAB — LIPID PANEL
Cholesterol: 145 mg/dL (ref 0–200)
HDL: 48.9 mg/dL (ref >39.00)
LDL Cholesterol: 79 mg/dL (ref 0–99)
NonHDL: 96.58
Total CHOL/HDL Ratio: 3
Triglycerides: 86 mg/dL (ref 0.0–149.0)
VLDL: 17.2 mg/dL (ref 0.0–40.0)

## 2024-07-31 LAB — TSH: TSH: 1.55 u[IU]/mL (ref 0.35–5.50)

## 2024-07-31 LAB — HEMOGLOBIN A1C: Hgb A1c MFr Bld: 7.3 % — ABNORMAL HIGH (ref 4.6–6.5)

## 2024-07-31 LAB — VITAMIN B12: Vitamin B-12: 496 pg/mL (ref 211–911)

## 2024-07-31 MED ORDER — CYANOCOBALAMIN 1000 MCG/ML IJ SOLN
1000.0000 ug | Freq: Once | INTRAMUSCULAR | Status: AC
  Administered 2024-07-31: 1000 ug via INTRAMUSCULAR

## 2024-07-31 NOTE — Patient Instructions (Signed)
 Pending A1c, we can discuss switching from Januvia  (expensive) to a GLP-1 like Mounjaro, Ozempic, Trulicity - you can review the attached med information sheet and also see if your insurance can let you know what your copay might be.

## 2024-07-31 NOTE — Assessment & Plan Note (Signed)
 Encouraged follow-up with pulmonology provider to help with mask issues and better compliance of CPAP

## 2024-07-31 NOTE — Assessment & Plan Note (Signed)
 Injection today. Labs drawn prior to injection.

## 2024-07-31 NOTE — Assessment & Plan Note (Signed)
 Labs today. She is going to do some reading on GLP-1 and consider switching for better weight and DM management.

## 2024-07-31 NOTE — Progress Notes (Signed)
 Complete physical exam  Patient: Diana George   DOB: 05-Feb-1946   78 y.o. Female  MRN: [id number]  Subjective:    Chief Complaint  Patient presents with   Annual Exam    Diana George is a 78 y.o. female who presents today for a complete physical exam. She reports consuming a low carb diet. She tries to stay active with walking. She generally feels fairly well. She reports sleeping fairly well. She does not have additional problems to discuss today.   Currently lives with:  Acute concerns or interim problems since last visit:    Vision concerns: no Dental concerns: no   Discussed the use of AI scribe software for clinical note transcription with the patient, who gave verbal consent to proceed.  History of Present Illness MIRIANA George is a 78 year old female who presents for an annual physical exam.  She experiences significant difficulty sleeping. She uses a CPAP machine for sleep apnea but struggles with it staying on her head during the night, leading to dry mouth and snoring. She has tried different masks and adjustments without success. Her previous physician for this issue has retired, and she is in the process of finding a new provider. Reports that when she is able to wear the mask, she does seem to sleep better.   She has experienced balance issues over the past few weeks. Denies any neurologic symptoms or asymmetrical weakness. She has a history of falls but has not fallen in the past year. She is considering going to PT for balance/strength training.   She reports a weight gain of approximately 15-20 pounds over the last year and a half, which she attributes to a lack of exercise, as her diet has remained consistent. She is currently on Januvia  and glipizide  for diabetes management. Her A1c was previously 7.4.  She mentions a history of bathroom issues that have been resolved after pelvic floor physical therapy, and she has no current concerns with urination or bowel  movements.  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 She also had a mammo/US  and at the breast center, which was normal.           Wt Readings from Last 3 Encounters:  07/31/24 210 lb (95.3 kg)  06/10/24 209 lb 12.8 oz (95.2 kg)  01/29/24 208 lb (94.3 kg)      Most recent fall risk assessment:    07/31/2024   11:48 AM  Fall Risk   Falls in the past year? 0  Number falls in past yr: 0  Injury with Fall? 0  Risk for fall due to : No Fall Risks  Follow up Falls evaluation completed     Most recent depression screenings:    07/31/2024   11:48 AM 06/10/2024   11:22 AM  PHQ 2/9 Scores  PHQ - 2 Score 2 1  PHQ- 9 Score 7 7            Patient Care Team: Almarie Waddell NOVAK, NP as PCP - General (Family Medicine) [facility], [facility]   Outpatient Medications Prior to Visit  Medication Sig   Cholecalciferol (VITAMIN D ) 2000 UNITS CAPS Take 1 capsule by mouth 2 (two) times daily.   CYANOCOBALAMIN  IJ Inject as directed every 30 (thirty) days.   glipiZIDE  (GLUCOTROL ) 5 MG tablet Take 1 tablet (5 mg total) by mouth 2 (two)  times daily with breakfast and lunch.   hydrocortisone  (ANUSOL -HC) 2.5 % rectal cream Place 1 Application rectally 2 (two) times daily.   hyoscyamine  (LEVSIN ) 0.125 MG tablet Take 0.125 mg by mouth as needed.   propranolol  (INDERAL ) 20 MG tablet Take 1 tablet (20 mg total) by mouth 2 (two) times daily.   sertraline  (ZOLOFT ) 100 MG tablet Take 1 tablet (100 mg total) by mouth daily.   simvastatin  (ZOCOR ) 20 MG tablet Take 1 tablet (20 mg total) by mouth daily.   sitaGLIPtin  (JANUVIA ) 100 MG tablet Take 1 tablet (100 mg total) by mouth daily.   No facility-administered medications prior to visit.    ROS All review of systems negative except what is listed in the HPI        Objective:     BP 110/62   Pulse 67   Ht 5' 6 (1.676 m)   Wt  210 lb (95.3 kg)   SpO2 96%   BMI 33.89 kg/m    Physical Exam Vitals reviewed.  Constitutional:      General: She is not in acute distress.    Appearance: Normal appearance. She is not ill-appearing.  HENT:     Head: Normocephalic and atraumatic.     Right Ear: Tympanic membrane normal.     Left Ear: Tympanic membrane normal.     Nose: Nose normal.     Mouth/Throat:     Mouth: Mucous membranes are moist.     Pharynx: Oropharynx is clear.  Eyes:     Extraocular Movements: Extraocular movements intact.     Conjunctiva/sclera: Conjunctivae normal.     Pupils: Pupils are equal, round, and reactive to light.  Cardiovascular:     Rate and Rhythm: Normal rate and regular rhythm.     Pulses: Normal pulses.     Heart sounds: Normal heart sounds.  Pulmonary:     Effort: Pulmonary effort is normal.     Breath sounds: Normal breath sounds.  Abdominal:     General: Abdomen is flat. Bowel sounds are normal. There is no distension.     Palpations: Abdomen is soft. There is no mass.     Tenderness: There is no abdominal tenderness. There is no right CVA tenderness, left CVA tenderness, guarding or rebound.  Genitourinary:    Comments: Deferred exam Musculoskeletal:        General: Normal range of motion.     Cervical back: Normal range of motion and neck supple. No tenderness.     Right lower leg: No edema.     Left lower leg: No edema.  Lymphadenopathy:     Cervical: No cervical adenopathy.  Skin:    General: Skin is warm and dry.     Capillary Refill: Capillary refill takes less than 2 seconds.  Neurological:     General: No focal deficit present.     Mental Status: She is alert and oriented to person, place, and time. Mental status is at baseline.  Psychiatric:        Mood and Affect: Mood normal.        Behavior: Behavior normal.        Thought Content: Thought content normal.        Judgment: Judgment normal.        No results found for any visits on 07/31/24.      Assessment & Plan:    Routine Health Maintenance and Physical Exam Discussed health promotion and safety including diet and exercise recommendations, dental health,  and injury prevention. Tobacco cessation if applicable. Seat belts, sunscreen, smoke detectors, etc.    Immunization History  Administered Date(s) Administered   Influenza Split 08/09/2012, 08/30/2016   Influenza,inj,Quad PF,6+ Mos 08/27/2013, 08/19/2014, 08/06/2015, 08/07/2017, 08/16/2018, 07/09/2019, 08/04/2020, 08/22/2021, 12/06/2022   PFIZER Comirnaty(Gray Top)Covid-19 Tri-Sucrose Vaccine 05/26/2021   PFIZER(Purple Top)SARS-COV-2 Vaccination 11/21/2019, 12/12/2019, 08/19/2020, 05/26/2021   Pfizer Covid-19 Vaccine Bivalent Booster 64yrs & up 10/25/2021   Pneumococcal Conjugate-13 02/03/2016   Pneumococcal Polysaccharide-23 11/13/2009, 10/11/2017   Tdap 10/27/2011, 09/06/2023   Zoster Recombinant(Shingrix) 11/13/2018, 05/21/2020   Zoster, Live 12/06/2010    Health Maintenance  Topic Date Due   HEMOGLOBIN A1C  07/30/2024   OPHTHALMOLOGY EXAM  10/08/2024   Diabetic kidney evaluation - eGFR measurement  01/28/2025   Diabetic kidney evaluation - Urine ACR  06/10/2025   Medicare Annual Wellness (AWV)  06/10/2025   FOOT EXAM  07/31/2025   Pneumococcal Vaccine: 50+ Years  Completed   DEXA SCAN  Completed   Zoster Vaccines- Shingrix  Completed   HPV VACCINES  Aged Out   Meningococcal B Vaccine  Aged Out   DTaP/Tdap/Td  Discontinued   Influenza Vaccine  Discontinued   Mammogram  Discontinued   Colonoscopy  Discontinued   COVID-19 Vaccine  Discontinued   Hepatitis C Screening  Discontinued        Problem List Items Addressed This Visit       Active Problems   Diabetes mellitus (HCC)   Labs today. She is going to do some reading on GLP-1 and consider switching for better weight and DM management.       Relevant Orders   Hemoglobin A1c   Comprehensive metabolic panel with GFR   OSA (obstructive sleep apnea)    Encouraged follow-up with pulmonology provider to help with mask issues and better compliance of CPAP      B12 deficiency   Injection today. Labs drawn prior to injection.       Relevant Orders   Vitamin B12   Other Visit Diagnoses       Annual physical exam    -  Primary   Relevant Orders   Hemoglobin A1c   CBC with Differential/Platelet   Comprehensive metabolic panel with GFR   Lipid panel   TSH   Vitamin B12     Preventative health care       Relevant Orders   Hemoglobin A1c   CBC with Differential/Platelet   Comprehensive metabolic panel with GFR   Lipid panel   TSH   Vitamin B12     Hyperlipidemia associated with type 2 diabetes mellitus (HCC)       Relevant Orders   Lipid panel     Balance problem     No neurologic symptoms. She is thinking about going to PT for gail/balance - she will let me know. She will also monitor her symptoms and consider following up with her neurologist.         PATIENT COUNSELING:    Recommend that most people either abstain from alcohol or drink within safe limits (<=14/week and <=4 drinks/occasion for males, <=7/weeks and <= 3 drinks/occasion for females) and that the risk for alcohol disorders and other health effects rises proportionally with the number of drinks per week and how often a drinker exceeds daily limits.   Diet: Recommend to adjust caloric intake to maintain or achieve ideal body weight, to reduce intake of dietary saturated fat and total fat, to limit sodium intake by avoiding high sodium foods  and not adding table salt, and to maintain adequate dietary potassium and calcium preferably from fresh fruits, vegetables, and low-fat dairy products.   Emphasized the importance of regular exercise.  Injury prevention: Recommend seatbelts, safety helmets, smoke detector, etc..   Dental health: Recommend regular tooth brushing, flossing, and dental visits.       Return in about 6 months (around 01/29/2025) for chronic  disease management.     Waddell KATHEE Mon, NP

## 2024-08-01 ENCOUNTER — Ambulatory Visit: Payer: Self-pay | Admitting: Family Medicine

## 2024-08-04 NOTE — Progress Notes (Signed)
 03/05/23- 78 yoF never smoker with OSA, complicated by Allergic Rhinitis, Vasomotor Rhinitis,  DM, Hyperlipidemia, Anxiety/Depression, CAD, Irritable Bowel,  Followed by Dr Neda LOV 01/19/21---------> Diagnosis obstructive sleep apnea in 2005 Has been using CPAP since then Repeat study in 2015 did reveal severe obstructive sleep apnea, initial study was moderate obstructive sleep apnea CPAP auto 5-12/ [facility]  assisted with replacement  set-up 01/18/23 Download compliance-97%, AHI 3.4/ hr Body weight today-193 lbs ED 02/13/23- Psychiatry for hypnagogic hallucination.  She says this was caused by occasions and GI upset and now resolved. Complains of issues with cpap mask- states she can't keep it on her head.  Complains of service and asks change of DME company. Download reviewed.   08/05/24- 78 yoF never smoker with OSA, complicated by Allergic Rhinitis, Vasomotor Rhinitis,  DM, Hyperlipidemia, Anxiety/Depression, CAD, Irritable Bowel,  Followed by Dr Neda LOV 01/19/21---------> Diagnosis obstructive sleep apnea in 2005 Has been using CPAP since then Repeat study in 2015 did reveal severe obstructive sleep apnea, initial study was moderate obstructive sleep apnea CPAP auto 5-12/ [facility]  assisted with replacement  set-up 01/18/23 Download compliance-60%, AHI 4/hr Body weight today-211 lbs Discussed the use of AI scribe software for clinical note transcription with the patient, who gave verbal consent to proceed.  History of Present Illness   Diana George is a 78 year old female who presents with sleep disturbances and CPAP issues.  She experiences difficulty falling and staying asleep, often waking around 4 AM, over the past three to six months. Not clear why it should have come on She is not on sleep medication but is considering it . Her CPAP machine is set between 5-12 cm H2O, usually operating at 9-10 cm H2O. She sometimes does not use the CPAP for the full four hours due to  discomfort or forgetting. The CPAP mask does not stay on her head, possibly due to movement during sleep, but she does not feel physically uncomfortable in bed. She needs new CPAP supplies and seeks a new supplier in the [place] area.     Assessment and Plan:    Obstructive sleep apnea syndrome Obstructive sleep apnea well-managed with CPAP. Current settings effective with less than five apneas per hour. Issues with CPAP supply provider noted. She asks to change from [facility]. - Ensure CPAP settings remain appropriate. - Assist in finding a new CPAP supply provider in [facility] area.  Insomnia Chronic insomnia with difficulty falling asleep and early morning awakenings. CPAP mask displacement noted. Trazodone recommended as a non-narcotic option. - Prescribe trazodone for sleep, use as needed, monitor for side effects. - Advise discontinuation of trazodone if adverse effects occur, report for alternatives.     ROS-see HPI   + = positive Constitutional:    weight loss, night sweats, fevers, chills, fatigue, lassitude. HEENT:    headaches, difficulty swallowing, tooth/dental problems, sore throat,       sneezing, itching, ear ache, nasal congestion, +post nasal drip, snoring CV:    chest pain, orthopnea, PND, swelling in lower extremities, anasarca,                                   dizziness, palpitations Resp:   shortness of breath with exertion or at rest.                productive cough,   non-productive cough, coughing up of blood.  change in color of mucus.  wheezing.   Skin:    rash or lesions. GI:  No-   heartburn, indigestion, abdominal pain, nausea, vomiting, diarrhea,                 change in bowel habits, loss of appetite GU: dysuria, change in color of urine, no urgency or frequency.   flank pain. MS:   joint pain, stiffness, decreased range of motion, back pain. Neuro-     nothing unusual Psych:  change in mood or affect.  depression or anxiety.   memory  loss.'  OBJ- Physical Exam General- Alert, Oriented, Affect-appropriate, Distress- none acute Skin- rash-none, lesions- none, excoriation- none Lymphadenopathy- none Head- atraumatic            Eyes- Gross vision intact, PERRLA, conjunctivae and secretions clear            Ears- Hearing, canals-normal            Nose- Clear, no-Septal dev, mucus, polyps, erosion, perforation             Throat- Mallampati IV , mucosa clear , drainage- none, tonsils- atrophic, +teeth Neck- flexible , trachea midline, no stridor , thyroid  nl, carotid no bruit Chest - symmetrical excursion , unlabored           Heart/CV- RRR , no murmur , no gallop  , no rub, nl s1 s2                           - JVD- none , edema- none, stasis changes- none, varices- none           Lung- clear to P&A, wheeze- none, cough- none , dullness-none, rub- none           Chest wall-  Abd-  Br/ Gen/ Rectal- Not done, not indicated Extrem- cyanosis- none, clubbing, none, atrophy- none, strength- nl Neuro-+ slight resting tremor fingers especially left hand

## 2024-08-05 ENCOUNTER — Other Ambulatory Visit (HOSPITAL_BASED_OUTPATIENT_CLINIC_OR_DEPARTMENT_OTHER): Payer: Self-pay

## 2024-08-05 ENCOUNTER — Encounter: Payer: Self-pay | Admitting: Internal Medicine

## 2024-08-05 ENCOUNTER — Ambulatory Visit (INDEPENDENT_AMBULATORY_CARE_PROVIDER_SITE_OTHER): Admitting: Internal Medicine

## 2024-08-05 VITALS — BP 122/64 | HR 65 | Temp 98.8°F | Ht 66.0 in | Wt 211.0 lb

## 2024-08-05 DIAGNOSIS — G4733 Obstructive sleep apnea (adult) (pediatric): Secondary | ICD-10-CM

## 2024-08-05 DIAGNOSIS — G4700 Insomnia, unspecified: Secondary | ICD-10-CM

## 2024-08-05 MED ORDER — TRAZODONE HCL 50 MG PO TABS
50.0000 mg | ORAL_TABLET | Freq: Every day | ORAL | 3 refills | Status: DC
  Filled 2024-08-05: qty 30, 30d supply, fill #0
  Filled 2024-09-08: qty 30, 30d supply, fill #1
  Filled 2024-10-06: qty 30, 30d supply, fill #2
  Filled 2024-11-03: qty 30, 30d supply, fill #3

## 2024-08-05 NOTE — Patient Instructions (Addendum)
  Order- Patient requests DME change from [facility]. Lives in Home, to continue CPAP care. We can continue CPAP auto 5-12, mask of choice, humidifier, supplies, AirView/ card.   Script sent to try trazodone at bedtime for sleep.

## 2024-08-18 ENCOUNTER — Other Ambulatory Visit (HOSPITAL_BASED_OUTPATIENT_CLINIC_OR_DEPARTMENT_OTHER): Payer: Self-pay

## 2024-08-18 MED ORDER — FLUZONE HIGH-DOSE 0.5 ML IM SUSY
0.5000 mL | PREFILLED_SYRINGE | Freq: Once | INTRAMUSCULAR | 0 refills | Status: AC
  Filled 2024-08-18: qty 0.5, 1d supply, fill #0

## 2024-08-19 ENCOUNTER — Telehealth: Payer: Self-pay

## 2024-08-19 NOTE — Telephone Encounter (Signed)
 Copied from CRM #[id number]. Topic: Clinical - Order For Equipment >> Aug 18, 2024  9:55 AM Diana George wrote: Reason for CRM: Patient is calling to inquire about why CPAP has not been transferred to a new DME in [facility]. Informed patient that an order was placed but the destination was not indicated. Patient would like to know what is taking so long. Please update patient.

## 2024-08-20 NOTE — Telephone Encounter (Signed)
 Sent to [facility] as urgent.

## 2024-08-20 NOTE — Telephone Encounter (Signed)
 I have left Tammy at [facility] a message in regards to this

## 2024-08-22 NOTE — Progress Notes (Signed)
 Diana George                                          MRN: [id number]   08/22/2024   The [facility] Quality Team Specialist reviewed this patient medical record for the purposes of chart review for care gap closure. The following were reviewed: abstraction for care gap closure-kidney health evaluation for diabetes:eGFR  and uACR.    [facility] Quality Team

## 2024-08-25 ENCOUNTER — Other Ambulatory Visit: Payer: Self-pay | Admitting: Family Medicine

## 2024-08-25 ENCOUNTER — Telehealth: Payer: Self-pay | Admitting: *Deleted

## 2024-08-25 ENCOUNTER — Other Ambulatory Visit (HOSPITAL_BASED_OUTPATIENT_CLINIC_OR_DEPARTMENT_OTHER): Payer: Self-pay

## 2024-08-25 DIAGNOSIS — E1165 Type 2 diabetes mellitus with hyperglycemia: Secondary | ICD-10-CM

## 2024-08-25 MED ORDER — SITAGLIPTIN PHOSPHATE 100 MG PO TABS
100.0000 mg | ORAL_TABLET | Freq: Every day | ORAL | 0 refills | Status: DC
  Filled 2024-08-25: qty 90, 90d supply, fill #0

## 2024-08-25 NOTE — Telephone Encounter (Signed)
 Copied from CRM [id number]. Topic: Clinical - Order For Equipment >> Aug 25, 2024 10:41 AM Devaughn RAMAN wrote: Reason for CRM: Patient called in regards to CPAP supplies, advised pt of message regarding DME providers. Patient stated she would like a callback regarding supplies and where exactly they are going to be coming from.  I called and spoke with patient, I let her know that the order for her CPAP supplies went to [facility] and gave her the local number to call to order supplies.  I let her know that they did confirm that they did receive the order.  I advised her that if she does not want supplies automatically shipped to her to let them know that you will call whenyou need supplies.  She verbalized understanding.  Nothing further needed.

## 2024-09-02 ENCOUNTER — Telehealth: Payer: Self-pay

## 2024-09-02 NOTE — Telephone Encounter (Signed)
 Copied from CRM [id number]. Topic: Clinical - Request for Lab/Test Order >> Sep 02, 2024 12:18 PM Eva FALCON wrote: Reason for CRM: pt was calling to schedule her b12, state her last one was back on 10/2. I did not see an outstanding order for one. Could someone confirm if an order is in and get her scheduled?

## 2024-09-02 NOTE — Telephone Encounter (Signed)
 Please advise? Is Pt supposed to have monthly b12 injections?

## 2024-09-04 NOTE — Telephone Encounter (Signed)
 Yes, she needs to continue monthly B12 injections. Thank you

## 2024-09-05 ENCOUNTER — Telehealth: Payer: Self-pay

## 2024-09-05 NOTE — Telephone Encounter (Signed)
 Spoke w/ Pt- nurse visit scheduled.

## 2024-09-05 NOTE — Telephone Encounter (Signed)
 Copied from CRM #[id number]. Topic: Clinical - Order For Equipment >> Sep 05, 2024  2:01 PM Devaughn RAMAN wrote: Reason for CRM: Pt called and stated [facility] called her and advised she needed a prescription/order and it needed to specify if you needed supplies or a machine. Pt stated on the order it needs to state the mask fitting because the one she has now keeps falling off.

## 2024-09-08 ENCOUNTER — Other Ambulatory Visit (HOSPITAL_BASED_OUTPATIENT_CLINIC_OR_DEPARTMENT_OTHER): Payer: Self-pay

## 2024-09-08 ENCOUNTER — Other Ambulatory Visit: Payer: Self-pay

## 2024-09-08 ENCOUNTER — Ambulatory Visit

## 2024-09-08 DIAGNOSIS — E538 Deficiency of other specified B group vitamins: Secondary | ICD-10-CM | POA: Diagnosis not present

## 2024-09-08 MED ORDER — CYANOCOBALAMIN 1000 MCG/ML IJ SOLN
1000.0000 ug | Freq: Once | INTRAMUSCULAR | Status: AC
  Administered 2024-09-08: 1000 ug via INTRAMUSCULAR

## 2024-09-08 NOTE — Progress Notes (Signed)
    Pt here for monthly B12 injection per original order dated: 07/31/23: Vitamin B12 deficiency: monthly injections, due today, doing well, no concerns.    Last B12 injection:07/31/2024   B12 1000mcg given IM, and pt tolerated injection well.   Next B12 injection will be administered during physical with provider on 10/09/24.

## 2024-09-09 ENCOUNTER — Other Ambulatory Visit: Payer: Self-pay

## 2024-09-09 DIAGNOSIS — G4733 Obstructive sleep apnea (adult) (pediatric): Secondary | ICD-10-CM

## 2024-09-09 NOTE — Telephone Encounter (Signed)
 Spoke with patient Diana George order placed   -NFN

## 2024-09-30 ENCOUNTER — Telehealth: Payer: Self-pay | Admitting: Physician Assistant

## 2024-09-30 NOTE — Telephone Encounter (Signed)
 Inbound call from patient stating that she is currently having uncontrollable  diarrhea and is requesting a call to discuss recommendations. Please advise.

## 2024-10-01 ENCOUNTER — Other Ambulatory Visit: Payer: Self-pay

## 2024-10-01 DIAGNOSIS — R197 Diarrhea, unspecified: Secondary | ICD-10-CM

## 2024-10-01 DIAGNOSIS — R152 Fecal urgency: Secondary | ICD-10-CM

## 2024-10-01 NOTE — Telephone Encounter (Addendum)
 Having diarrhea a lot. Yesterday she had diarrhea 3 times and was incontinent each time. She cannot pass gas with confidence. She has had this for a week. States she has this issue ongoing but not usually this severe. She does not take any fiber supplements. No recent antibiotics or changes in her medications.   Message routed to [employer] A App.

## 2024-10-01 NOTE — Progress Notes (Signed)
 Patient advised and agrees to this plan of care. She will come to the lab tomorrow. She will begin a daily fiber supplement.

## 2024-10-01 NOTE — Telephone Encounter (Signed)
 Inbound call from patient returning a call back to beth. Patient is requesting a call back.Please advise.

## 2024-10-01 NOTE — Telephone Encounter (Signed)
 Returned the patient's call. No answer. Left message of my call.

## 2024-10-02 ENCOUNTER — Other Ambulatory Visit

## 2024-10-02 DIAGNOSIS — M816 Localized osteoporosis [Lequesne]: Secondary | ICD-10-CM | POA: Diagnosis not present

## 2024-10-03 ENCOUNTER — Other Ambulatory Visit

## 2024-10-03 DIAGNOSIS — R152 Fecal urgency: Secondary | ICD-10-CM | POA: Diagnosis not present

## 2024-10-03 DIAGNOSIS — R197 Diarrhea, unspecified: Secondary | ICD-10-CM

## 2024-10-03 DIAGNOSIS — R159 Full incontinence of feces: Secondary | ICD-10-CM | POA: Diagnosis not present

## 2024-10-05 LAB — CLOSTRIDIUM DIFFICILE BY PCR: Toxigenic C. Difficile by PCR: NEGATIVE

## 2024-10-06 ENCOUNTER — Ambulatory Visit: Payer: Self-pay | Admitting: Gastroenterology

## 2024-10-06 ENCOUNTER — Other Ambulatory Visit: Payer: Self-pay | Admitting: Family Medicine

## 2024-10-06 DIAGNOSIS — R159 Full incontinence of feces: Secondary | ICD-10-CM

## 2024-10-06 DIAGNOSIS — G250 Essential tremor: Secondary | ICD-10-CM

## 2024-10-06 DIAGNOSIS — K5900 Constipation, unspecified: Secondary | ICD-10-CM

## 2024-10-06 LAB — GI PROFILE, STOOL, PCR

## 2024-10-07 ENCOUNTER — Ambulatory Visit
Admission: RE | Admit: 2024-10-07 | Discharge: 2024-10-07 | Disposition: A | Discharge status: Home / Self Care | Source: Ambulatory Visit | Attending: Gastroenterology | Admitting: Gastroenterology

## 2024-10-07 ENCOUNTER — Other Ambulatory Visit (HOSPITAL_BASED_OUTPATIENT_CLINIC_OR_DEPARTMENT_OTHER): Payer: Self-pay

## 2024-10-07 ENCOUNTER — Other Ambulatory Visit: Payer: Self-pay

## 2024-10-07 DIAGNOSIS — R197 Diarrhea, unspecified: Secondary | ICD-10-CM | POA: Diagnosis not present

## 2024-10-07 DIAGNOSIS — K5900 Constipation, unspecified: Secondary | ICD-10-CM

## 2024-10-07 DIAGNOSIS — R152 Fecal urgency: Secondary | ICD-10-CM | POA: Diagnosis not present

## 2024-10-07 DIAGNOSIS — R159 Full incontinence of feces: Secondary | ICD-10-CM | POA: Diagnosis not present

## 2024-10-07 MED ORDER — SERTRALINE HCL 100 MG PO TABS
100.0000 mg | ORAL_TABLET | Freq: Every day | ORAL | 1 refills | Status: AC
  Filled 2024-10-07: qty 90, 90d supply, fill #0

## 2024-10-07 MED ORDER — PROPRANOLOL HCL 20 MG PO TABS
20.0000 mg | ORAL_TABLET | Freq: Two times a day (BID) | ORAL | 1 refills | Status: AC
  Filled 2024-10-07: qty 180, 90d supply, fill #0

## 2024-10-09 ENCOUNTER — Ambulatory Visit: Admitting: *Deleted

## 2024-10-09 DIAGNOSIS — E538 Deficiency of other specified B group vitamins: Secondary | ICD-10-CM | POA: Diagnosis not present

## 2024-10-09 DIAGNOSIS — H524 Presbyopia: Secondary | ICD-10-CM | POA: Diagnosis not present

## 2024-10-09 DIAGNOSIS — K080 Exfoliation of teeth due to systemic causes: Secondary | ICD-10-CM | POA: Diagnosis not present

## 2024-10-09 LAB — OPHTHALMOLOGY REPORT-SCANNED

## 2024-10-09 MED ORDER — CYANOCOBALAMIN 1000 MCG/ML IJ SOLN
1000.0000 ug | Freq: Once | INTRAMUSCULAR | Status: AC
  Administered 2024-10-09: 1000 ug via INTRAMUSCULAR

## 2024-10-09 NOTE — Progress Notes (Signed)
 Patient here for monthly b12 injection per physicians order.  Injection given in left deltoid and patient tolerated well.

## 2024-10-14 ENCOUNTER — Ambulatory Visit: Payer: Self-pay | Admitting: Gastroenterology

## 2024-11-03 ENCOUNTER — Other Ambulatory Visit (HOSPITAL_BASED_OUTPATIENT_CLINIC_OR_DEPARTMENT_OTHER): Payer: Self-pay

## 2024-11-03 DIAGNOSIS — E1165 Type 2 diabetes mellitus with hyperglycemia: Secondary | ICD-10-CM

## 2024-11-03 MED ORDER — GLIPIZIDE 5 MG PO TABS
5.0000 mg | ORAL_TABLET | Freq: Two times a day (BID) | ORAL | 0 refills | Status: AC
  Filled 2024-11-03: qty 180, 90d supply, fill #0

## 2024-11-11 ENCOUNTER — Ambulatory Visit: Admitting: *Deleted

## 2024-11-11 DIAGNOSIS — E538 Deficiency of other specified B group vitamins: Secondary | ICD-10-CM

## 2024-11-11 MED ORDER — CYANOCOBALAMIN 1000 MCG/ML IJ SOLN
1000.0000 ug | Freq: Once | INTRAMUSCULAR | Status: AC
  Administered 2024-11-11: 1000 ug via INTRAMUSCULAR

## 2024-11-11 NOTE — Progress Notes (Signed)
 Pt here for monthly b12 injection per PCP order. Given LD without complication.   Next injection scheduled for 12/12/24.

## 2024-12-02 ENCOUNTER — Other Ambulatory Visit (HOSPITAL_BASED_OUTPATIENT_CLINIC_OR_DEPARTMENT_OTHER): Payer: Self-pay

## 2024-12-02 ENCOUNTER — Other Ambulatory Visit: Payer: Self-pay | Admitting: Internal Medicine

## 2024-12-02 ENCOUNTER — Other Ambulatory Visit: Payer: Self-pay

## 2024-12-02 ENCOUNTER — Other Ambulatory Visit: Payer: Self-pay | Admitting: Family Medicine

## 2024-12-02 DIAGNOSIS — E1165 Type 2 diabetes mellitus with hyperglycemia: Secondary | ICD-10-CM

## 2024-12-02 MED ORDER — TRAZODONE HCL 50 MG PO TABS
50.0000 mg | ORAL_TABLET | Freq: Every day | ORAL | 3 refills | Status: AC
  Filled 2024-12-02: qty 30, 30d supply, fill #0

## 2024-12-02 MED ORDER — SITAGLIPTIN PHOSPHATE 100 MG PO TABS
100.0000 mg | ORAL_TABLET | Freq: Every day | ORAL | 0 refills | Status: AC
  Filled 2024-12-02: qty 90, 90d supply, fill #0

## 2024-12-03 ENCOUNTER — Encounter: Admitting: Primary Care

## 2024-12-12 ENCOUNTER — Ambulatory Visit

## 2024-12-17 ENCOUNTER — Encounter: Admitting: Primary Care

## 2025-01-12 ENCOUNTER — Encounter: Admitting: Primary Care

## 2025-05-05 ENCOUNTER — Ambulatory Visit: Admitting: Family Medicine

## 2025-06-12 ENCOUNTER — Ambulatory Visit
